# Patient Record
Sex: Female | Born: 1940
Health system: Southern US, Community
[De-identification: ages and names within clinical notes are randomized; demographics above are authoritative.]

## PROBLEM LIST (undated history)

## (undated) DIAGNOSIS — H9319 Tinnitus, unspecified ear: Secondary | ICD-10-CM

## (undated) DIAGNOSIS — Z973 Presence of spectacles and contact lenses: Secondary | ICD-10-CM

## (undated) DIAGNOSIS — R002 Palpitations: Secondary | ICD-10-CM

## (undated) DIAGNOSIS — C50919 Malignant neoplasm of unspecified site of unspecified female breast: Secondary | ICD-10-CM

## (undated) DIAGNOSIS — I10 Essential (primary) hypertension: Secondary | ICD-10-CM

## (undated) HISTORY — PX: TEAR DUCT PROBING: SHX793

## (undated) HISTORY — PX: CHOLECYSTECTOMY: SHX55

## (undated) HISTORY — DX: Essential (primary) hypertension: I10

## (undated) HISTORY — PX: BREAST LUMPECTOMY: SHX2

## (undated) HISTORY — PX: BREAST SURGERY: SHX581

## (undated) HISTORY — DX: Malignant neoplasm of unspecified site of unspecified female breast: C50.919

## (undated) HISTORY — DX: Presence of spectacles and contact lenses: Z97.3

## (undated) HISTORY — DX: Palpitations: R00.2

## (undated) HISTORY — DX: Tinnitus, unspecified ear: H93.19

---

## 1997-10-04 ENCOUNTER — Other Ambulatory Visit: Admission: RE | Admit: 1997-10-04 | Discharge: 1997-10-04 | Payer: Self-pay | Admitting: Obstetrics and Gynecology

## 1998-11-01 ENCOUNTER — Other Ambulatory Visit: Admission: RE | Admit: 1998-11-01 | Discharge: 1998-11-01 | Payer: Self-pay | Admitting: Obstetrics and Gynecology

## 2000-02-20 ENCOUNTER — Other Ambulatory Visit: Admission: RE | Admit: 2000-02-20 | Discharge: 2000-02-20 | Payer: Self-pay | Admitting: Obstetrics and Gynecology

## 2001-03-31 ENCOUNTER — Other Ambulatory Visit: Admission: RE | Admit: 2001-03-31 | Discharge: 2001-03-31 | Payer: Self-pay | Admitting: Obstetrics and Gynecology

## 2002-04-14 ENCOUNTER — Other Ambulatory Visit: Admission: RE | Admit: 2002-04-14 | Discharge: 2002-04-14 | Payer: Self-pay | Admitting: Obstetrics and Gynecology

## 2002-07-19 HISTORY — PX: CARDIOVASCULAR STRESS TEST: SHX262

## 2003-05-04 ENCOUNTER — Other Ambulatory Visit: Admission: RE | Admit: 2003-05-04 | Discharge: 2003-05-04 | Payer: Self-pay | Admitting: Obstetrics and Gynecology

## 2004-04-05 ENCOUNTER — Ambulatory Visit (HOSPITAL_COMMUNITY): Admission: RE | Admit: 2004-04-05 | Discharge: 2004-04-05 | Payer: Self-pay | Admitting: Gastroenterology

## 2004-05-06 ENCOUNTER — Other Ambulatory Visit: Admission: RE | Admit: 2004-05-06 | Discharge: 2004-05-06 | Payer: Self-pay | Admitting: Obstetrics and Gynecology

## 2005-06-02 ENCOUNTER — Other Ambulatory Visit: Admission: RE | Admit: 2005-06-02 | Discharge: 2005-06-02 | Payer: Self-pay | Admitting: Obstetrics and Gynecology

## 2007-01-29 ENCOUNTER — Inpatient Hospital Stay (HOSPITAL_BASED_OUTPATIENT_CLINIC_OR_DEPARTMENT_OTHER): Admission: RE | Admit: 2007-01-29 | Discharge: 2007-01-29 | Payer: Self-pay | Admitting: Cardiology

## 2007-01-29 HISTORY — PX: CARDIAC CATHETERIZATION: SHX172

## 2008-12-22 ENCOUNTER — Encounter: Admission: RE | Admit: 2008-12-22 | Discharge: 2008-12-22 | Payer: Self-pay | Admitting: Otolaryngology

## 2009-07-23 ENCOUNTER — Encounter: Admission: RE | Admit: 2009-07-23 | Discharge: 2009-07-23 | Payer: Self-pay | Admitting: Otolaryngology

## 2010-01-04 ENCOUNTER — Ambulatory Visit: Payer: Self-pay | Admitting: Cardiovascular Disease

## 2010-05-26 DIAGNOSIS — C50919 Malignant neoplasm of unspecified site of unspecified female breast: Secondary | ICD-10-CM

## 2010-05-26 HISTORY — DX: Malignant neoplasm of unspecified site of unspecified female breast: C50.919

## 2010-08-07 ENCOUNTER — Other Ambulatory Visit: Payer: Self-pay | Admitting: Otolaryngology

## 2010-08-07 DIAGNOSIS — E041 Nontoxic single thyroid nodule: Secondary | ICD-10-CM

## 2010-08-12 ENCOUNTER — Ambulatory Visit
Admission: RE | Admit: 2010-08-12 | Discharge: 2010-08-12 | Disposition: A | Discharge status: Home / Self Care | Payer: Medicare Other | Source: Ambulatory Visit | Attending: Otolaryngology | Admitting: Otolaryngology

## 2010-08-12 DIAGNOSIS — E041 Nontoxic single thyroid nodule: Secondary | ICD-10-CM

## 2010-09-26 ENCOUNTER — Ambulatory Visit: Payer: Self-pay | Admitting: *Deleted

## 2010-10-08 NOTE — Cardiovascular Report (Signed)
NAMEAEON, KOORS NO.:  [id number]   MEDICAL RECORD NO.:  [id number]          PATIENT TYPE:  OIB   LOCATION:  1965                         FACILITY:  [facility]   PHYSICIAN:  Colleen Can. Deborah Chalk, M.D.DATE OF BIRTH:  [birth year]   DATE OF PROCEDURE:  01/29/2007  DATE OF DISCHARGE:                            CARDIAC CATHETERIZATION   PROCEDURE:  Left heart catheterization with selective coronary  angiography and left ventricular angiography.   TYPE/SITE OF ENTRY:  Percutaneous right femoral artery.   CATHETERS:  4-French, four curved Judkins right and left coronary  catheters; 4-French, pigtail ventriculographic catheter.   CONTRAST MATERIAL:  Omnipaque.   COMMENTS:  The patient tolerated the procedure well.   HEMODYNAMIC DATA:  The aortic pressure was 136/62, LV is 137/2-4.  There  is no aortic valve gradient noted on pullback.   ANGIOGRAPHIC DATA:  Left main coronary artery is normal.  There is  normal global function, regional wall motion and cardiac size.  The  global ejection fraction was estimated at 60-65%.   CORONARY ARTERIES:  Coronary arteries arise and distribute normally.  1. Left main coronary artery is short.  2. The left anterior descending is normal.  It is a somewhat tortuous      vessel with high first and second diagonal vessels.  There is no      significant focal obstructive disease.  3. The left circumflex appears to be a dual ostium with the left      anterior descending.  It is normal.  It essentially trifurcates      with two large obtuse marginal branches.  4. Right coronary artery.  The right coronary artery is congenitally      small.  It is normal.  5. Abdominal aortogram shows normal distal abdominal aorta.  There is      normal renal arteries.   IMPRESSION:  1. Normal left ventricular function.  2. Normal coronary arteries with congenitally nondominant right      coronary artery.  3. It is a normal abdominal  aortogram.      Colleen Can. Deborah Chalk, M.D.  Electronically Signed     SNT/MEDQ  D:  01/29/2007  T:  01/29/2007  Job:  [id number]

## 2010-10-08 NOTE — H&P (Signed)
Deborah Lowe, Deborah Lowe NO.:  [id number]   MEDICAL RECORD NO.:  [id number]           PATIENT TYPE:   LOCATION:                                 FACILITY:   PHYSICIAN:  Colleen Can. Deborah Chalk, M.D.DATE OF BIRTH:  11-12-1940   DATE OF ADMISSION:  01/29/2007  DATE OF DISCHARGE:                              HISTORY & PHYSICAL   CHIEF COMPLAINT:  Chest discomfort.   HISTORY OF PRESENT ILLNESS:  Deborah Lowe is a 70 year old white female who  has a history of palpitations as well as labile hypertension.  She has  also had a vague fullness in the anterior chest, but has continued to be  active in taking care of her house and her yard.  She was referred for  stress echocardiogram which was performed on January 27, 2007.  With  this, she demonstrated reduced exercise tolerance.  She was only able to  exercise for approximately 5 minutes on the standard Bruce protocol to a  maximum of 2.5 miles per hour a 12% grade.  Her blood pressure rose to  155/70.  Her heart rate rose to 154.  Clinically, she had complaints of  fatigue.  She was noted to have diffuse ST depression as well as  occasional PVCs.  Echocardiographically, she demonstrated LVH.  She is  now referred for cardiac catheterization.   PAST MEDICAL HISTORY:  1. Palpitations.  2. Labile hypertension.  3. History of pneumonia.  4. Previous surgery for a clogged tear duct in the right eye.  5. Chest discomfort.  6. History of cholecystectomy over 30 years ago.  7. Childbirth times one.  8. Hyperlipidemia.   ALLERGIES:  Avelox.   CURRENT MEDICATIONS:  1. Toprol XL 25 mg a day.  2. Centrum vitamin daily.   FAMILY HISTORY:  Father died at 49 with heart disease.  Mother died at  40 with hypertension.  She has two sisters and one brother.  One brother  has had hypertension as well.   SOCIAL HISTORY:  She lives at home with her husband.  She is a  [employer] trader.   REVIEW OF SYSTEMS:  As noted above and is otherwise  unremarkable.   PHYSICAL EXAMINATION:  GENERAL:  She is a pleasant white female in no  acute distress.  VITAL SIGNS:  Blood pressure was 140/96, weight was 118 pounds, heart  rate 62.  HEENT:  Exam was unremarkable.  NECK: Supple.  LUNGS: Clear.  HEART: Showed regular rhythm.  ABDOMEN: Soft.  EXTREMITIES:  Without edema.  NEUROLOGIC:  Showed no gross focal deficit.   PERTINENT LABORATORY DATA:  Previous cholesterol levels showed total  cholesterol of 211, LDL was 130, HDL was 64.  Chest x-ray showed no  active disease.  Other labs are pending.   OVERALL IMPRESSION:  1. Abnormal stress echocardiogram with positive EKG changes.  2. Hypertension.  She does have mild LVH on 2-D echocardiogram.  3. History of palpitations.   PLAN:  Will proceed on with diagnostic catheterization.  The procedure,  risks and benefits have all been explained, and she is willing to  proceed on January 29, 2007.      Sharlee Blew, N.P.      Colleen Can. Deborah Chalk, M.D.  Electronically Signed    LC/MEDQ  D:  01/27/2007  T:  01/28/2007  Job:  [id number]   cc:   Colon Flattery, D.O.

## 2010-10-11 NOTE — Op Note (Signed)
Deborah Lowe, Deborah Lowe                ACCOUNT NO.:  [id number]   MEDICAL RECORD NO.:  [id number]          PATIENT TYPE:  AMB   LOCATION:  ENDO                         FACILITY:  [facility]   PHYSICIAN:  James L. Malon Kindle., M.D.DATE OF BIRTH:  1940-09-30   DATE OF PROCEDURE:  04/05/2004  DATE OF DISCHARGE:                                 OPERATIVE REPORT   PROCEDURE:  Colonoscopy.   MEDICATIONS:  1.  Fentanyl 75 mcg.  2.  Versed 7 mg IV.   SCOPE:  Olympus pediatric adjustable colonoscope.   INDICATION:  Strong family history of colon cancer.  Mother died of colon  cancer.  Sister has had colon polyps.   DESCRIPTION OF PROCEDURE:  The procedure had been explained to the patient  and consent obtained.  With the patient in left lateral decubitus position,  the Olympus scope was inserted and advanced.  The prep was excellent.  We  were able to easily advance to the cecum.  The ileocecal valve and  appendiceal orifice were seen.  The scope was withdrawn, and the cecum,  ascending colon, transverse colon, descending and sigmoid colon were seen  well.  There was minimal diverticular disease.  The rectum was free of  polyps.  The scope was withdrawn.  The patient tolerated the procedure well.   ASSESSMENT:  Family history of colon cancer.  Negative colonoscopy. V16.0.   PLAN:  We will repeat colonoscopy in 5 years.  Recommend yearly Hemoccults.      JLE/MEDQ  D:  04/05/2004  T:  04/05/2004  Job:  [id number]   cc:   Gloriajean Dell. Andrey Campanile, M.D.  [address]  [place]  [place] [address]  Fax: [phone]

## 2010-12-04 ENCOUNTER — Other Ambulatory Visit: Payer: Self-pay | Admitting: Radiology

## 2010-12-05 ENCOUNTER — Other Ambulatory Visit: Payer: Self-pay | Admitting: Radiology

## 2010-12-05 DIAGNOSIS — C50912 Malignant neoplasm of unspecified site of left female breast: Secondary | ICD-10-CM

## 2010-12-09 ENCOUNTER — Ambulatory Visit
Admission: RE | Admit: 2010-12-09 | Discharge: 2010-12-09 | Disposition: A | Discharge status: Home / Self Care | Payer: Medicare Other | Source: Ambulatory Visit | Attending: Radiology | Admitting: Radiology

## 2010-12-09 DIAGNOSIS — C50912 Malignant neoplasm of unspecified site of left female breast: Secondary | ICD-10-CM

## 2010-12-09 MED ORDER — GADOBENATE DIMEGLUMINE 529 MG/ML IV SOLN: 10.0000 mL | Freq: Once | INTRAVENOUS | Status: AC | PRN

## 2010-12-11 ENCOUNTER — Encounter (HOSPITAL_BASED_OUTPATIENT_CLINIC_OR_DEPARTMENT_OTHER): Payer: Medicare Other | Admitting: Oncology

## 2010-12-11 ENCOUNTER — Other Ambulatory Visit: Payer: Self-pay | Admitting: Oncology

## 2010-12-11 ENCOUNTER — Encounter (INDEPENDENT_AMBULATORY_CARE_PROVIDER_SITE_OTHER): Payer: Self-pay | Admitting: General Surgery

## 2010-12-11 ENCOUNTER — Ambulatory Visit (HOSPITAL_BASED_OUTPATIENT_CLINIC_OR_DEPARTMENT_OTHER): Payer: Medicare Other | Admitting: General Surgery

## 2010-12-11 VITALS — BP 188/77 | HR 66 | Temp 97.9°F | Resp 20 | Ht 61.0 in | Wt 116.0 lb

## 2010-12-11 DIAGNOSIS — C50919 Malignant neoplasm of unspecified site of unspecified female breast: Secondary | ICD-10-CM

## 2010-12-11 DIAGNOSIS — C50419 Malignant neoplasm of upper-outer quadrant of unspecified female breast: Secondary | ICD-10-CM

## 2010-12-11 DIAGNOSIS — D0590 Unspecified type of carcinoma in situ of unspecified breast: Secondary | ICD-10-CM

## 2010-12-11 DIAGNOSIS — C50912 Malignant neoplasm of unspecified site of left female breast: Secondary | ICD-10-CM

## 2010-12-11 LAB — CBC WITH DIFFERENTIAL/PLATELET
EOS%: 1.1 % (ref 0.0–7.0)
Eosinophils Absolute: 0.1 10*3/uL (ref 0.0–0.5)
LYMPH%: 35.4 % (ref 14.0–49.7)
MCHC: 34.3 g/dL (ref 31.5–36.0)
MONO#: 0.5 10*3/uL (ref 0.1–0.9)
NEUT%: 54.3 % (ref 38.4–76.8)
RBC: 4.65 10*6/uL (ref 3.70–5.45)
RDW: 12.6 % (ref 11.2–14.5)
WBC: 5.6 10*3/uL (ref 3.9–10.3)
lymph#: 2 10*3/uL (ref 0.9–3.3)

## 2010-12-11 LAB — COMPREHENSIVE METABOLIC PANEL
ALT: 15 U/L (ref 0–35)
Albumin: 4.1 g/dL (ref 3.5–5.2)
Calcium: 9.8 mg/dL (ref 8.4–10.5)
Chloride: 102 mEq/L (ref 96–112)
Total Protein: 7.4 g/dL (ref 6.0–8.3)

## 2010-12-11 LAB — CANCER ANTIGEN 27.29: CA 27.29: 31 U/mL (ref 0–39)

## 2010-12-11 NOTE — Patient Instructions (Addendum)
My office will call you Within 24 hours to scheduled your surgery. We plan a left partial mastectomy with needle localization, left axillary sentinel node biopsy. He will followup with Dr. Welton Flakes and Dr. Dayton Scrape postoperatively.

## 2010-12-11 NOTE — Progress Notes (Signed)
Subjective:     Patient ID: Deborah Lowe, female   DOB: September 07, 1940, 70 y.o.   MRN: [id number]  HPI This is a 70 year old Caucasian female, referred to me by Dr. Latricia Heft and so was women's health for evaluation of a newly diagnosed invasive cancer of the left breast, upper outer quadrant.  The patient has never had a breast problem before. She gets annual mammograms. Recent mammograms show some linear calcifications in the upper outer quadrant of the left breast which are pleomorphic. Initially she was felt to have a right breast density but on followup films that did not persist. Image guided biopsy of the left breast density was performed, and the pathology report shows in situ carcinoma and invasive carcinoma which is receptor positive, HER-2-negative, and with the Ki 67 of 60%.  The MRI shows this to be a solitary density which enhances at 1.5 cm diameter.  She is being seen in the breasts small to this for a clinic by Dr. Park Breed, Dr. Dayton Scrape, and me today. We have discussed her imaging and pathologic findings in our breast conference this morning.  Review of Systems  Constitutional: Negative.   HENT: Negative.   Eyes: Negative.   Respiratory: Negative.   Cardiovascular: Negative.   Gastrointestinal: Negative.   Genitourinary: Negative.   Musculoskeletal: Negative.   Neurological: Negative.   Hematological: Negative.   Psychiatric/Behavioral: Negative.        Past Medical History  Diagnosis Date  . Hypertension   . Wears glasses   . Ringing in ears   . Palpitations    Current outpatient prescriptions:losartan (COZAAR) 100 MG tablet, Take 100 mg by mouth daily.  , Disp: , Rfl: ;  metoprolol succinate (TOPROL-XL) 25 MG 24 hr tablet, Take 25 mg by mouth daily.  , Disp: , Rfl: ;  Multiple Vitamins-Minerals (MULTIVITAMIN WITH MINERALS) tablet, Take 1 tablet by mouth daily.  , Disp: , Rfl:  Allergies  Allergen Reactions  . Avelox (Moxifloxacin Hcl In Nacl)    Family History    Problem Relation Age of Onset  . Cancer Mother     colon   History  Substance Use Topics  . Smoking status: Never Smoker   . Smokeless tobacco: Never Used  . Alcohol Use: No   Objective:   Physical Exam  Constitutional: She appears well-developed and well-nourished. No distress.  HENT:  Head: Normocephalic and atraumatic.  Mouth/Throat: No oropharyngeal exudate.  Eyes: Conjunctivae are normal. Pupils are equal, round, and reactive to light. Left eye exhibits no discharge. No scleral icterus.  Neck: Normal range of motion. Neck supple. No JVD present. No thyromegaly present.  Cardiovascular: Normal rate, regular rhythm and normal heart sounds.  Exam reveals no gallop.   No murmur heard. Pulmonary/Chest: Breath sounds normal. No respiratory distress. She has no wheezes. She has no rales. She exhibits no tenderness.    Abdominal: Soft. Bowel sounds are normal. She exhibits no distension and no mass. There is no tenderness. There is no rebound and no guarding.  Musculoskeletal: Normal range of motion. She exhibits no tenderness.  Lymphadenopathy:    She has no cervical adenopathy.  Neurological: She exhibits normal muscle tone.  Skin: Skin is warm and dry. No rash noted. No erythema.  Psychiatric: She has a normal mood and affect. Her behavior is normal. Judgment and thought content normal.       Assessment:     Invasive ductal carcinoma left breast, upper outer quadrant, receptor positive, HER-2-negative, Ki 67 60%.  Clinical stage is a T1a., N0 at least, and a T1c., N0 at most.  She is a very good candidate for breast conservation surgery, and she would like to avoid mastectomy if possible.    Plan:     The patient will be scheduled for left partial mastectomy with needle localization, left axillary sentinel node biopsy.  I have discussed discussed the indications and details of surgery with her and her family. Risks and complications have been outlined, including but not  limited to bleeding, infection, reoperation for positive margins, reoperation for multiple positive nodes, cosmetic deformity, skin necrosis, and other unforeseen problems. At this time all of her questions are answered. She seems to understand these issues well. She is in full agreement with this plan.  Our office will schedule her surgery in the near future.  She will followup with Dr. Welton Flakes, and and Dr. Dayton Scrape  postop.

## 2010-12-12 ENCOUNTER — Other Ambulatory Visit (INDEPENDENT_AMBULATORY_CARE_PROVIDER_SITE_OTHER): Payer: Self-pay | Admitting: General Surgery

## 2010-12-12 DIAGNOSIS — C50912 Malignant neoplasm of unspecified site of left female breast: Secondary | ICD-10-CM

## 2010-12-20 ENCOUNTER — Other Ambulatory Visit (INDEPENDENT_AMBULATORY_CARE_PROVIDER_SITE_OTHER): Payer: Self-pay | Admitting: General Surgery

## 2010-12-20 ENCOUNTER — Ambulatory Visit (HOSPITAL_COMMUNITY)
Admission: RE | Admit: 2010-12-20 | Discharge: 2010-12-20 | Disposition: A | Discharge status: Home / Self Care | Payer: Medicare Other | Source: Ambulatory Visit | Attending: General Surgery | Admitting: General Surgery

## 2010-12-20 ENCOUNTER — Encounter (HOSPITAL_COMMUNITY)
Admission: RE | Admit: 2010-12-20 | Discharge: 2010-12-20 | Disposition: A | Discharge status: Home / Self Care | Payer: Medicare Other | Source: Ambulatory Visit | Attending: General Surgery | Admitting: General Surgery

## 2010-12-20 ENCOUNTER — Telehealth: Payer: Self-pay | Admitting: Nurse Practitioner

## 2010-12-20 DIAGNOSIS — C50919 Malignant neoplasm of unspecified site of unspecified female breast: Secondary | ICD-10-CM | POA: Insufficient documentation

## 2010-12-20 DIAGNOSIS — Z01818 Encounter for other preprocedural examination: Secondary | ICD-10-CM | POA: Insufficient documentation

## 2010-12-20 DIAGNOSIS — C50912 Malignant neoplasm of unspecified site of left female breast: Secondary | ICD-10-CM

## 2010-12-20 DIAGNOSIS — I10 Essential (primary) hypertension: Secondary | ICD-10-CM | POA: Insufficient documentation

## 2010-12-20 DIAGNOSIS — Z01810 Encounter for preprocedural cardiovascular examination: Secondary | ICD-10-CM | POA: Insufficient documentation

## 2010-12-20 DIAGNOSIS — Z01812 Encounter for preprocedural laboratory examination: Secondary | ICD-10-CM | POA: Insufficient documentation

## 2010-12-20 LAB — SURGICAL PCR SCREEN
MRSA, PCR: NEGATIVE
Staphylococcus aureus: NEGATIVE

## 2010-12-20 LAB — COMPREHENSIVE METABOLIC PANEL
ALT: 15 U/L (ref 0–35)
AST: 14 U/L (ref 0–37)
Albumin: 4.3 g/dL (ref 3.5–5.2)
BUN: 10 mg/dL (ref 6–23)
CO2: 29 mEq/L (ref 19–32)
Chloride: 104 mEq/L (ref 96–112)
Creatinine, Ser: 0.58 mg/dL (ref 0.50–1.10)
GFR calc Af Amer: >60 mL/min (ref >60)
GFR calc non Af Amer: >60 mL/min (ref >60)
Sodium: 142 mEq/L (ref 135–145)
Total Bilirubin: 1 mg/dL (ref 0.3–1.2)

## 2010-12-20 LAB — URINALYSIS, ROUTINE W REFLEX MICROSCOPIC
Glucose, UA: NEGATIVE mg/dL
Nitrite: NEGATIVE
Specific Gravity, Urine: 1.01 (ref 1.005–1.030)
Urobilinogen, UA: 0.2 mg/dL (ref 0.0–1.0)

## 2010-12-20 LAB — URINE MICROSCOPIC-ADD ON

## 2010-12-20 LAB — CBC
MCV: 85.9 fL (ref 78.0–100.0)
Platelets: 264 10*3/uL (ref 150–400)

## 2010-12-20 LAB — DIFFERENTIAL
Eosinophils Relative: 1 % (ref 0–5)
Lymphocytes Relative: 40 % (ref 12–46)
Lymphs Abs: 2.7 10*3/uL (ref 0.7–4.0)
Monocytes Relative: 7 % (ref 3–12)
Neutro Abs: 3.6 10*3/uL (ref 1.7–7.7)

## 2010-12-20 NOTE — Telephone Encounter (Signed)
Thurston Hole from [facility] Short Stay requests latest ov notes, ekg, and other tests to be faxed to [phone]

## 2010-12-23 NOTE — Telephone Encounter (Signed)
Already faxed.

## 2010-12-25 ENCOUNTER — Ambulatory Visit (HOSPITAL_COMMUNITY)
Admission: RE | Admit: 2010-12-25 | Discharge: 2010-12-25 | Disposition: A | Discharge status: Home / Self Care | Payer: Medicare Other | Source: Ambulatory Visit | Attending: General Surgery | Admitting: General Surgery

## 2010-12-25 ENCOUNTER — Other Ambulatory Visit (INDEPENDENT_AMBULATORY_CARE_PROVIDER_SITE_OTHER): Payer: Self-pay | Admitting: General Surgery

## 2010-12-25 DIAGNOSIS — Z01812 Encounter for preprocedural laboratory examination: Secondary | ICD-10-CM | POA: Insufficient documentation

## 2010-12-25 DIAGNOSIS — I10 Essential (primary) hypertension: Secondary | ICD-10-CM | POA: Insufficient documentation

## 2010-12-25 DIAGNOSIS — Z170 Estrogen receptor positive status [ER+]: Secondary | ICD-10-CM | POA: Insufficient documentation

## 2010-12-25 DIAGNOSIS — C50419 Malignant neoplasm of upper-outer quadrant of unspecified female breast: Secondary | ICD-10-CM | POA: Insufficient documentation

## 2010-12-25 DIAGNOSIS — Z01810 Encounter for preprocedural cardiovascular examination: Secondary | ICD-10-CM | POA: Insufficient documentation

## 2010-12-25 DIAGNOSIS — C50912 Malignant neoplasm of unspecified site of left female breast: Secondary | ICD-10-CM

## 2010-12-25 DIAGNOSIS — Z01818 Encounter for other preprocedural examination: Secondary | ICD-10-CM | POA: Insufficient documentation

## 2010-12-25 DIAGNOSIS — D0590 Unspecified type of carcinoma in situ of unspecified breast: Secondary | ICD-10-CM

## 2010-12-25 MED ORDER — TECHNETIUM TC 99M SULFUR COLLOID FILTERED
1.0000 | Freq: Once | INTRAVENOUS | Status: AC | PRN
  Administered 2010-12-25: 1 via INTRADERMAL

## 2010-12-26 ENCOUNTER — Telehealth (INDEPENDENT_AMBULATORY_CARE_PROVIDER_SITE_OTHER): Payer: Self-pay

## 2010-12-26 NOTE — Telephone Encounter (Signed)
Pt home doing well. Po appt made. Pt to call with concerns. 

## 2010-12-26 NOTE — Op Note (Signed)
NAMEGILBERTA, Deborah Lowe NO.:  [id number]  MEDICAL RECORD NO.:  [id number]  LOCATION:  SDSC                         FACILITY:  [facility]  PHYSICIAN:  Angelia Mould. Derrell Lolling, M.D.DATE OF BIRTH:  [birth year]  DATE OF PROCEDURE:  12/25/2010 DATE OF DISCHARGE:  12/25/2010                              OPERATIVE REPORT   PREOPERATIVE DIAGNOSIS:  Invasive ductal carcinoma of left breast, upper outer quadrant, receptor positive, HER2 negative, clinical stage T1b, N0.  POSTOPERATIVE DIAGNOSIS:  Invasive ductal carcinoma of left breast, upper outer quadrant, receptor positive, HER2 negative, clinical stage T1b, N0.  OPERATION PERFORMED: 1. Inject blue dye, left breast. 2. Left partial mastectomy with needle localization. 3. Left axillary sentinel lymph node mapping and biopsy.  SURGEON:  Angelia Mould. Derrell Lolling, MD  OPERATIVE INDICATIONS:  This is a 70 year old Caucasian female without any prior history of breast problems.  Recent screening mammograms showed calcifications in the upper outer quadrant of the left breast. Image-guided biopsy was performed and this shows invasive ductal carcinoma which is receptor positive, HER2 negative, and KI-67 60%.  MRI shows this to be a solid density no bigger than 1.5 cm.  She has been seen by Dr. Drue Second, Dr. Chipper Herb, and me.  We have advised her to proceed with surgery and it is her desire to have breast conservation.  She is brought to the operating room electively.  OPERATIVE TECHNIQUE:  The patient underwent wire localization of the left breast cancer by Dr. Jeralyn Ruths.  The wire went directly by the marker clip and went past it about 2 cm.  This was a good needle localization.  The patient was then brought to [facility] to the holding area.  The left breast was injected with radionuclide by the nuclear medicine technician.  The patient was taken to the operating room.  General anesthesia was induced.  The patient  was identified as correct patient, correct procedure, and correct site.  Following alcohol prep, I injected 5 mL of blue dye into the left breast, subareolar area.  This was 2 mL of methylene blue mixed with 3 mL of saline.  The breast was massaged for 5 minutes.  We then prepped and draped the left breast, left axilla, and chest wall. Intravenous antibiotics had been given.  Marcaine 0.5% with epinephrine was used a local infiltration anesthetic.  A curvilinear incision was made in the upper outer quadrant of the left breast, paralleling the areolar margin, but this was high in the upper outer quadrant.  This went through the wire insertion site.  Dissection was carried down into the breast tissue and I went widely around the wire.  The specimen was removed and marked with the 6-color margin marker kit.  Specimen mammogram showed that the wire and the marker clip were in the specimen and the marker clip appeared to be fairly central in the specimen.  This was sent to pathology for routine histology.  The lumpectomy site wasirrigated with saline.  Hemostasis was excellent and achieved with electrocautery.  The deeper breast tissues were closed with interrupted sutures of 3-0 Vicryl and the skin closed with a running subcuticular suture of 4-0 Monocryl and  Dermabond.  We then made a similar, somewhat parallel skin crease incision in the left axilla. Dissection was carried down through the subcutaneous tissue and through the clavipectoral fascia into the left axilla.  Using the NeoProbe, we isolated 2 sentinel lymph nodes.  Both of these were very hot and were very blue.  After these 2 lymph nodes were removed, there was no more radioactivity and so, we did not feel that there were any other sentinel nodes.  The axillary wound was irrigated with saline.  Hemostasis was excellent and achieved with electrocautery.  The deeper axillary tissues were closed with interrupted sutures of 3-0  Vicryl and the skin closed with a running subcuticular suture of 4-0 Monocryl and Dermabond.  An ice pack was placed and the patient was taken to the recovery room in stable condition.  Estimated blood loss was about 15-20 mL. Complications none.  Sponge, needle, and instrument counts were correct.     Angelia Mould. Derrell Lolling, M.D.     HMI/MEDQ  D:  12/25/2010  T:  12/26/2010  Job:  [id number]  cc:   Drue Second, M.D. Maryln Gottron, M.D. Rogelia Mire, MD  Electronically Signed by Claud Kelp M.D. on 12/26/2010 07:25:17 AM

## 2010-12-27 ENCOUNTER — Telehealth (INDEPENDENT_AMBULATORY_CARE_PROVIDER_SITE_OTHER): Payer: Self-pay | Admitting: General Surgery

## 2010-12-27 NOTE — Telephone Encounter (Signed)
Patient aware path not back yet and we will call her when we receive it.

## 2010-12-30 ENCOUNTER — Telehealth (INDEPENDENT_AMBULATORY_CARE_PROVIDER_SITE_OTHER): Payer: Self-pay | Admitting: General Surgery

## 2010-12-30 NOTE — Telephone Encounter (Signed)
pls call pt with test results

## 2010-12-30 NOTE — Telephone Encounter (Signed)
Pls call patient with rest results

## 2010-12-30 NOTE — Telephone Encounter (Signed)
Pt notified of path result. Pt has f/u appt 01-13-11.

## 2011-01-01 ENCOUNTER — Telehealth (INDEPENDENT_AMBULATORY_CARE_PROVIDER_SITE_OTHER): Payer: Self-pay

## 2011-01-01 NOTE — Telephone Encounter (Signed)
I have lmom for pt to call for path result. Per  HMI  path margins neg and nodes neg.

## 2011-01-02 ENCOUNTER — Telehealth (INDEPENDENT_AMBULATORY_CARE_PROVIDER_SITE_OTHER): Payer: Self-pay

## 2011-01-02 NOTE — Telephone Encounter (Signed)
Pt called and advised of path result. Pt has f/u appt.

## 2011-01-13 ENCOUNTER — Encounter (INDEPENDENT_AMBULATORY_CARE_PROVIDER_SITE_OTHER): Payer: Self-pay | Admitting: General Surgery

## 2011-01-13 ENCOUNTER — Ambulatory Visit (INDEPENDENT_AMBULATORY_CARE_PROVIDER_SITE_OTHER): Payer: Medicare Other | Admitting: General Surgery

## 2011-01-13 VITALS — BP 142/88 | HR 64 | Temp 98.1°F

## 2011-01-13 DIAGNOSIS — Z9889 Other specified postprocedural states: Secondary | ICD-10-CM

## 2011-01-13 NOTE — Patient Instructions (Signed)
All of your wounds appear to be healing well. There is a normal amount of swelling and tenderness. you will be referred for aleft shoulder physical therapy class. Keep your appointments with Dr. Drue Second and Dr. Chipper Herb. I will see you back in my office in 6 weeks.

## 2011-01-13 NOTE — Progress Notes (Signed)
Subjective:     Patient ID: Deborah Lowe, female   DOB: 01-07-1941, 70 y.o.   MRN: [id number]  HPI This 70 year old Caucasian female underwent left partial mastectomy and sentinel lobe biopsy recently. She had a ductal carcinoma in situ and one small area of microinvasion. The 2 sentinel nodes were negative. Margins are negative. ER strongly positive, PR-positive, HER-2 negative.  She is doing well. She is a little bit sore and range of motion of her left shoulder is not back to normal yet. She has an appointment to see Dr. Chipper Herb on August 21. She is to see Dr. Drue Second August 30.  Review of Systems     Objective:   Physical Exam Left breast incision and left axillary incision are healing uneventfully. They're very close to each other. Normal amount of thickening. No sign of any infection. Normal amount of tenderness. Range of motion of the left shoulder is about 80% of normal.    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 ER positive. PR positive. HER-2 negative. Pathologically T1a, N0 stage.  She is healing uneventfully.    Plan:     She will be referred to an ABC class for rehabilitation of her left shoulder.  Return to see me in 6 weeks.  She will seeDr. Chipper Herb and Dr. Drue Second in the next week.

## 2011-01-14 ENCOUNTER — Ambulatory Visit
Admission: RE | Admit: 2011-01-14 | Discharge: 2011-01-14 | Disposition: A | Discharge status: Home / Self Care | Payer: Medicare Other | Source: Ambulatory Visit | Attending: Radiation Oncology | Admitting: Radiation Oncology

## 2011-01-14 DIAGNOSIS — C50419 Malignant neoplasm of upper-outer quadrant of unspecified female breast: Secondary | ICD-10-CM | POA: Insufficient documentation

## 2011-01-14 DIAGNOSIS — L539 Erythematous condition, unspecified: Secondary | ICD-10-CM | POA: Insufficient documentation

## 2011-01-14 DIAGNOSIS — Z510 Encounter for antineoplastic radiation therapy: Secondary | ICD-10-CM | POA: Insufficient documentation

## 2011-01-23 ENCOUNTER — Encounter (HOSPITAL_BASED_OUTPATIENT_CLINIC_OR_DEPARTMENT_OTHER): Payer: Medicare Other | Admitting: Oncology

## 2011-01-23 DIAGNOSIS — Z170 Estrogen receptor positive status [ER+]: Secondary | ICD-10-CM

## 2011-01-23 DIAGNOSIS — C50919 Malignant neoplasm of unspecified site of unspecified female breast: Secondary | ICD-10-CM

## 2011-02-20 ENCOUNTER — Encounter (INDEPENDENT_AMBULATORY_CARE_PROVIDER_SITE_OTHER): Payer: Self-pay | Admitting: General Surgery

## 2011-02-20 ENCOUNTER — Ambulatory Visit (INDEPENDENT_AMBULATORY_CARE_PROVIDER_SITE_OTHER): Payer: Medicare Other | Admitting: General Surgery

## 2011-02-20 VITALS — BP 138/76 | HR 68 | Temp 98.1°F | Resp 16 | Ht 62.0 in | Wt 116.4 lb

## 2011-02-20 DIAGNOSIS — C50412 Malignant neoplasm of upper-outer quadrant of left female breast: Secondary | ICD-10-CM | POA: Insufficient documentation

## 2011-02-20 DIAGNOSIS — Z9889 Other specified postprocedural states: Secondary | ICD-10-CM

## 2011-02-20 DIAGNOSIS — C50419 Malignant neoplasm of upper-outer quadrant of unspecified female breast: Secondary | ICD-10-CM

## 2011-02-20 NOTE — Progress Notes (Signed)
Subjective:     Patient ID: Deborah Lowe, female   DOB: [birth year], 70 y.o.   MRN: [id number]  HPI  This 70 year old female returns for followup of her left breast cancer. On December 25, 2010 she underwent left partial mastectomy and sentinel node biopsy. Pathology showed ductal carcinoma in situ with microinvasion. 2 sentinel lymph nodes were negative. The tumor was receptor positive, HER-2/neu negative, Ki-67 60%.  She feels that she has healed her incisions well. She has started her radiation therapy under the guidance of Dr. Chipper Herb. She is anticipating antiestrogen therapy once the radiation therapy is completed. Review of Systems     Objective:   Physical Exam Patient looks well. She is in no distress. The incision in the breast and axilla are healing very nicely. No hematoma. No fluid collection. No signs of infection. She has full range of motion of her left shoulder. There is no numbness.    Assessment:     Invasive carcinoma left breast, upper outer quadrant, receptor positive, HER-2-negative, Ki-67 60%. Doing well 8 weeks following left partial mastectomy and sentinel lobe biopsy.    Plan:     Continue and complete adjuvant radiation therapy.  Anticipate she will be placed on antiestrogen therapy at some point.  Return to see me in 5 months.

## 2011-02-20 NOTE — Patient Instructions (Signed)
The incisions in your left breast and axilla have healed nicely. Continue followup with Dr. Chipper Herb and Dr. Drue Second. you will be scheduled to see me in 5 months.

## 2011-03-02 IMAGING — US US SOFT TISSUE HEAD/NECK
1 series · 14 of 25 positions shown · non-contrast
Comparison: None

CLINICAL DATA: Left thyroid nodule on physical examination.

THYROID ULTRASOUND
TECHNIQUE: Ultrasound examination of the thyroid gland and
adjacent soft tissues was performed.

[Series 1: us soft tissue head/neck · 0.12mm/px · 14 of 40 slices shown]
[im 1/40]
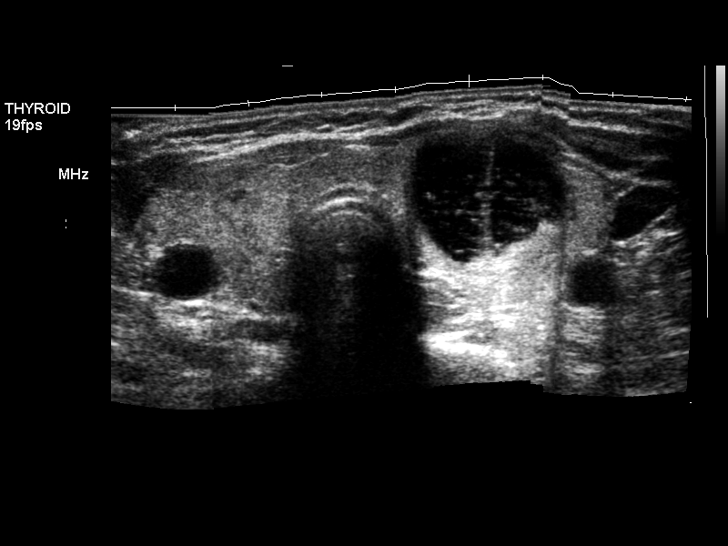
[im 4/40]
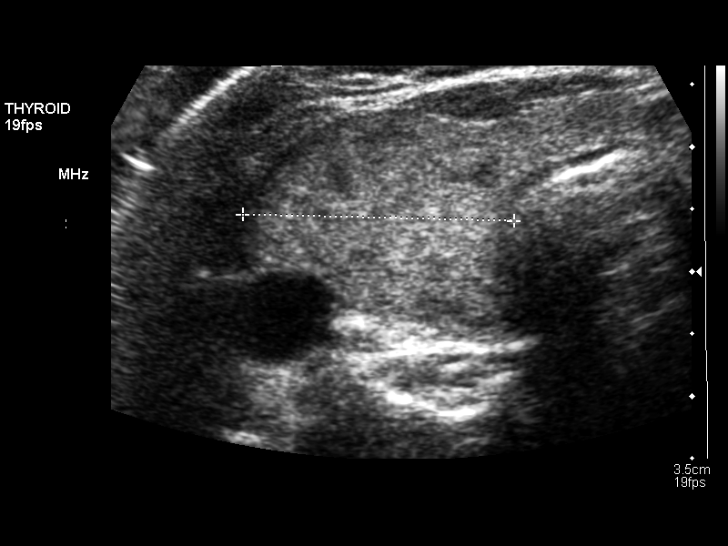
[im 7/40]
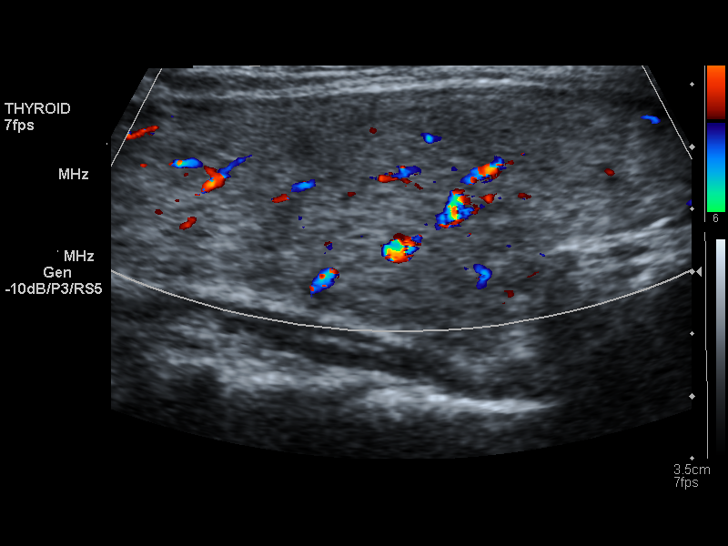
[im 10/40]
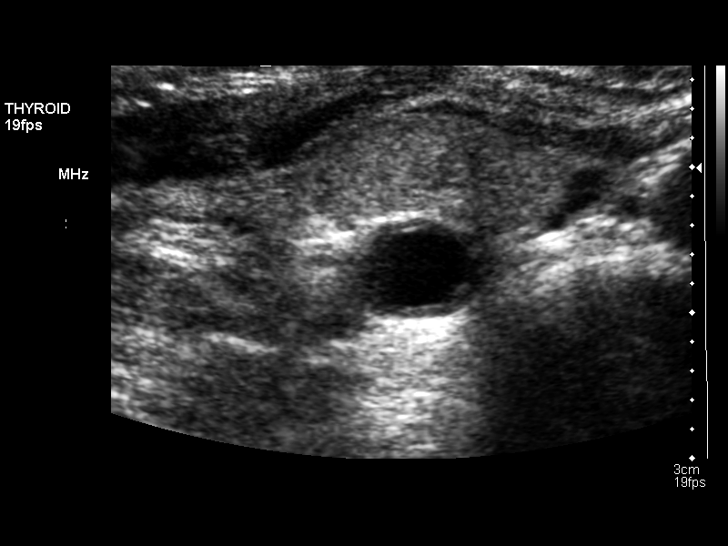
[im 14/40]
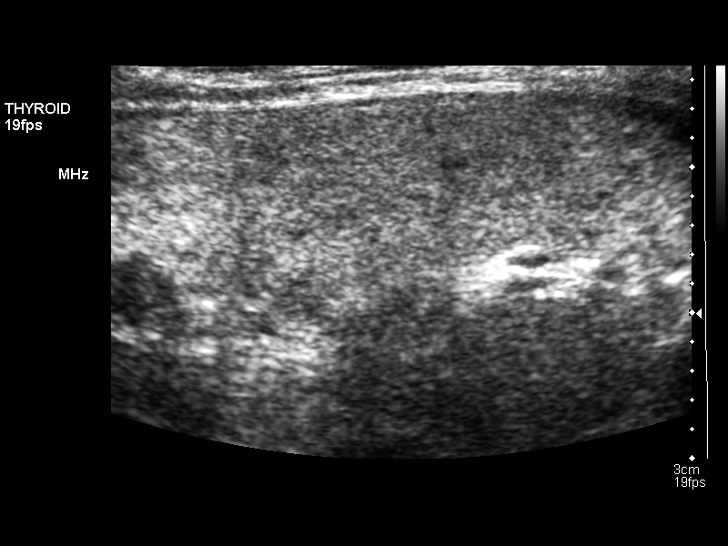
[im 15/40]
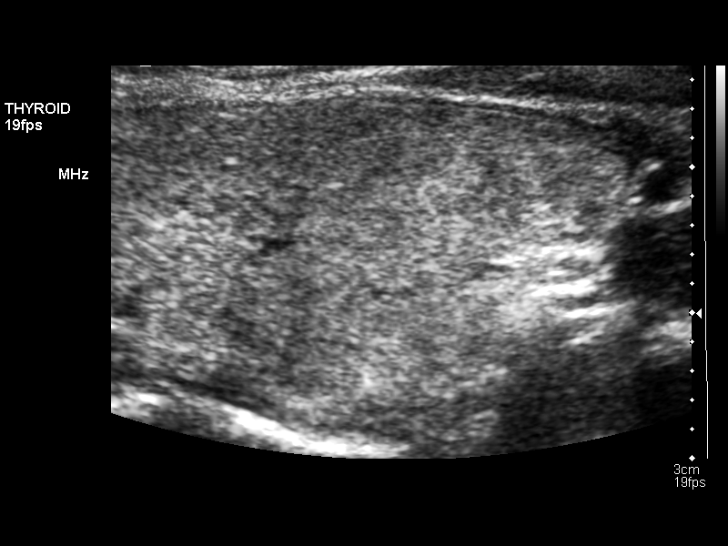
[im 18/40]
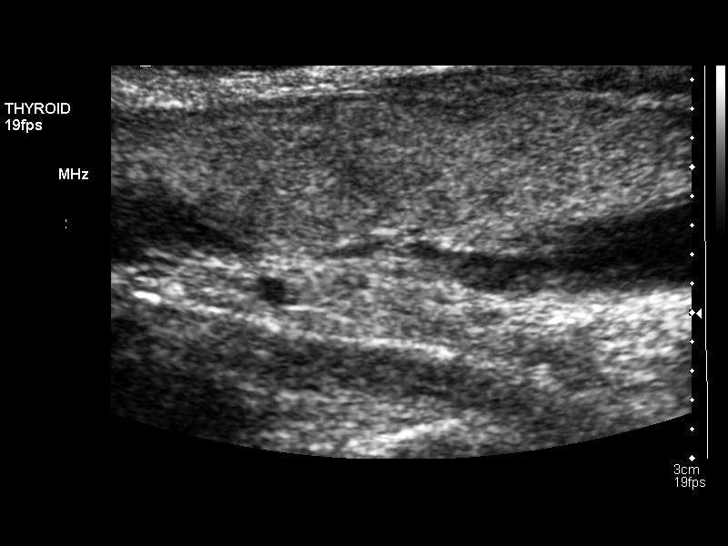
[im 22/40]
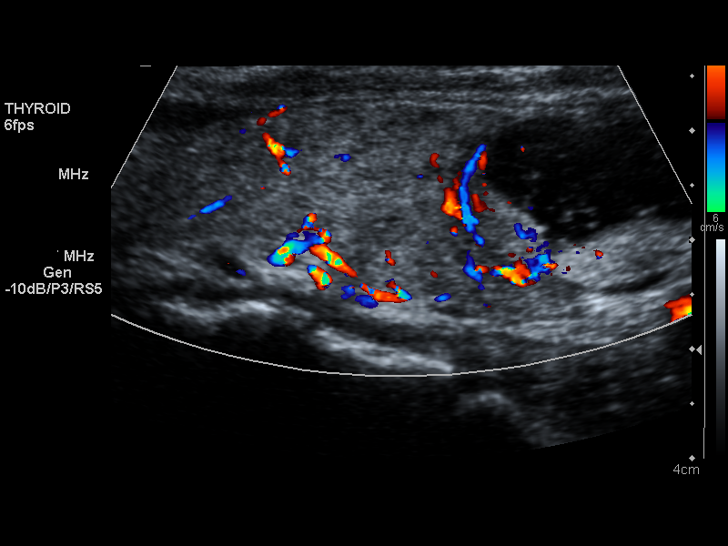
[im 25/40]
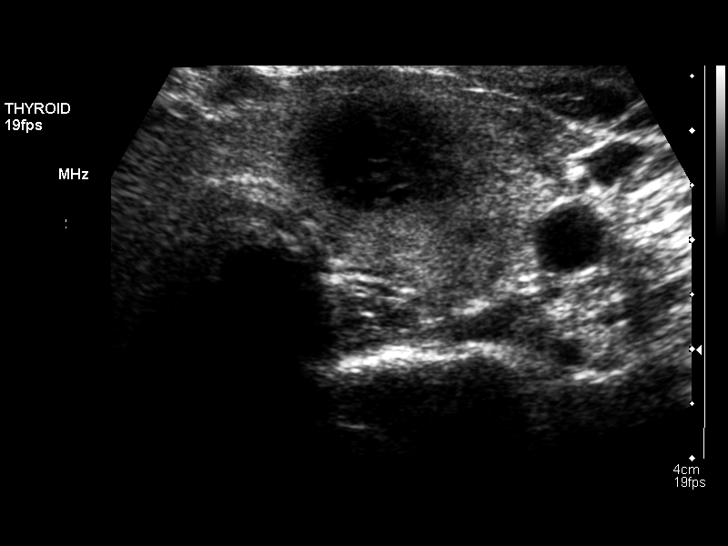
[im 27/40]
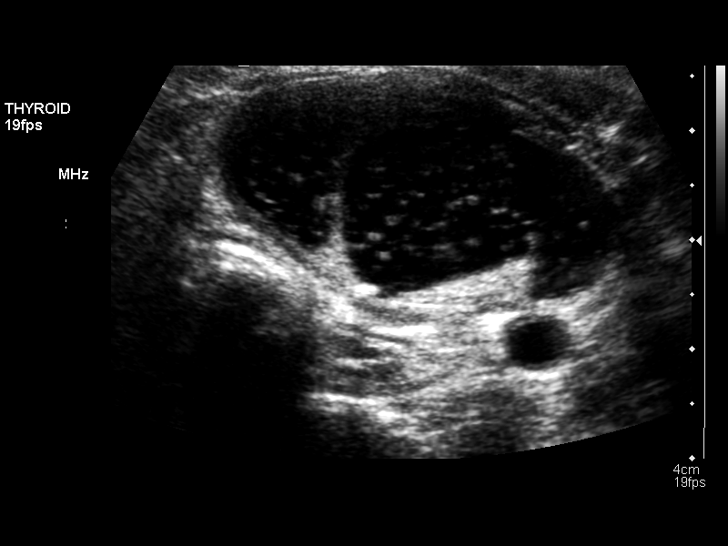
[im 30/40]
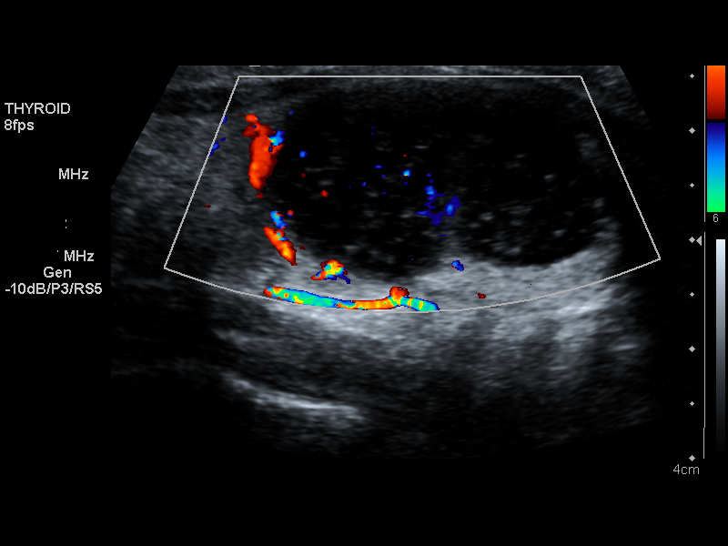
[im 33/40]
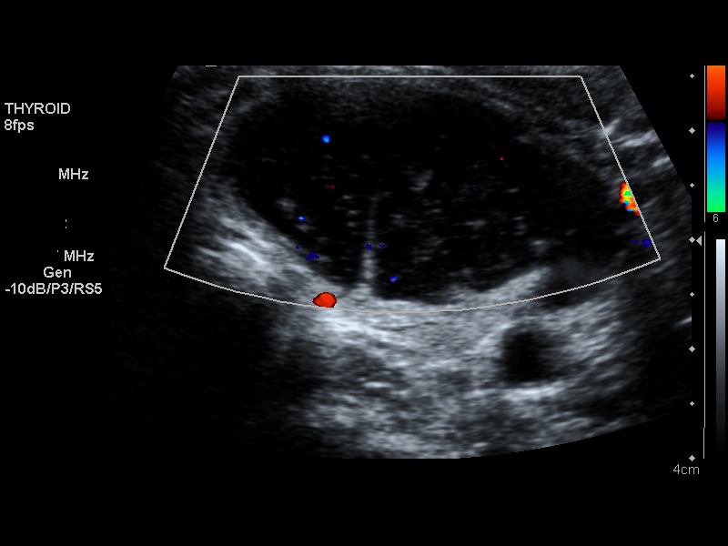
[im 36/40]
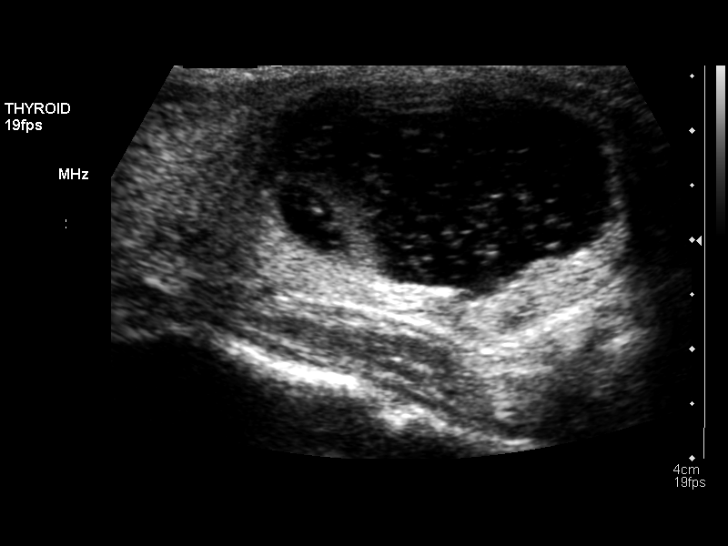
[im 40/40]
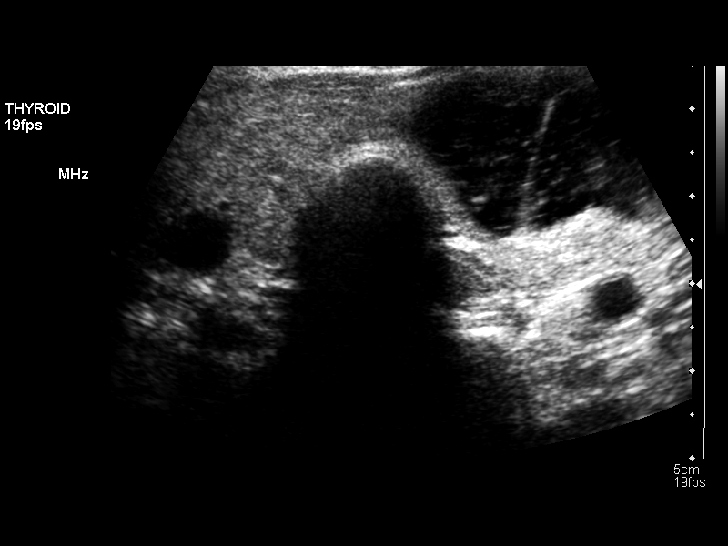

[14 of 25 positions shown; findings below may reference images not displayed]

FINDINGS: The thyroid echotexture is slightly heterogeneous.  The
right lobe measures 5.4 x 2.0 x 2.2 cm.  The left lobe measures
x 2.3 x 3.0 cm.

There are no suspicious findings in the right lobe.  A 3 mm
hypoechoic nodule is noted.  Inferiorly in the left lobe is a
dominant complex primarily cystic structure that measures 3.8 x
x 3.3 cm.  Central echogenic debris in this cyst demonstrates
motion on real time imaging.  Surrounding the cystic component are
possible thin solid components or compressed thyroid tissue with
some associated hypervascularity.
IMPRESSION: 1.  Dominant complex primarily cystic lesion in the left thyroid
lobe has an appearance most compatible with a colloid cyst.
Because of its size and possible surrounding soft tissue
components, fine-needle aspiration should be considered.  If that
is not performed, ultrasound followup in 6 months would be
suggested.
2.  No solid thyroid nodules identified.

## 2011-03-25 ENCOUNTER — Telehealth: Payer: Self-pay | Admitting: *Deleted

## 2011-04-11 ENCOUNTER — Encounter: Payer: Self-pay | Admitting: Radiation Oncology

## 2011-04-15 ENCOUNTER — Ambulatory Visit
Admission: RE | Admit: 2011-04-15 | Discharge: 2011-04-15 | Disposition: A | Discharge status: Home / Self Care | Payer: Medicare Other | Source: Ambulatory Visit | Attending: Radiation Oncology | Admitting: Radiation Oncology

## 2011-04-15 VITALS — BP 175/72 | HR 56 | Temp 97.4°F | Resp 18 | Ht 62.0 in | Wt 117.8 lb

## 2011-04-15 DIAGNOSIS — C50419 Malignant neoplasm of upper-outer quadrant of unspecified female breast: Secondary | ICD-10-CM

## 2011-04-15 NOTE — Progress Notes (Signed)
SKIN WITH SOME DRY DESQUAMATION UNDER ARM BUT OTHERWISE OK.  SHE POINTS OUT A TINY BLACK STUBBLE ON BREAST AND SEEMS TO THINK IT MAY BE A SUTURE LEFT IN.

## 2011-04-15 NOTE — Progress Notes (Signed)
[facility] Radiation Oncology Follow up Note  Name: Deborah Lowe MRN: [id number]  Date: 04/15/2011  DOB: August 18, 1940  CC:   Ernestene Mention, MD Drue Second MD  DIAGNOSIS: Stage I (T1A N0 M0) invasive ductal carcinoma/DCIS of the left breast.  INTERVAL SINCE LAST RADIATION:4weeks    ALLERGIES: Avelox   NARRATIVE: The patient returns today approximately one month following completion of radiation therapy following conservative surgery in the management of her T1A N0 invasive ductal/DCIS of the left breast. She is without complaints today. She will see Dr. Welton Flakes for a followup visit on November 30 to discuss adjuvant hormone therapy. She does not have an appointment with Dr. Derrell Lolling.   PHYSICAL EXAM:  height is 5\' 2"  (1.575 m) and weight is 117 lb 12.8 oz (53.434 kg). Her oral temperature is 97.4 F (36.3 C). Her blood pressure is 175/72 and her pulse is 56. Her respiration is 18.  Nodes: Without palpable cervical supraclavicular or axillary lymphadenopathy. Chest: Lungs clear. Breasts slight thickening of the left breast with no masses. Right breast without masses or lesions. Abdomen without hepatomegaly. Extremities without edema.       IMPRESSION: Satisfactory progress. She will maintain her followup with Dr. Welton Flakes. Dr. Welton Flakes may schedule her for a baseline left breast mammogram and screening mammogram of the right breast this April which she typically undergoes mammography. I have not scheduled the patient for a formal followup visit.   PLAN: As noted above.

## 2011-04-25 ENCOUNTER — Other Ambulatory Visit: Payer: Self-pay | Admitting: Oncology

## 2011-04-25 ENCOUNTER — Telehealth: Payer: Self-pay | Admitting: *Deleted

## 2011-04-25 ENCOUNTER — Ambulatory Visit (HOSPITAL_BASED_OUTPATIENT_CLINIC_OR_DEPARTMENT_OTHER): Payer: Medicare Other | Admitting: Oncology

## 2011-04-25 ENCOUNTER — Other Ambulatory Visit (HOSPITAL_BASED_OUTPATIENT_CLINIC_OR_DEPARTMENT_OTHER): Payer: Medicare Other | Admitting: Lab

## 2011-04-25 ENCOUNTER — Encounter: Payer: Self-pay | Admitting: Oncology

## 2011-04-25 VITALS — BP 190/71 | HR 57 | Temp 98.2°F | Ht 62.0 in | Wt 117.7 lb

## 2011-04-25 DIAGNOSIS — Z170 Estrogen receptor positive status [ER+]: Secondary | ICD-10-CM

## 2011-04-25 DIAGNOSIS — C50419 Malignant neoplasm of upper-outer quadrant of unspecified female breast: Secondary | ICD-10-CM

## 2011-04-25 DIAGNOSIS — I10 Essential (primary) hypertension: Secondary | ICD-10-CM

## 2011-04-25 DIAGNOSIS — C50919 Malignant neoplasm of unspecified site of unspecified female breast: Secondary | ICD-10-CM

## 2011-04-25 DIAGNOSIS — H9319 Tinnitus, unspecified ear: Secondary | ICD-10-CM

## 2011-04-25 LAB — CBC WITH DIFFERENTIAL/PLATELET
BASO%: 0 % (ref 0.0–2.0)
Basophils Absolute: 0 10*3/uL (ref 0.0–0.1)
EOS%: 1 % (ref 0.0–7.0)
Eosinophils Absolute: 0.1 10*3/uL (ref 0.0–0.5)
HCT: 42.6 % (ref 34.8–46.6)
HGB: 14.5 g/dL (ref 11.6–15.9)
LYMPH%: 24.4 % (ref 14.0–49.7)
MCH: 29.8 pg (ref 25.1–34.0)
MCHC: 34 g/dL (ref 31.5–36.0)
MCV: 87.6 fL (ref 79.5–101.0)
MONO#: 0.5 10*3/uL (ref 0.1–0.9)
MONO%: 9 % (ref 0.0–14.0)
NEUT#: 3.4 10*3/uL (ref 1.5–6.5)
NEUT%: 65.6 % (ref 38.4–76.8)
Platelets: 231 10*3/uL (ref 145–400)
RBC: 4.86 10*6/uL (ref 3.70–5.45)
RDW: 12.8 % (ref 11.2–14.5)
WBC: 5.2 10*3/uL (ref 3.9–10.3)
lymph#: 1.3 10*3/uL (ref 0.9–3.3)

## 2011-04-25 LAB — COMPREHENSIVE METABOLIC PANEL
Alkaline Phosphatase: 78 U/L (ref 39–117)
BUN: 12 mg/dL (ref 6–23)
Glucose, Bld: 69 mg/dL — ABNORMAL LOW (ref 70–99)
Sodium: 139 mEq/L (ref 135–145)
Total Bilirubin: 0.6 mg/dL (ref 0.3–1.2)
Total Protein: 7.1 g/dL (ref 6.0–8.3)

## 2011-04-25 MED ORDER — ANASTROZOLE 1 MG PO TABS: 1.0000 mg | ORAL_TABLET | Freq: Every day | ORAL | Status: DC

## 2011-04-25 NOTE — Progress Notes (Signed)
OFFICE PROGRESS NOTE  CC Dr. Rudi Heap or Dr. Claud Kelp Dr. Chipper Herb  DIAGNOSIS: 70 year old female with stage I invasive ductal carcinoma of the left breast status post left lumpectomy on 12/25/2010.  PRIOR THERAPY:  #1 patient was found to have on a screening mammogram in July 2012 an abnormality in the left breast. She went on to have a core needle biopsy that showed an invasive ductal carcinoma with associated high-grade ductal carcinoma in situ. The tumor was estrogen receptor +80% progesterone receptor +10% proliferation marker 16%.  #2 patient underwent a left breast lumpectomy with a sentinel node procedure on 12/25/2010. The final pathology revealed a T1 C. N0) stage I invasive ductal carcinoma measuring less than 0.1 cm with associated high-grade ductal carcinoma in situ. ER positive PR positive with a proliferation marker of 16%.  #3 Patient is now status post radiation therapy to the left breast by Dr. Ballard Russell.  #4 patient will begin adjuvant Arimidex 1 mg daily starting tomorrow 04/26/2011. A total of 5 years of therapy is planned.  CURRENT THERAPY: Arimidex 1 mg daily starting 04/26/2011.   INTERVAL HISTORY: Deborah Lowe 70 y.o. female returns for followup visit today. Overall she tolerated the radiation well she is without any significant complaints. She does have headaches off-and-on for which she does take some ibuprofen or Advil. She is does have her blood pressure elevated she is a known hypertensive. She does take her antihypertensives regularly. She continues to monitor her blood pressures at home. She is having some ringing in her ears off-and-on which is chronic and ongoing. Otherwise there have been no changes in her health. The left breast tolerated radiation well there is some darkening of the skin otherwise no other findings.  MEDICAL HISTORY: Past Medical History  Diagnosis Date  . Hypertension   . Wears glasses   . Ringing in ears   .  Palpitations     ALLERGIES:  is allergic to avelox.  MEDICATIONS:  Current Outpatient Prescriptions  Medication Sig Dispense Refill  . losartan (COZAAR) 100 MG tablet Take 100 mg by mouth daily.        . metoprolol succinate (TOPROL-XL) 25 MG 24 hr tablet Take 25 mg by mouth daily.        . Multiple Vitamins-Minerals (MULTIVITAMIN WITH MINERALS) tablet Take 1 tablet by mouth daily.          SURGICAL HISTORY:  Past Surgical History  Procedure Date  . Cholecystectomy   . Tear duct probing     REVIEW OF SYSTEMS:  A comprehensive review of systems was negative.   PHYSICAL EXAMINATION: General appearance: alert, cooperative and appears stated age Neck: no adenopathy, no carotid bruit, no JVD, supple, symmetrical, trachea midline and thyroid not enlarged, symmetric, no tenderness/mass/nodules Lymph nodes: Cervical, supraclavicular, and axillary nodes normal. Resp: clear to auscultation bilaterally and normal percussion bilaterally Back: symmetric, no curvature. ROM normal. No CVA tenderness. Cardio: regular rate and rhythm, S1, S2 normal, no murmur, click, rub or gallop and normal apical impulse GI: soft, non-tender; bowel sounds normal; no masses,  no organomegaly Extremities: extremities normal, atraumatic, no cyanosis or edema Neurologic: Alert and oriented X 3, normal strength and tone. Normal symmetric reflexes. Normal coordination and gait Bilateral breast examination is performed. Left breast reveals well-healed surgical scar there is darkening of his skin secondary to radiation therapy there is no nipple retraction or discharge. There is some thickening of skin under the surgical scar. Right breast no masses or nipple  discharge or or retraction or inversion ECOG PERFORMANCE STATUS: 0 - Asymptomatic  Blood pressure 190/71, pulse 57, temperature 98.2 F (36.8 C), temperature source Oral, height 5\' 2"  (1.575 m), weight 117 lb 11.2 oz (53.388 kg).  LABORATORY DATA: Lab Results    Component Value Date   WBC 5.2 04/25/2011   HGB 14.5 04/25/2011   HCT 42.6 04/25/2011   MCV 87.6 04/25/2011   PLT 231 04/25/2011      Chemistry      Component Value Date/Time   NA 142 12/20/2010 1209   K 4.8 12/20/2010 1209   CL 104 12/20/2010 1209   CO2 29 12/20/2010 1209   BUN 10 12/20/2010 1209   CREATININE 0.58 12/20/2010 1209      Component Value Date/Time   CALCIUM 10.5 12/20/2010 1209   ALKPHOS 79 12/20/2010 1209   AST 14 12/20/2010 1209   ALT 15 12/20/2010 1209   BILITOT 1.0 12/20/2010 1209       RADIOGRAPHIC STUDIES:  No results found.  ASSESSMENT: 70 year old female with  #1 stage I invasive ductal carcinoma of the left breast status post left lumpectomy on 12/25/2010. The final pathology revealed a 0.1 cm invasive ductal carcinoma with associated high-grade ductal carcinoma in situ ER positive PR positive with a proliferation marker of 16%. Patient is now status post radiation therapy to the breast.  #2 hypertension   PLAN:   #1 patient will now begin adjuvant Arimidex 1 mg daily for 5 years. Risks and benefits of treatment are discussed very clearly with the patient and her husband. I discussed side effects with them. She was given literature on the drug. The prescription was a prescribed to her pharmacy.  #2 hypertension I have discussed monitoring her blood pressures. If her blood pressures remain high she is to call her primary care physician. She becomes symptomatic she is to report to the nearest emergency room.  #3 patient will be seen back in 3 months time for followup with me. However she knows to call me sooner if any concerns arise.   All questions were answered. The patient knows to call the clinic with any problems, questions or concerns. We can certainly see the patient much sooner if necessary.  I spent 25 minutes counseling the patient face to face. The total time spent in the appointment was 40 minutes.    Drue Second,  MD Medical/Oncology [facility] [phone] (beeper) [phone] (Office)  04/25/2011, 9:28 AM

## 2011-04-25 NOTE — Telephone Encounter (Signed)
GAVE PATIENT APPOINTMENT FOR 06-2011 

## 2011-07-17 ENCOUNTER — Other Ambulatory Visit: Payer: Self-pay | Admitting: Cardiology

## 2011-07-24 ENCOUNTER — Ambulatory Visit (HOSPITAL_BASED_OUTPATIENT_CLINIC_OR_DEPARTMENT_OTHER): Payer: Medicare Other | Admitting: Oncology

## 2011-07-24 ENCOUNTER — Encounter: Payer: Self-pay | Admitting: Oncology

## 2011-07-24 ENCOUNTER — Telehealth: Payer: Self-pay | Admitting: *Deleted

## 2011-07-24 ENCOUNTER — Ambulatory Visit: Payer: Medicare Other | Admitting: Lab

## 2011-07-24 VITALS — BP 184/83 | HR 64 | Temp 98.4°F | Ht 62.0 in | Wt 118.7 lb

## 2011-07-24 DIAGNOSIS — E559 Vitamin D deficiency, unspecified: Secondary | ICD-10-CM | POA: Diagnosis not present

## 2011-07-24 DIAGNOSIS — C50919 Malignant neoplasm of unspecified site of unspecified female breast: Secondary | ICD-10-CM | POA: Diagnosis not present

## 2011-07-24 DIAGNOSIS — C50419 Malignant neoplasm of upper-outer quadrant of unspecified female breast: Secondary | ICD-10-CM

## 2011-07-24 LAB — CBC WITH DIFFERENTIAL/PLATELET
Basophils Absolute: 0 10*3/uL (ref 0.0–0.1)
Eosinophils Absolute: 0.1 10*3/uL (ref 0.0–0.5)
HCT: 42.8 % (ref 34.8–46.6)
HGB: 14.5 g/dL (ref 11.6–15.9)
LYMPH%: 31 % (ref 14.0–49.7)
MCV: 85.6 fL (ref 79.5–101.0)
MONO%: 7.6 % (ref 0.0–14.0)
NEUT#: 2.7 10*3/uL (ref 1.5–6.5)
Platelets: 239 10*3/uL (ref 145–400)

## 2011-07-24 LAB — COMPREHENSIVE METABOLIC PANEL
Albumin: 4.2 g/dL (ref 3.5–5.2)
Alkaline Phosphatase: 76 U/L (ref 39–117)
BUN: 10 mg/dL (ref 6–23)
Glucose, Bld: 70 mg/dL (ref 70–99)
Total Bilirubin: 0.6 mg/dL (ref 0.3–1.2)

## 2011-07-24 NOTE — Progress Notes (Signed)
OFFICE PROGRESS NOTE  CC Dr. Rudi Heap or Dr. Claud Kelp Dr. Chipper Herb  DIAGNOSIS: 71 year old female with stage I invasive ductal carcinoma of the left breast status post left lumpectomy on 12/25/2010.  PRIOR THERAPY:  #1 patient was found to have on a screening mammogram in July 2012 an abnormality in the left breast. She went on to have a core needle biopsy that showed an invasive ductal carcinoma with associated high-grade ductal carcinoma in situ. The tumor was estrogen receptor +80% progesterone receptor +10% proliferation marker 16%.  #2 patient underwent a left breast lumpectomy with a sentinel node procedure on 12/25/2010. The final pathology revealed a T1 C. N0) stage I invasive ductal carcinoma measuring less than 0.1 cm with associated high-grade ductal carcinoma in situ. ER positive PR positive with a proliferation marker of 16%.  #3 Patient is now status post radiation therapy to the left breast by Dr. Ballard Russell.  #4 patient on adjuvant Arimidex 1 mg daily. A total of 5 years of therapy is planned.  CURRENT THERAPY: Arimidex 1 mg daily starting 04/26/2011.   INTERVAL HISTORY: Deborah Lowe 71 y.o. female returns for followup visit today. She is doing very well. She is without any complaints related to her medications. She has no nausea or vomiting, no hot flashes, no pain anywhere. Remainder of the 10 point review of systems is negative.  MEDICAL HISTORY: Past Medical History  Diagnosis Date  . Hypertension   . Wears glasses   . Ringing in ears   . Palpitations   . Breast cancer     ALLERGIES:  is allergic to avelox.  MEDICATIONS:  Current Outpatient Prescriptions  Medication Sig Dispense Refill  . anastrozole (ARIMIDEX) 1 MG tablet Take 1 mg by mouth daily.      Marland Kitchen losartan (COZAAR) 100 MG tablet Take 100 mg by mouth daily.        . metoprolol succinate (TOPROL-XL) 25 MG 24 hr tablet Take 25 mg by mouth daily.        . Multiple Vitamins-Minerals  (MULTIVITAMIN WITH MINERALS) tablet Take 1 tablet by mouth daily.          SURGICAL HISTORY:  Past Surgical History  Procedure Date  . Cholecystectomy   . Tear duct probing     REVIEW OF SYSTEMS:  A comprehensive review of systems was negative.   PHYSICAL EXAMINATION: General appearance: alert, cooperative and appears stated age Neck: no adenopathy, no carotid bruit, no JVD, supple, symmetrical, trachea midline and thyroid not enlarged, symmetric, no tenderness/mass/nodules Lymph nodes: Cervical, supraclavicular, and axillary nodes normal. Resp: clear to auscultation bilaterally and normal percussion bilaterally Back: symmetric, no curvature. ROM normal. No CVA tenderness. Cardio: regular rate and rhythm, S1, S2 normal, no murmur, click, rub or gallop and normal apical impulse GI: soft, non-tender; bowel sounds normal; no masses,  no organomegaly Extremities: extremities normal, atraumatic, no cyanosis or edema Neurologic: Alert and oriented X 3, normal strength and tone. Normal symmetric reflexes. Normal coordination and gait Bilateral breast examination is performed. Left breast reveals well-healed surgical scar there is darkening of his skin secondary to radiation therapy there is no nipple retraction or discharge. There is some thickening of skin under the surgical scar. Right breast no masses or nipple discharge or or retraction or inversion ECOG PERFORMANCE STATUS: 0 - Asymptomatic  Blood pressure 184/83, pulse 64, temperature 98.4 F (36.9 C), temperature source Oral, height 5\' 2"  (1.575 m), weight 118 lb 11.2 oz (53.842 kg).  LABORATORY  DATA: Lab Results  Component Value Date   WBC 4.6 07/24/2011   HGB 14.5 07/24/2011   HCT 42.8 07/24/2011   MCV 85.6 07/24/2011   PLT 239 07/24/2011      Chemistry      Component Value Date/Time   NA 141 07/24/2011 0904   K 4.2 07/24/2011 0904   CL 105 07/24/2011 0904   CO2 30 07/24/2011 0904   BUN 10 07/24/2011 0904   CREATININE 0.72  07/24/2011 0904      Component Value Date/Time   CALCIUM 10.0 07/24/2011 0904   ALKPHOS 76 07/24/2011 0904   AST 13 07/24/2011 0904   ALT 13 07/24/2011 0904   BILITOT 0.6 07/24/2011 0904       RADIOGRAPHIC STUDIES:  No results found.  ASSESSMENT: 71 year old female with  #1 stage I invasive ductal carcinoma of the left breast status post left lumpectomy on 12/25/2010. The final pathology revealed a 0.1 cm invasive ductal carcinoma with associated high-grade ductal carcinoma in situ ER positive PR positive with a proliferation marker of 16%. Patient is now status post radiation therapy to the breast.  #2. Now on Arimidex since 04/25/11 tolerating it well.  #3 hypertension   PLAN:   #1 patient  now on  adjuvant Arimidex 1 mg daily for 5 years. Risks and benefits of treatment are discussed very clearly with the patient and her husband. I discussed side effects with them. Overall she is tolerating it very well. She is without any significant side effects from it.  #2 hypertension I have discussed monitoring her blood pressures. If her blood pressures remain high she is to call her primary care physician. She becomes symptomatic she is to report to the nearest emergency room.  #3 patient will be seen back in 6 months time for followup with me. However she knows to call me sooner if any concerns arise.   All questions were answered. The patient knows to call the clinic with any problems, questions or concerns. We can certainly see the patient much sooner if necessary.  I spent 25 minutes counseling the patient face to face. The total time spent in the appointment was 40 minutes.    Drue Second, MD Medical/Oncology [facility] [phone] (beeper) [phone] (Office)  07/24/2011, 3:57 PM

## 2011-07-24 NOTE — Patient Instructions (Signed)
1. Continue taking the arimidex as prescribed  2. Mammogram and done density in September  3. Follow up with [place] in September with labs and the radiology

## 2011-07-24 NOTE — Telephone Encounter (Signed)
gave patient appointment for mammogram and bone density at [facility] on 01-21-2012 at 9:30 gave patient appointment to come in and see

## 2011-08-13 ENCOUNTER — Encounter: Payer: Self-pay | Admitting: Cardiovascular Disease

## 2011-08-13 ENCOUNTER — Ambulatory Visit (INDEPENDENT_AMBULATORY_CARE_PROVIDER_SITE_OTHER): Payer: Medicare Other | Admitting: Cardiovascular Disease

## 2011-08-13 VITALS — BP 165/86 | HR 65 | Ht 62.0 in | Wt 117.8 lb

## 2011-08-13 DIAGNOSIS — I10 Essential (primary) hypertension: Secondary | ICD-10-CM | POA: Insufficient documentation

## 2011-08-13 MED ORDER — METOPROLOL SUCCINATE ER 25 MG PO TB24: 25.0000 mg | ORAL_TABLET | Freq: Every day | ORAL | Status: DC

## 2011-08-13 MED ORDER — LOSARTAN POTASSIUM 100 MG PO TABS: 100.0000 mg | ORAL_TABLET | Freq: Every day | ORAL | Status: DC

## 2011-08-13 NOTE — Patient Instructions (Signed)
Your physician wants you to follow-up in: 6 months  You will receive a reminder letter in the mail two months in advance. If you don't receive a letter, please call our office to schedule the follow-up appointment.  Your physician recommends that you return for a FASTING lipid profile: 6 months   

## 2011-08-13 NOTE — Progress Notes (Signed)
    Deborah Lowe Date of Birth  14-Sep-1940 [facility] Office  [address]    [address]   [address] [place], [place]  [address]    [place], [place]  [address] [phone]  Fax  [phone]  [phone]  Fax [phone]  Problem List:  1. Hypertension 2. Palpitations 3. Breast Cancer - August, 2012,  lumpectomy of left breast, and XRT  History of Present Illness:  Deborah Lowe is a 71 year old female with the above-noted medical history. She is very well. She keeps a blood pressure log of her blood pressure readings have been fairly well controlled. She occasionally eats some extra salt in the form of fast food.  Current Outpatient Prescriptions on File Prior to Visit  Medication Sig Dispense Refill  . anastrozole (ARIMIDEX) 1 MG tablet Take 1 mg by mouth daily.      Marland Kitchen losartan (COZAAR) 100 MG tablet Take 100 mg by mouth daily.        . metoprolol succinate (TOPROL-XL) 25 MG 24 hr tablet Take 25 mg by mouth daily.        . Multiple Vitamins-Minerals (MULTIVITAMIN WITH MINERALS) tablet Take 1 tablet by mouth daily.          Allergies  Allergen Reactions  . Avelox (Moxifloxacin Hcl In Nacl) Anaphylaxis    Past Medical History  Diagnosis Date  . Hypertension   . Wears glasses   . Ringing in ears   . Palpitations   . Breast cancer     Past Surgical History  Procedure Date  . Cholecystectomy   . Tear duct probing     History  Smoking status  . Never Smoker   Smokeless tobacco  . Never Used    History  Alcohol Use No    Family History  Problem Relation Age of Onset  . Cancer Mother     colon    Reviw of Systems:  Reviewed in the HPI.  All other systems are negative.  Physical Exam: Blood pressure 165/86, pulse 65, height 5\' 2"  (1.575 m), weight 117 lb 12.8 oz (53.434 kg). General: Well developed, well nourished, in no acute distress.  Head: Normocephalic, atraumatic, sclera non-icteric, mucus membranes are moist,    Neck: Supple. Carotids are 2 + without bruits. No JVD  Lungs: Clear bilaterally to auscultation.  Heart: regular rate.  normal  S1 S2. No murmurs, gallops or rubs.  Abdomen: Soft, non-tender, non-distended with normal bowel sounds. No hepatomegaly. No rebound/guarding. No masses.  Msk:  Strength and tone are normal  Extremities: No clubbing or cyanosis. No edema.  Distal pedal pulses are 2+ and equal bilaterally.  Neuro: Alert and oriented X 3. Moves all extremities spontaneously.  Psych:  Responds to questions appropriately with a normal affect.  ECG:  Assessment / Plan:

## 2011-08-13 NOTE — Assessment & Plan Note (Signed)
Deborah Lowe is  doing well. We'll continue with her same medications. Her blood pressures little elevated today but her blood pressure is  typically normal as noted in her blood pressure log.  She still eats occasionally eats fast foods and probably eats will do more salt than she needs. I cautioned her about eating any extra salt.

## 2011-08-14 ENCOUNTER — Other Ambulatory Visit: Payer: Self-pay | Admitting: Cardiology

## 2011-09-03 ENCOUNTER — Encounter: Payer: Self-pay | Admitting: *Deleted

## 2011-09-10 DIAGNOSIS — R070 Pain in throat: Secondary | ICD-10-CM | POA: Diagnosis not present

## 2011-09-10 DIAGNOSIS — J069 Acute upper respiratory infection, unspecified: Secondary | ICD-10-CM | POA: Diagnosis not present

## 2011-10-16 DIAGNOSIS — D497 Neoplasm of unspecified behavior of endocrine glands and other parts of nervous system: Secondary | ICD-10-CM | POA: Diagnosis not present

## 2011-10-16 DIAGNOSIS — H6120 Impacted cerumen, unspecified ear: Secondary | ICD-10-CM | POA: Diagnosis not present

## 2011-10-16 DIAGNOSIS — H93299 Other abnormal auditory perceptions, unspecified ear: Secondary | ICD-10-CM | POA: Diagnosis not present

## 2011-10-17 ENCOUNTER — Telehealth: Payer: Self-pay | Admitting: *Deleted

## 2011-10-17 NOTE — Telephone Encounter (Signed)
Per MD notified pt to go to ED to be further evaluated. Pt advised " well, it's just a little area at the fold of my elbow at the top. It's really not that bad. It's doesn't hurt or feel bad. Just a little swollen. I must have hit it on something." Pt denied pain, fever, redness or heat to extremity. Pt requested to wait until next week to have arm evaluated further as " it's not that bad" Pt advised " I'll go to the ED if it starts to get real bad. It's not really that swollen right now and in the morning it seems to be better." Discussed again, Dr. Welton Flakes has recommended she have it evaluated further at Urgent care or ED. Pt states " I think I'll just wait and if it gets worse I'll go to the ED." Pt denied needing further assistance.

## 2011-10-17 NOTE — Telephone Encounter (Signed)
Needs to go to the ER for evaluation 

## 2011-10-17 NOTE — Telephone Encounter (Signed)
Pt called  Left arm ( surgical side) is swelling x 1week from elbow down with pain. Denies heat, redness,fever. Will review with MD

## 2011-10-22 ENCOUNTER — Other Ambulatory Visit: Payer: Self-pay | Admitting: Otolaryngology

## 2011-10-22 DIAGNOSIS — D497 Neoplasm of unspecified behavior of endocrine glands and other parts of nervous system: Secondary | ICD-10-CM

## 2011-10-24 ENCOUNTER — Ambulatory Visit
Admission: RE | Admit: 2011-10-24 | Discharge: 2011-10-24 | Disposition: A | Discharge status: Home / Self Care | Payer: Medicare Other | Source: Ambulatory Visit | Attending: Otolaryngology | Admitting: Otolaryngology

## 2011-10-24 DIAGNOSIS — D497 Neoplasm of unspecified behavior of endocrine glands and other parts of nervous system: Secondary | ICD-10-CM

## 2011-10-24 DIAGNOSIS — Z85850 Personal history of malignant neoplasm of thyroid: Secondary | ICD-10-CM | POA: Diagnosis not present

## 2011-10-24 DIAGNOSIS — E042 Nontoxic multinodular goiter: Secondary | ICD-10-CM | POA: Diagnosis not present

## 2011-12-11 ENCOUNTER — Other Ambulatory Visit: Payer: Self-pay | Admitting: Cardiovascular Disease

## 2011-12-11 MED ORDER — METOPROLOL SUCCINATE ER 25 MG PO TB24: 25.0000 mg | ORAL_TABLET | Freq: Every day | ORAL | Status: DC

## 2011-12-11 NOTE — Telephone Encounter (Signed)
Pt has appt on [phone]

## 2011-12-11 NOTE — Telephone Encounter (Signed)
Fax Received. Refill Completed. Deborah Lowe (R.M.A)   

## 2011-12-12 ENCOUNTER — Telehealth (INDEPENDENT_AMBULATORY_CARE_PROVIDER_SITE_OTHER): Payer: Self-pay

## 2011-12-12 NOTE — Telephone Encounter (Signed)
Pt had left breast lumpectomy in 2012.  She calls today c/o a small, elongated lump beneath her left breast, mid abdomen.  She first noticed it last week.  It is not sore, and has not changed in size from about 1 inch.  I told her I would notify Dr. Jacinto Halim nurse, and call her back if she needs to be seen.  She can be reached at home today.

## 2011-12-12 NOTE — Telephone Encounter (Signed)
I have made pt appt with Dr Derrell Lolling. This appt needs to be reviewed with Dr Derrell Lolling and moved to a sooner slot. Msg to Annabelle Harman to review and move appt forward. Pt aware office will call her back with sooner appt.

## 2011-12-17 ENCOUNTER — Telehealth: Payer: Self-pay | Admitting: Oncology

## 2011-12-17 NOTE — Telephone Encounter (Signed)
lmonvm advising the Deborah Lowe of her appt time change on 02/05/2012 being the lab time is 10:30am and the md appt is 11:00am on the same day

## 2012-01-01 ENCOUNTER — Telehealth (INDEPENDENT_AMBULATORY_CARE_PROVIDER_SITE_OTHER): Payer: Self-pay | Admitting: General Surgery

## 2012-01-01 NOTE — Telephone Encounter (Signed)
Patient called back and stated that she has some swelling under her breast but no pain. York Spaniel it was a little sore at first but not anymore. Not sure if it was due to bra worn causing pressure on site or not. Patient stated she already has appointment set for regular mammogram on 01/21/12 at [facility] and will see Dr. Welton Flakes (oncology) on 02/05/12. Patient stated she can wait until after both of those appointments to be seen by Dr. Derrell Lolling. I advised the patient that I will forward the information to Dr. Derrell Lolling and call her back with his response. Patient agreed a message can be left on her voicemail if she is unable to pick up the phone.

## 2012-01-01 NOTE — Telephone Encounter (Signed)
Called back patient and advised that Dr. Derrell Lolling approved waiting until testing and oncology visit has been completed before coming to see him. Patient agreed. She was advised that if pain or fever develop to please call.

## 2012-01-01 NOTE — Telephone Encounter (Signed)
Left message for patient to return the call. Need to know if patient had any additional testing performed since LOV w/[facility]. Appointment note for 01/06/12 stated "upper abdominal wall mass below breast". Need to if this was self referred for appointment, or was this based on another doctor's findings. If so, will need their notes and any test results for Dr. Derrell Lolling to review.

## 2012-01-06 ENCOUNTER — Encounter (INDEPENDENT_AMBULATORY_CARE_PROVIDER_SITE_OTHER): Payer: Medicare Other | Admitting: General Surgery

## 2012-01-21 ENCOUNTER — Other Ambulatory Visit: Payer: Self-pay | Admitting: Medical Oncology

## 2012-01-21 DIAGNOSIS — R928 Other abnormal and inconclusive findings on diagnostic imaging of breast: Secondary | ICD-10-CM | POA: Diagnosis not present

## 2012-01-21 DIAGNOSIS — Z0189 Encounter for other specified special examinations: Secondary | ICD-10-CM | POA: Diagnosis not present

## 2012-01-21 DIAGNOSIS — Z13820 Encounter for screening for osteoporosis: Secondary | ICD-10-CM | POA: Diagnosis not present

## 2012-01-21 DIAGNOSIS — N63 Unspecified lump in unspecified breast: Secondary | ICD-10-CM | POA: Diagnosis not present

## 2012-01-22 ENCOUNTER — Other Ambulatory Visit: Payer: Self-pay

## 2012-01-22 DIAGNOSIS — Z0189 Encounter for other specified special examinations: Secondary | ICD-10-CM | POA: Diagnosis not present

## 2012-01-22 DIAGNOSIS — R928 Other abnormal and inconclusive findings on diagnostic imaging of breast: Secondary | ICD-10-CM | POA: Diagnosis not present

## 2012-01-22 DIAGNOSIS — N63 Unspecified lump in unspecified breast: Secondary | ICD-10-CM | POA: Diagnosis not present

## 2012-01-22 DIAGNOSIS — N6019 Diffuse cystic mastopathy of unspecified breast: Secondary | ICD-10-CM | POA: Diagnosis not present

## 2012-02-05 ENCOUNTER — Encounter: Payer: Self-pay | Admitting: Oncology

## 2012-02-05 ENCOUNTER — Ambulatory Visit (HOSPITAL_BASED_OUTPATIENT_CLINIC_OR_DEPARTMENT_OTHER): Payer: Medicare Other | Admitting: Oncology

## 2012-02-05 ENCOUNTER — Other Ambulatory Visit (HOSPITAL_BASED_OUTPATIENT_CLINIC_OR_DEPARTMENT_OTHER): Payer: Medicare Other | Admitting: Lab

## 2012-02-05 ENCOUNTER — Telehealth: Payer: Self-pay | Admitting: *Deleted

## 2012-02-05 VITALS — BP 170/74 | HR 59 | Temp 98.6°F | Resp 20 | Ht 62.0 in | Wt 117.6 lb

## 2012-02-05 DIAGNOSIS — C50419 Malignant neoplasm of upper-outer quadrant of unspecified female breast: Secondary | ICD-10-CM | POA: Diagnosis not present

## 2012-02-05 DIAGNOSIS — Z170 Estrogen receptor positive status [ER+]: Secondary | ICD-10-CM

## 2012-02-05 DIAGNOSIS — E559 Vitamin D deficiency, unspecified: Secondary | ICD-10-CM

## 2012-02-05 LAB — COMPREHENSIVE METABOLIC PANEL (CC13)
AST: 14 U/L (ref 5–34)
Albumin: 3.9 g/dL (ref 3.5–5.0)
Alkaline Phosphatase: 89 U/L (ref 40–150)
Potassium: 4.4 mEq/L (ref 3.5–5.1)
Sodium: 140 mEq/L (ref 136–145)
Total Bilirubin: 1.1 mg/dL (ref 0.20–1.20)
Total Protein: 6.9 g/dL (ref 6.4–8.3)

## 2012-02-05 LAB — CBC WITH DIFFERENTIAL/PLATELET
BASO%: 0.4 % (ref 0.0–2.0)
EOS%: 0.6 % (ref 0.0–7.0)
HGB: 13.5 g/dL (ref 11.6–15.9)
MCH: 28.7 pg (ref 25.1–34.0)
MCHC: 33.1 g/dL (ref 31.5–36.0)
MONO%: 9.3 % (ref 0.0–14.0)
RBC: 4.71 10*6/uL (ref 3.70–5.45)
RDW: 12.8 % (ref 11.2–14.5)
lymph#: 1.6 10*3/uL (ref 0.9–3.3)

## 2012-02-05 MED ORDER — ANASTROZOLE 1 MG PO TABS: 1.0000 mg | ORAL_TABLET | Freq: Every day | ORAL | Status: DC

## 2012-02-05 NOTE — Patient Instructions (Addendum)
Continue taking arimidex   Keep your appointment Dr. Derrell Lolling  I will see you back in 6 months  Basic Metabolic Panel:  Lab 02/05/12 4132  NA 140  K 4.4  CL 106  CO2 26  GLUCOSE 89  BUN 10.0  CREATININE 0.8  CALCIUM 9.7  MG --  PHOS --   Liver Function Tests:  Lab 02/05/12 1045  AST 14  ALT 16  ALKPHOS 89  BILITOT 1.10  PROT 6.9  ALBUMIN 3.9  CBC:  Lab 02/05/12 1045  WBC 4.7  NEUTROABS 2.6  HGB 13.5  HCT 40.8  MCV 86.6  PLT 222

## 2012-02-05 NOTE — Telephone Encounter (Signed)
Gave patient appointment for 08-04-2012 starting at 10:00am

## 2012-02-05 NOTE — Progress Notes (Signed)
OFFICE PROGRESS NOTE  CC Dr. Rudi Heap or Dr. Claud Kelp Dr. Chipper Herb  DIAGNOSIS: 71 year old female with stage I invasive ductal carcinoma of the left breast status post left lumpectomy on 12/25/2010.  PRIOR THERAPY:  #1 patient was found to have on a screening mammogram in July 2012 an abnormality in the left breast. She went on to have a core needle biopsy that showed an invasive ductal carcinoma with associated high-grade ductal carcinoma in situ. The tumor was estrogen receptor +80% progesterone receptor +10% proliferation marker 16%.  #2 patient underwent a left breast lumpectomy with a sentinel node procedure on 12/25/2010. The final pathology revealed a T1 C. N0) stage I invasive ductal carcinoma measuring less than 0.1 cm with associated high-grade ductal carcinoma in situ. ER positive PR positive with a proliferation marker of 16%.  #3 Patient is now status post radiation therapy to the left breast by Dr. Ballard Russell.  #4 patient on adjuvant Arimidex 1 mg daily. A total of 5 years of therapy is planned.  CURRENT THERAPY: Arimidex 1 mg daily starting 04/26/2011.   INTERVAL HISTORY: Deborah Lowe 71 y.o. female returns for followup visit today. She is doing very well. She is without any complaints related to her medications. She has no nausea or vomiting, no hot flashes, no pain anywhere. Remainder of the 10 point review of systems is negative.  MEDICAL HISTORY: Past Medical History  Diagnosis Date  . Hypertension   . Wears glasses   . Ringing in ears   . Palpitations   . Breast cancer     ALLERGIES:  is allergic to avelox.  MEDICATIONS:  Current Outpatient Prescriptions  Medication Sig Dispense Refill  . anastrozole (ARIMIDEX) 1 MG tablet Take 1 mg by mouth daily.      Marland Kitchen losartan (COZAAR) 100 MG tablet Take 1 tablet (100 mg total) by mouth daily.  30 tablet  6  . metoprolol succinate (TOPROL-XL) 25 MG 24 hr tablet Take 1 tablet (25 mg total) by mouth  daily.  90 tablet  3  . Multiple Vitamins-Minerals (MULTIVITAMIN WITH MINERALS) tablet Take 1 tablet by mouth daily.          SURGICAL HISTORY:  Past Surgical History  Procedure Date  . Cholecystectomy   . Tear duct probing   . Cardiac catheterization 01/29/2007  . Cardiovascular stress test 07/19/2002    EF 84%    REVIEW OF SYSTEMS:  A comprehensive review of systems was negative.   PHYSICAL EXAMINATION:  Well-developed nourished female in no acute distress HEENT exam EOMI PERRLA sclerae anicteric no conjunctival pallor oral mucosa is moist neck is supple lungs are clear bilaterally to auscultation and percussion cardiovascular is regular rate rhythm abdomen is soft nontender nondistended bowel sounds are present no obvious splenomegaly extremities no edema neuro patient's alert oriented otherwise nonfocal Bilateral breast examination is performed. Left breast reveals well-healed surgical scar there is darkening of his skin secondary to radiation therapy there is no nipple retraction or discharge. There is some thickening of skin under the surgical scar. Right breast no masses or nipple discharge or or retraction or inversion ECOG PERFORMANCE STATUS: 0 - Asymptomatic  Blood pressure 170/74, pulse 59, temperature 98.6 F (37 C), temperature source Oral, resp. rate 20, height 5\' 2"  (1.575 m), weight 117 lb 9.6 oz (53.343 kg).  LABORATORY DATA: Lab Results  Component Value Date   WBC 4.7 02/05/2012   HGB 13.5 02/05/2012   HCT 40.8 02/05/2012   MCV 86.6  02/05/2012   PLT 222 02/05/2012      Chemistry      Component Value Date/Time   NA 141 07/24/2011 0904   K 4.2 07/24/2011 0904   CL 105 07/24/2011 0904   CO2 30 07/24/2011 0904   BUN 10 07/24/2011 0904   CREATININE 0.72 07/24/2011 0904      Component Value Date/Time   CALCIUM 10.0 07/24/2011 0904   ALKPHOS 76 07/24/2011 0904   AST 13 07/24/2011 0904   ALT 13 07/24/2011 0904   BILITOT 0.6 07/24/2011 0904       RADIOGRAPHIC  STUDIES:  No results found.  ASSESSMENT: 71 year old female with  #1 stage I invasive ductal carcinoma of the left breast status post left lumpectomy on 12/25/2010. The final pathology revealed a 0.1 cm invasive ductal carcinoma with associated high-grade ductal carcinoma in situ ER positive PR positive with a proliferation marker of 16%. Patient is now status post radiation therapy to the breast.currently patient has no evidence of recurrent disease  #2. Now on Arimidex since 04/25/11 tolerating it well.  #3 hypertension   PLAN:  #1 patient will continue taking Arimidex 1 mg daily I think overall she is tolerating it quite well.  #2 patient will be seen back in 6 months time for followup with me. However she knows to call me sooner if any concerns arise.   All questions were answered. The patient knows to call the clinic with any problems, questions or concerns. We can certainly see the patient much sooner if necessary.  I spent 25 minutes counseling the patient face to face. The total time spent in the appointment was 40 minutes.    Drue Second, MD Medical/Oncology [facility] [phone] (beeper) [phone] (Office)  02/05/2012, 11:38 AM

## 2012-02-05 NOTE — Progress Notes (Signed)
OFFICE PROGRESS NOTE  CC Dr. Rudi Heap or Dr. Claud Kelp Dr. Chipper Herb  DIAGNOSIS: 71 year old female with stage I invasive ductal carcinoma of the left breast status post left lumpectomy on 12/25/2010.  PRIOR THERAPY:  #1 patient was found to have on a screening mammogram in July 2012 an abnormality in the left breast. She went on to have a core needle biopsy that showed an invasive ductal carcinoma with associated high-grade ductal carcinoma in situ. The tumor was estrogen receptor +80% progesterone receptor +10% proliferation marker 16%.  #2 patient underwent a left breast lumpectomy with a sentinel node procedure on 12/25/2010. The final pathology revealed a T1 C. N0) stage I invasive ductal carcinoma measuring less than 0.1 cm with associated high-grade ductal carcinoma in situ. ER positive PR positive with a proliferation marker of 16%.  #3 Patient is now status post radiation therapy to the left breast by Dr. Ballard Russell.  #4 patient on adjuvant Arimidex 1 mg daily. A total of 5 years of therapy is planned.  CURRENT THERAPY: Arimidex 1 mg daily starting 04/26/2011.   INTERVAL HISTORY: Deborah Lowe 71 y.o. female returns for followup visit today. She is doing very well. She is without any complaints related to her medications. She has no nausea or vomiting, no hot flashes, no pain anywhere. Remainder of the 10 point review of systems is negative.  MEDICAL HISTORY: Past Medical History  Diagnosis Date  . Hypertension   . Wears glasses   . Ringing in ears   . Palpitations   . Breast cancer     ALLERGIES:  is allergic to avelox.  MEDICATIONS:  Current Outpatient Prescriptions  Medication Sig Dispense Refill  . anastrozole (ARIMIDEX) 1 MG tablet Take 1 tablet (1 mg total) by mouth daily.  90 tablet  12  . losartan (COZAAR) 100 MG tablet Take 1 tablet (100 mg total) by mouth daily.  30 tablet  6  . metoprolol succinate (TOPROL-XL) 25 MG 24 hr tablet Take 1 tablet  (25 mg total) by mouth daily.  90 tablet  3  . Multiple Vitamins-Minerals (MULTIVITAMIN WITH MINERALS) tablet Take 1 tablet by mouth daily.          SURGICAL HISTORY:  Past Surgical History  Procedure Date  . Cholecystectomy   . Tear duct probing   . Cardiac catheterization 01/29/2007  . Cardiovascular stress test 07/19/2002    EF 84%    REVIEW OF SYSTEMS:  A comprehensive review of systems was negative.   PHYSICAL EXAMINATION: General appearance: alert, cooperative and appears stated age Neck: no adenopathy, no carotid bruit, no JVD, supple, symmetrical, trachea midline and thyroid not enlarged, symmetric, no tenderness/mass/nodules Lymph nodes: Cervical, supraclavicular, and axillary nodes normal. Resp: clear to auscultation bilaterally and normal percussion bilaterally Back: symmetric, no curvature. ROM normal. No CVA tenderness. Cardio: regular rate and rhythm, S1, S2 normal, no murmur, click, rub or gallop and normal apical impulse GI: soft, non-tender; bowel sounds normal; no masses,  no organomegaly Extremities: extremities normal, atraumatic, no cyanosis or edema Neurologic: Alert and oriented X 3, normal strength and tone. Normal symmetric reflexes. Normal coordination and gait Bilateral breast examination is performed. Left breast reveals well-healed surgical scar there is darkening of his skin secondary to radiation therapy there is no nipple retraction or discharge. There is some thickening of skin under the surgical scar. Right breast no masses or nipple discharge or or retraction or inversion ECOG PERFORMANCE STATUS: 0 - Asymptomatic  Blood pressure 170/74,  pulse 59, temperature 98.6 F (37 C), temperature source Oral, resp. rate 20, height 5\' 2"  (1.575 m), weight 117 lb 9.6 oz (53.343 kg).  LABORATORY DATA: Lab Results  Component Value Date   WBC 4.7 02/05/2012   HGB 13.5 02/05/2012   HCT 40.8 02/05/2012   MCV 86.6 02/05/2012   PLT 222 02/05/2012      Chemistry        Component Value Date/Time   NA 140 02/05/2012 1045   NA 141 07/24/2011 0904   K 4.4 02/05/2012 1045   K 4.2 07/24/2011 0904   CL 106 02/05/2012 1045   CL 105 07/24/2011 0904   CO2 26 02/05/2012 1045   CO2 30 07/24/2011 0904   BUN 10.0 02/05/2012 1045   BUN 10 07/24/2011 0904   CREATININE 0.8 02/05/2012 1045   CREATININE 0.72 07/24/2011 0904      Component Value Date/Time   CALCIUM 9.7 02/05/2012 1045   CALCIUM 10.0 07/24/2011 0904   ALKPHOS 89 02/05/2012 1045   ALKPHOS 76 07/24/2011 0904   AST 14 02/05/2012 1045   AST 13 07/24/2011 0904   ALT 16 02/05/2012 1045   ALT 13 07/24/2011 0904   BILITOT 1.10 02/05/2012 1045   BILITOT 0.6 07/24/2011 0904       RADIOGRAPHIC STUDIES:  No results found.  ASSESSMENT: 71 year old female with  #1 stage I invasive ductal carcinoma of the left breast status post left lumpectomy on 12/25/2010. The final pathology revealed a 0.1 cm invasive ductal carcinoma with associated high-grade ductal carcinoma in situ ER positive PR positive with a proliferation marker of 16%. Patient is now status post radiation therapy to the breast.  #2. Now on Arimidex since 04/25/11 tolerating it well.  #3 hypertension   PLAN:   #1 patient  now on  adjuvant Arimidex 1 mg daily for 5 years. Risks and benefits of treatment are discussed very clearly with the patient and her husband. I discussed side effects with them. Overall she is tolerating it very well. She is without any significant side effects from it.  #2 hypertension I have discussed monitoring her blood pressures. If her blood pressures remain high she is to call her primary care physician. She becomes symptomatic she is to report to the nearest emergency room.  #3 patient will be seen back in 6 months time for followup with me. However she knows to call me sooner if any concerns arise.   All questions were answered. The patient knows to call the clinic with any problems, questions or concerns. We can certainly see  the patient much sooner if necessary.  I spent 25 minutes counseling the patient face to face. The total time spent in the appointment was 40 minutes.    Drue Second, MD Medical/Oncology [facility] [phone] (beeper) [phone] (Office)  02/05/2012, 11:57 AM

## 2012-02-09 ENCOUNTER — Ambulatory Visit: Payer: Medicare Other | Admitting: Cardiovascular Disease

## 2012-02-09 DIAGNOSIS — I10 Essential (primary) hypertension: Secondary | ICD-10-CM | POA: Diagnosis not present

## 2012-02-09 DIAGNOSIS — J019 Acute sinusitis, unspecified: Secondary | ICD-10-CM | POA: Diagnosis not present

## 2012-02-10 ENCOUNTER — Telehealth (INDEPENDENT_AMBULATORY_CARE_PROVIDER_SITE_OTHER): Payer: Self-pay | Admitting: General Surgery

## 2012-02-10 NOTE — Telephone Encounter (Signed)
Tried to call back patient per message left earlier this morning to advise her to keep her appointment with Dr. Derrell Lolling. Appointment does not change unless we hear differently from oncology. Will have to try again later, no voicemail pick up to leave message.

## 2012-02-10 NOTE — Telephone Encounter (Signed)
Spoke with patient and advised that unless we hear otherwise from oncology her appointment with Dr. Derrell Lolling needs to be kept on 02/12/12 at 11:45. Patient stated she was told everything was fine and seems to be under the impression that she does not have to see Dr. Derrell Lolling at this point. Advised I would check into this and get back to her. Otherwise the appointment needs to be kept.

## 2012-02-12 ENCOUNTER — Ambulatory Visit (INDEPENDENT_AMBULATORY_CARE_PROVIDER_SITE_OTHER): Payer: Medicare Other | Admitting: General Surgery

## 2012-02-12 ENCOUNTER — Encounter (INDEPENDENT_AMBULATORY_CARE_PROVIDER_SITE_OTHER): Payer: Self-pay | Admitting: General Surgery

## 2012-02-12 ENCOUNTER — Encounter: Payer: Self-pay | Admitting: Cardiology

## 2012-02-12 VITALS — BP 124/78 | HR 62 | Temp 97.7°F | Resp 16 | Ht 62.0 in | Wt 119.6 lb

## 2012-02-12 DIAGNOSIS — C50419 Malignant neoplasm of upper-outer quadrant of unspecified female breast: Secondary | ICD-10-CM | POA: Diagnosis not present

## 2012-02-12 NOTE — Patient Instructions (Signed)
Your breast exam today is normal. There is no evidence of cancer by physical exam.  The recent biopsy of your right breast is completely benign, showing fibrocystic changes only.  Continue to take the arimidex medication and keep your routine followup appointments with Dr. Welton Flakes.  Return to see Dr. Derrell Lolling in one year after you get your annual mammograms.

## 2012-02-12 NOTE — Progress Notes (Signed)
Patient ID: Deborah Lowe, female   DOB: [birth year], 71 y.o.   MRN: [id number]  Chief Complaint  Patient presents with  . Other    eval upper abd wall mass    HPI Deborah Lowe is a 71 y.o. female.  She returns for long-term followup regarding her breast cancer.  On 12/25/2010 she underwent left partial mastectomy and sentinel node biopsy. She had ductal carcinoma in situ with microinvasion, sentinel nodes negative, ER-positive, HER-2-negative. Stage I breast cancer. She underwent adjuvant radiation therapy and is on adjuvant arimidex and she is being followed by Dr. Drue Second.  Recent mammograms were done one year following her initial surgery. There was a question of a density on the right side and a questionable density left side. Ultrasound of both breasts reveal no abnormality of the left on the right side they did an  image guided biopsy and found fibrocystic disease completely benign. They felt this was concordant.  The patient feels fine. She has noticed no change in her breast. She is in good spirits. No new medical problems. HPI  Past Medical History  Diagnosis Date  . Hypertension   . Wears glasses   . Ringing in ears   . Palpitations   . Breast cancer     Past Surgical History  Procedure Date  . Cholecystectomy   . Tear duct probing   . Cardiac catheterization 01/29/2007  . Cardiovascular stress test 07/19/2002    EF 84%    Family History  Problem Relation Age of Onset  . Cancer Mother     colon    Social History History  Substance Use Topics  . Smoking status: Never Smoker   . Smokeless tobacco: Never Used  . Alcohol Use: No    Allergies  Allergen Reactions  . Avelox (Moxifloxacin Hcl In Nacl) Anaphylaxis    Current Outpatient Prescriptions  Medication Sig Dispense Refill  . amoxicillin (AMOXIL) 500 MG capsule       . anastrozole (ARIMIDEX) 1 MG tablet Take 1 tablet (1 mg total) by mouth daily.  90 tablet  12  . losartan (COZAAR) 100 MG tablet  Take 1 tablet (100 mg total) by mouth daily.  30 tablet  6  . metoprolol succinate (TOPROL-XL) 25 MG 24 hr tablet Take 1 tablet (25 mg total) by mouth daily.  90 tablet  3  . Multiple Vitamins-Minerals (MULTIVITAMIN WITH MINERALS) tablet Take 1 tablet by mouth daily.          Review of Systems Review of Systems  Constitutional: Negative for fever, chills and unexpected weight change.  HENT: Negative for hearing loss, congestion, sore throat, trouble swallowing and voice change.   Eyes: Negative for visual disturbance.  Respiratory: Negative for cough and wheezing.   Cardiovascular: Negative for chest pain, palpitations and leg swelling.  Gastrointestinal: Negative for nausea, vomiting, abdominal pain, diarrhea, constipation, blood in stool, abdominal distention and anal bleeding.  Genitourinary: Negative for hematuria, vaginal bleeding and difficulty urinating.  Musculoskeletal: Negative for arthralgias.  Skin: Negative for rash and wound.  Neurological: Negative for seizures, syncope and headaches.  Hematological: Negative for adenopathy. Does not bruise/bleed easily.  Psychiatric/Behavioral: Negative for confusion.    Blood pressure 124/78, pulse 62, temperature 97.7 F (36.5 C), temperature source Temporal, resp. rate 16, height 5\' 2"  (1.575 m), weight 119 lb 9.6 oz (54.25 kg).  Physical Exam Physical Exam  Constitutional: She is oriented to person, place, and time. She appears well-developed and well-nourished. No distress.  Eyes: Conjunctivae normal and EOM are normal. Pupils are equal, round, and reactive to light. Left eye exhibits no discharge. No scleral icterus.  Neck: Neck supple. No JVD present. No tracheal deviation present. No thyromegaly present.  Cardiovascular: Normal rate, regular rhythm, normal heart sounds and intact distal pulses.   No murmur heard. Pulmonary/Chest: Effort normal and breath sounds normal. No respiratory distress. She has no wheezes. She has no  rales. She exhibits no tenderness.         Well-healed scars left breast, upper outer quadrant and left axilla. No palpable mass, skin change or adenopathy. Right breast reveals bruising but no hematoma inferiorly. No mass. No skin change. No axillary adenopathy.  Abdominal: Soft.  Musculoskeletal: She exhibits no edema and no tenderness.  Lymphadenopathy:    She has no cervical adenopathy.  Neurological: She is alert and oriented to person, place, and time. She exhibits normal muscle tone. Coordination normal.  Skin: Skin is warm. No rash noted. She is not diaphoretic. No erythema. No pallor.  Psychiatric: She has a normal mood and affect. Her behavior is normal. Judgment and thought content normal.    Data Reviewed Recent imaging studies and biopsy. Cancer Center notes.  Assessment    DCIS with microinvasion left breast, upper outer quadrant, receptor positive, HER-2-negative. Pathologic stage TI, N0, stage I.  No evidence of recurrence one year following left partial mastectomy sentinel node biopsy and radiation therapy and ongoing adjuvant   arimidex therapy.  Fibrocystic disease right breast, recovering uneventfully following image guided biopsy    Plan    Continue arimidex and routine followup with Dr. Drue Second  Repeat mammograms in 1 year and see me at that time.       Deborah Lowe. Derrell Lolling, M.D., [facility] General and Minimally invasive Surgery Breast and Colorectal Surgery Office:   [phone] Pager:   [phone]  02/12/2012, 1:07 PM

## 2012-02-24 DIAGNOSIS — R0982 Postnasal drip: Secondary | ICD-10-CM | POA: Diagnosis not present

## 2012-02-24 DIAGNOSIS — R51 Headache: Secondary | ICD-10-CM | POA: Diagnosis not present

## 2012-03-08 DIAGNOSIS — L570 Actinic keratosis: Secondary | ICD-10-CM | POA: Diagnosis not present

## 2012-03-08 DIAGNOSIS — L821 Other seborrheic keratosis: Secondary | ICD-10-CM | POA: Diagnosis not present

## 2012-03-08 DIAGNOSIS — D235 Other benign neoplasm of skin of trunk: Secondary | ICD-10-CM | POA: Diagnosis not present

## 2012-03-15 ENCOUNTER — Other Ambulatory Visit: Payer: Self-pay | Admitting: Cardiovascular Disease

## 2012-03-15 NOTE — Telephone Encounter (Signed)
Fax Received. Refill Completed. Deano Tomaszewski Chowoe (R.M.A)   

## 2012-04-15 DIAGNOSIS — Z09 Encounter for follow-up examination after completed treatment for conditions other than malignant neoplasm: Secondary | ICD-10-CM | POA: Diagnosis not present

## 2012-05-13 DIAGNOSIS — Z9189 Other specified personal risk factors, not elsewhere classified: Secondary | ICD-10-CM | POA: Diagnosis not present

## 2012-05-13 DIAGNOSIS — Z124 Encounter for screening for malignant neoplasm of cervix: Secondary | ICD-10-CM | POA: Diagnosis not present

## 2012-05-14 DIAGNOSIS — I10 Essential (primary) hypertension: Secondary | ICD-10-CM | POA: Diagnosis not present

## 2012-05-15 ENCOUNTER — Other Ambulatory Visit: Payer: Self-pay | Admitting: Oncology

## 2012-05-18 ENCOUNTER — Telehealth: Payer: Self-pay | Admitting: Cardiovascular Disease

## 2012-05-18 NOTE — Telephone Encounter (Signed)
New Problem:    Patient called in because she is having fluctuating BPs.  Please call back.

## 2012-05-18 NOTE — Telephone Encounter (Signed)
Patient saw her PCP last week and they changed her Losartan to Losartan HCT 100/12.5 mg daily. Last night she had palpitations and took an extra Toprol XL 25 mg and they subsided.  She wanted to schedule appointment.  Did schedule appointment to see Lawson Fiscal.  Patient stated if she had anything more before her appointment would go back to her PCP. Advised to call back if needs anything before then. Will forward to Dr Elease Hashimoto and Michael Litter RN

## 2012-05-18 NOTE — Telephone Encounter (Signed)
noted 

## 2012-05-31 ENCOUNTER — Ambulatory Visit (INDEPENDENT_AMBULATORY_CARE_PROVIDER_SITE_OTHER): Payer: Medicare Other | Admitting: Nurse Practitioner

## 2012-05-31 ENCOUNTER — Encounter: Payer: Self-pay | Admitting: Nurse Practitioner

## 2012-05-31 VITALS — BP 135/80 | HR 60 | Ht 61.5 in | Wt 117.1 lb

## 2012-05-31 DIAGNOSIS — I10 Essential (primary) hypertension: Secondary | ICD-10-CM | POA: Diagnosis not present

## 2012-05-31 NOTE — Progress Notes (Signed)
Deborah Lowe Date of Birth: May 05, 1941 Medical Record #[id number]  History of Present Illness: Ahmari is seen back today for a work in visit. She is seen for Dr. Elease Hashimoto. She is a former patient of Dr. Ronnald Nian. She has HTN, palpitations controlled on beta blocker therapy and a history of normal coronary and renal arteries in 2008. She was last seen here in March and was doing well.  She is seen today. She is here alone. She was doing pretty good up until Christmas. BP spiked up to over 200. Her medicines were changed and she is now on Losartan HCT. Her readings from home look pretty good. She is doing better. Rare palpitations. No chest pain. No recent labs. Overall she is feeling pretty good. She has had a past history of too much salt.   Current Outpatient Prescriptions on File Prior to Visit  Medication Sig Dispense Refill  . anastrozole (ARIMIDEX) 1 MG tablet TAKE 1 TABLET (1 MG TOTAL) BY MOUTH DAILY.  30 tablet  11  . losartan-hydrochlorothiazide (HYZAAR) 100-12.5 MG per tablet Take 1 tablet by mouth daily.      . metoprolol succinate (TOPROL-XL) 25 MG 24 hr tablet Take 1 tablet (25 mg total) by mouth daily.  90 tablet  3  . Multiple Vitamins-Minerals (MULTIVITAMIN WITH MINERALS) tablet Take 1 tablet by mouth daily.          Allergies  Allergen Reactions  . Avelox (Moxifloxacin Hcl In Nacl) Anaphylaxis    Past Medical History  Diagnosis Date  . Hypertension   . Wears glasses   . Ringing in ears   . Palpitations     on beta blocker therapy  . Breast cancer     s/p lumpectomy of left breast with XRT    Past Surgical History  Procedure Date  . Cholecystectomy   . Tear duct probing   . Cardiac catheterization 01/29/2007  . Cardiovascular stress test 07/19/2002    EF 84%    History  Smoking status  . Never Smoker   Smokeless tobacco  . Never Used    History  Alcohol Use No    Family History  Problem Relation Age of Onset  . Cancer Mother     colon     Review of Systems: The review of systems is per the HPI.  All other systems were reviewed and are negative.  Physical Exam: BP 160/78  Pulse 60  Ht 5' 1.5" (1.562 m)  Wt 117 lb 1.9 oz (53.125 kg)  BMI 21.77 kg/m2 Repeat BP by me is 135/85.  Patient is very pleasant and in no acute distress. Skin is warm and dry. Color is normal.  HEENT is unremarkable. Normocephalic/atraumatic. PERRL. Sclera are nonicteric. Neck is supple. No masses. No JVD. Lungs are clear. Cardiac exam shows a regular rate and rhythm. Abdomen is soft. Extremities are without edema. Gait and ROM are intact. No gross neurologic deficits noted.  LABORATORY DATA: N/A  Lab Results  Component Value Date   WBC 4.7 02/05/2012   HGB 13.5 02/05/2012   HCT 40.8 02/05/2012   PLT 222 02/05/2012   GLUCOSE 89 02/05/2012   ALT 16 02/05/2012   AST 14 02/05/2012   NA 140 02/05/2012   K 4.4 02/05/2012   CL 106 02/05/2012   CREATININE 0.8 02/05/2012   BUN 10.0 02/05/2012   CO2 26 02/05/2012    Assessment / Plan:  1. HTN - repeat BP by me looks better. I have left her on  her current regimen. We will check fasting labs later this week. Will change her visit with Dr. Elease Hashimoto for 4 months from now.   2. Palpitations - stable with beta blocker therapy.   Patient is agreeable to this plan and will call if any problems develop in the interim.

## 2012-05-31 NOTE — Patient Instructions (Signed)
Stay on your current medicines  Lets check some fasting labs later this week  We will reschedule your visit with Dr. Elease Hashimoto for 4 months  Call the [facility] office at [phone] if you have any questions, problems or concerns.

## 2012-06-01 ENCOUNTER — Encounter (INDEPENDENT_AMBULATORY_CARE_PROVIDER_SITE_OTHER): Payer: Self-pay

## 2012-06-03 ENCOUNTER — Other Ambulatory Visit (INDEPENDENT_AMBULATORY_CARE_PROVIDER_SITE_OTHER): Payer: Medicare Other

## 2012-06-03 DIAGNOSIS — I10 Essential (primary) hypertension: Secondary | ICD-10-CM

## 2012-06-03 LAB — LIPID PANEL
Cholesterol: 194 mg/dL (ref 0–200)
HDL: 54.7 mg/dL (ref >39.00)
LDL Cholesterol: 123 mg/dL — ABNORMAL HIGH (ref 0–99)
Total CHOL/HDL Ratio: 4
Triglycerides: 80 mg/dL (ref 0.0–149.0)
VLDL: 16 mg/dL (ref 0.0–40.0)

## 2012-06-03 LAB — HEPATIC FUNCTION PANEL
ALT: 17 U/L (ref 0–35)
AST: 11 U/L (ref 0–37)
Albumin: 4 g/dL (ref 3.5–5.2)
Alkaline Phosphatase: 70 U/L (ref 39–117)
Bilirubin, Direct: 0 mg/dL (ref 0.0–0.3)
Total Bilirubin: 1.1 mg/dL (ref 0.3–1.2)
Total Protein: 7.1 g/dL (ref 6.0–8.3)

## 2012-06-03 LAB — BASIC METABOLIC PANEL
BUN: 12 mg/dL (ref 6–23)
CO2: 30 mEq/L (ref 19–32)
Calcium: 9.4 mg/dL (ref 8.4–10.5)
Chloride: 95 mEq/L — ABNORMAL LOW (ref 96–112)
Creatinine, Ser: 0.6 mg/dL (ref 0.4–1.2)
GFR: 103.3 mL/min (ref >60.00)
Glucose, Bld: 91 mg/dL (ref 70–99)
Potassium: 3.4 mEq/L — ABNORMAL LOW (ref 3.5–5.1)
Sodium: 133 mEq/L — ABNORMAL LOW (ref 135–145)

## 2012-06-04 ENCOUNTER — Other Ambulatory Visit: Payer: Self-pay | Admitting: *Deleted

## 2012-06-04 DIAGNOSIS — R002 Palpitations: Secondary | ICD-10-CM

## 2012-06-04 DIAGNOSIS — E876 Hypokalemia: Secondary | ICD-10-CM

## 2012-06-14 ENCOUNTER — Ambulatory Visit: Payer: Medicare Other | Admitting: Cardiovascular Disease

## 2012-06-18 ENCOUNTER — Other Ambulatory Visit (INDEPENDENT_AMBULATORY_CARE_PROVIDER_SITE_OTHER): Payer: Medicare Other

## 2012-06-18 DIAGNOSIS — R002 Palpitations: Secondary | ICD-10-CM

## 2012-06-18 DIAGNOSIS — E876 Hypokalemia: Secondary | ICD-10-CM

## 2012-06-18 LAB — BASIC METABOLIC PANEL
BUN: 12 mg/dL (ref 6–23)
CO2: 31 mEq/L (ref 19–32)
Calcium: 9.6 mg/dL (ref 8.4–10.5)
Chloride: 97 mEq/L (ref 96–112)
Creatinine, Ser: 0.6 mg/dL (ref 0.4–1.2)
GFR: 116.4 mL/min (ref >60.00)
Glucose, Bld: 96 mg/dL (ref 70–99)
Potassium: 3.7 mEq/L (ref 3.5–5.1)
Sodium: 134 mEq/L — ABNORMAL LOW (ref 135–145)

## 2012-06-18 LAB — TSH: TSH: 2.93 u[IU]/mL (ref 0.35–5.50)

## 2012-06-28 DIAGNOSIS — J019 Acute sinusitis, unspecified: Secondary | ICD-10-CM | POA: Diagnosis not present

## 2012-07-05 ENCOUNTER — Telehealth: Payer: Self-pay | Admitting: Cardiovascular Disease

## 2012-07-05 NOTE — Telephone Encounter (Signed)
Spoke with pt, she developed an ache under the left breast today and noticed her bp 165/73. She did take ibuprofen for the ache under the breast and it is gone now. Explained to pt did not feel it was a heart related pain. Also dicussed with her about her elevated bp and felt it was due to the anxiety related to the ache in her breast. She agreed. She will cont to track her blood pressure at home and report a trend of elevated bp readings. She denies any salt increase. Pt will call back if cont to have elevated pressure. Pt agreed with this plan.

## 2012-07-05 NOTE — Telephone Encounter (Signed)
Pain under breast and BP has gone up  187/75, pls advise

## 2012-07-06 DIAGNOSIS — J069 Acute upper respiratory infection, unspecified: Secondary | ICD-10-CM | POA: Diagnosis not present

## 2012-07-21 DIAGNOSIS — Z853 Personal history of malignant neoplasm of breast: Secondary | ICD-10-CM | POA: Diagnosis not present

## 2012-07-21 DIAGNOSIS — Z09 Encounter for follow-up examination after completed treatment for conditions other than malignant neoplasm: Secondary | ICD-10-CM | POA: Diagnosis not present

## 2012-07-27 DIAGNOSIS — H40039 Anatomical narrow angle, unspecified eye: Secondary | ICD-10-CM | POA: Diagnosis not present

## 2012-07-27 DIAGNOSIS — H2510 Age-related nuclear cataract, unspecified eye: Secondary | ICD-10-CM | POA: Diagnosis not present

## 2012-08-04 ENCOUNTER — Other Ambulatory Visit (HOSPITAL_BASED_OUTPATIENT_CLINIC_OR_DEPARTMENT_OTHER): Payer: Medicare Other | Admitting: Lab

## 2012-08-04 ENCOUNTER — Encounter: Payer: Self-pay | Admitting: Oncology

## 2012-08-04 ENCOUNTER — Ambulatory Visit (HOSPITAL_BASED_OUTPATIENT_CLINIC_OR_DEPARTMENT_OTHER): Payer: Medicare Other | Admitting: Oncology

## 2012-08-04 VITALS — BP 138/82 | HR 79 | Temp 98.5°F | Resp 20 | Ht 61.5 in | Wt 117.6 lb

## 2012-08-04 DIAGNOSIS — Z170 Estrogen receptor positive status [ER+]: Secondary | ICD-10-CM | POA: Diagnosis not present

## 2012-08-04 DIAGNOSIS — C50419 Malignant neoplasm of upper-outer quadrant of unspecified female breast: Secondary | ICD-10-CM

## 2012-08-04 DIAGNOSIS — H2510 Age-related nuclear cataract, unspecified eye: Secondary | ICD-10-CM | POA: Diagnosis not present

## 2012-08-04 DIAGNOSIS — I10 Essential (primary) hypertension: Secondary | ICD-10-CM | POA: Diagnosis not present

## 2012-08-04 LAB — CBC WITH DIFFERENTIAL/PLATELET
BASO%: 0.6 % (ref 0.0–2.0)
Basophils Absolute: 0 10*3/uL (ref 0.0–0.1)
EOS%: 1.2 % (ref 0.0–7.0)
MCH: 29.9 pg (ref 25.1–34.0)
MCHC: 34.6 g/dL (ref 31.5–36.0)
MCV: 86.5 fL (ref 79.5–101.0)
MONO%: 9.9 % (ref 0.0–14.0)
RBC: 4.61 10*6/uL (ref 3.70–5.45)
RDW: 13.5 % (ref 11.2–14.5)

## 2012-08-04 LAB — COMPREHENSIVE METABOLIC PANEL (CC13)
ALT: 19 U/L (ref 0–55)
AST: 13 U/L (ref 5–34)
Albumin: 3.9 g/dL (ref 3.5–5.0)
Alkaline Phosphatase: 81 U/L (ref 40–150)
BUN: 9.9 mg/dL (ref 7.0–26.0)
Potassium: 3.7 mEq/L (ref 3.5–5.1)
Sodium: 136 mEq/L (ref 136–145)

## 2012-08-04 NOTE — Patient Instructions (Addendum)
Doing well with arimidex daily  I will see you back in 6 month

## 2012-08-04 NOTE — Progress Notes (Signed)
OFFICE PROGRESS NOTE  CC Dr. Rudi Heap or Dr. Claud Kelp Dr. Chipper Herb  DIAGNOSIS: 72 year old female with stage I invasive ductal carcinoma of the left breast status post left lumpectomy on 12/25/2010.  PRIOR THERAPY:  #1 patient was found to have on a screening mammogram in July 2012 an abnormality in the left breast. She went on to have a core needle biopsy that showed an invasive ductal carcinoma with associated high-grade ductal carcinoma in situ. The tumor was estrogen receptor +80% progesterone receptor +10% proliferation marker 16%.  #2 patient underwent a left breast lumpectomy with a sentinel node procedure on 12/25/2010. The final pathology revealed a T1 C. N0) stage I invasive ductal carcinoma measuring less than 0.1 cm with associated high-grade ductal carcinoma in situ. ER positive PR positive with a proliferation marker of 16%.  #3 Patient is now status post radiation therapy to the left breast by Dr. Ballard Russell.  #4 patient on adjuvant Arimidex 1 mg daily. A total of 5 years of therapy is planned.  CURRENT THERAPY: Arimidex 1 mg daily starting 04/26/2011.   INTERVAL HISTORY: Deborah Lowe 73 y.o. female returns for followup visit today. She is doing very well. She is without any complaints related to her medications. She has no nausea or vomiting, no hot flashes, no pain anywhere. Remainder of the 10 point review of systems is negative.  MEDICAL HISTORY: Past Medical History  Diagnosis Date  . Hypertension   . Wears glasses   . Ringing in ears   . Palpitations     on beta blocker therapy  . Breast cancer     s/p lumpectomy of left breast with XRT    ALLERGIES:  is allergic to avelox.  MEDICATIONS:  Current Outpatient Prescriptions  Medication Sig Dispense Refill  . anastrozole (ARIMIDEX) 1 MG tablet TAKE 1 TABLET (1 MG TOTAL) BY MOUTH DAILY.  30 tablet  11  . losartan-hydrochlorothiazide (HYZAAR) 100-12.5 MG per tablet Take 1 tablet by mouth  daily.      . metoprolol succinate (TOPROL-XL) 25 MG 24 hr tablet Take 1 tablet (25 mg total) by mouth daily.  90 tablet  3  . Multiple Vitamins-Minerals (MULTIVITAMIN WITH MINERALS) tablet Take 1 tablet by mouth daily.         No current facility-administered medications for this visit.    SURGICAL HISTORY:  Past Surgical History  Procedure Laterality Date  . Cholecystectomy    . Tear duct probing    . Cardiac catheterization  01/29/2007  . Cardiovascular stress test  07/19/2002    EF 84%    REVIEW OF SYSTEMS:  A comprehensive review of systems was negative.   PHYSICAL EXAMINATION: General appearance: alert, cooperative and appears stated age Neck: no adenopathy, no carotid bruit, no JVD, supple, symmetrical, trachea midline and thyroid not enlarged, symmetric, no tenderness/mass/nodules Lymph nodes: Cervical, supraclavicular, and axillary nodes normal. Resp: clear to auscultation bilaterally and normal percussion bilaterally Back: symmetric, no curvature. ROM normal. No CVA tenderness. Cardio: regular rate and rhythm, S1, S2 normal, no murmur, click, rub or gallop and normal apical impulse GI: soft, non-tender; bowel sounds normal; no masses,  no organomegaly Extremities: extremities normal, atraumatic, no cyanosis or edema Neurologic: Alert and oriented X 3, normal strength and tone. Normal symmetric reflexes. Normal coordination and gait Bilateral breast examination is performed. Left breast reveals well-healed surgical scar there is darkening of his skin secondary to radiation therapy there is no nipple retraction or discharge. There is some thickening  of skin under the surgical scar. Right breast no masses or nipple discharge or or retraction or inversion ECOG PERFORMANCE STATUS: 0 - Asymptomatic  Blood pressure 138/82, pulse 79, temperature 98.5 F (36.9 C), temperature source Oral, resp. rate 20, height 5' 1.5" (1.562 m), weight 117 lb 9.6 oz (53.343 kg).  LABORATORY DATA: Lab  Results  Component Value Date   WBC 5.0 08/04/2012   HGB 13.8 08/04/2012   HCT 39.8 08/04/2012   MCV 86.5 08/04/2012   PLT 253 08/04/2012      Chemistry      Component Value Date/Time   NA 136 08/04/2012 1010   NA 134* 06/18/2012 0841   K 3.7 08/04/2012 1010   K 3.7 06/18/2012 0841   CL 97* 08/04/2012 1010   CL 97 06/18/2012 0841   CO2 29 08/04/2012 1010   CO2 31 06/18/2012 0841   BUN 9.9 08/04/2012 1010   BUN 12 06/18/2012 0841   CREATININE 0.8 08/04/2012 1010   CREATININE 0.6 06/18/2012 0841      Component Value Date/Time   CALCIUM 9.6 08/04/2012 1010   CALCIUM 9.6 06/18/2012 0841   ALKPHOS 81 08/04/2012 1010   ALKPHOS 70 06/03/2012 0959   AST 13 08/04/2012 1010   AST 11 06/03/2012 0959   ALT 19 08/04/2012 1010   ALT 17 06/03/2012 0959   BILITOT 0.96 08/04/2012 1010   BILITOT 1.1 06/03/2012 0959       RADIOGRAPHIC STUDIES:  No results found.  ASSESSMENT: 72 year old female with  #1 stage I invasive ductal carcinoma of the left breast status post left lumpectomy on 12/25/2010. The final pathology revealed a 0.1 cm invasive ductal carcinoma with associated high-grade ductal carcinoma in situ ER positive PR positive with a proliferation marker of 16%. Patient is now status post radiation therapy to the breast.  #2. Now on Arimidex since 04/25/11 tolerating it well.  #3 hypertension   PLAN:   #1 patient  now on  adjuvant Arimidex 1 mg daily for 5 years. Risks and benefits of treatment are discussed very clearly with the patient and her husband. I discussed side effects with them. Overall she is tolerating it very well. She is without any significant side effects from it.  #2 hypertension I have discussed monitoring her blood pressures. If her blood pressures remain high she is to call her primary care physician. She becomes symptomatic she is to report to the nearest emergency room.  #3 patient will be seen back in 6 months time for followup with me. However she knows to call me sooner if  any concerns arise.   All questions were answered. The patient knows to call the clinic with any problems, questions or concerns. We can certainly see the patient much sooner if necessary.  I spent 25 minutes counseling the patient face to face. The total time spent in the appointment was 40 minutes.    Drue Second, MD Medical/Oncology [facility] [phone] (beeper) [phone] (Office)  08/04/2012, 11:15 AM

## 2012-08-18 DIAGNOSIS — H2510 Age-related nuclear cataract, unspecified eye: Secondary | ICD-10-CM | POA: Diagnosis not present

## 2012-08-18 DIAGNOSIS — H40039 Anatomical narrow angle, unspecified eye: Secondary | ICD-10-CM | POA: Diagnosis not present

## 2012-09-08 DIAGNOSIS — H2510 Age-related nuclear cataract, unspecified eye: Secondary | ICD-10-CM | POA: Diagnosis not present

## 2012-09-13 ENCOUNTER — Other Ambulatory Visit: Payer: Self-pay | Admitting: *Deleted

## 2012-09-13 ENCOUNTER — Encounter: Payer: Self-pay | Admitting: *Deleted

## 2012-09-13 DIAGNOSIS — H2510 Age-related nuclear cataract, unspecified eye: Secondary | ICD-10-CM | POA: Diagnosis not present

## 2012-09-13 MED ORDER — LOSARTAN POTASSIUM-HCTZ 100-12.5 MG PO TABS: 1.0000 | ORAL_TABLET | Freq: Every day | ORAL | Status: DC

## 2012-10-11 DIAGNOSIS — H2510 Age-related nuclear cataract, unspecified eye: Secondary | ICD-10-CM | POA: Diagnosis not present

## 2012-12-07 ENCOUNTER — Encounter: Payer: Self-pay | Admitting: Nurse Practitioner

## 2012-12-07 ENCOUNTER — Ambulatory Visit (INDEPENDENT_AMBULATORY_CARE_PROVIDER_SITE_OTHER): Payer: Medicare Other | Admitting: Nurse Practitioner

## 2012-12-07 VITALS — BP 160/88 | HR 56 | Ht 61.5 in | Wt 115.8 lb

## 2012-12-07 DIAGNOSIS — Z79899 Other long term (current) drug therapy: Secondary | ICD-10-CM | POA: Diagnosis not present

## 2012-12-07 DIAGNOSIS — I10 Essential (primary) hypertension: Secondary | ICD-10-CM

## 2012-12-07 DIAGNOSIS — E785 Hyperlipidemia, unspecified: Secondary | ICD-10-CM | POA: Diagnosis not present

## 2012-12-07 LAB — CBC WITH DIFFERENTIAL/PLATELET
Basophils Absolute: 0 10*3/uL (ref 0.0–0.1)
Basophils Relative: 0.3 % (ref 0.0–3.0)
Eosinophils Absolute: 0.1 10*3/uL (ref 0.0–0.7)
Eosinophils Relative: 1 % (ref 0.0–5.0)
HCT: 42 % (ref 36.0–46.0)
Hemoglobin: 14.3 g/dL (ref 12.0–15.0)
Lymphocytes Relative: 31.4 % (ref 12.0–46.0)
Lymphs Abs: 1.8 10*3/uL (ref 0.7–4.0)
MCHC: 34 g/dL (ref 30.0–36.0)
MCV: 88.5 fl (ref 78.0–100.0)
Monocytes Absolute: 0.5 10*3/uL (ref 0.1–1.0)
Monocytes Relative: 7.9 % (ref 3.0–12.0)
Neutro Abs: 3.4 10*3/uL (ref 1.4–7.7)
Neutrophils Relative %: 59.4 % (ref 43.0–77.0)
Platelets: 271 10*3/uL (ref 150.0–400.0)
RBC: 4.74 Mil/uL (ref 3.87–5.11)
RDW: 13 % (ref 11.5–14.6)
WBC: 5.7 10*3/uL (ref 4.5–10.5)

## 2012-12-07 LAB — BASIC METABOLIC PANEL
BUN: 12 mg/dL (ref 6–23)
CO2: 31 mEq/L (ref 19–32)
Calcium: 9.7 mg/dL (ref 8.4–10.5)
Chloride: 98 mEq/L (ref 96–112)
Creatinine, Ser: 0.6 mg/dL (ref 0.4–1.2)
GFR: 97.59 mL/min (ref >60.00)
Glucose, Bld: 90 mg/dL (ref 70–99)
Potassium: 3.5 mEq/L (ref 3.5–5.1)
Sodium: 136 mEq/L (ref 135–145)

## 2012-12-07 LAB — HEPATIC FUNCTION PANEL
ALT: 19 U/L (ref 0–35)
AST: 13 U/L (ref 0–37)
Albumin: 4.5 g/dL (ref 3.5–5.2)
Alkaline Phosphatase: 69 U/L (ref 39–117)
Bilirubin, Direct: 0 mg/dL (ref 0.0–0.3)
Total Bilirubin: 1 mg/dL (ref 0.3–1.2)
Total Protein: 7.8 g/dL (ref 6.0–8.3)

## 2012-12-07 LAB — LDL CHOLESTEROL, DIRECT: Direct LDL: 140.5 mg/dL

## 2012-12-07 LAB — LIPID PANEL
Cholesterol: 213 mg/dL — ABNORMAL HIGH (ref 0–200)
HDL: 59.9 mg/dL (ref >39.00)
Total CHOL/HDL Ratio: 4
Triglycerides: 78 mg/dL (ref 0.0–149.0)
VLDL: 15.6 mg/dL (ref 0.0–40.0)

## 2012-12-07 NOTE — Progress Notes (Signed)
Deborah Lowe Date of Birth: Oct 13, 1940 Medical Record #[id number]  History of Present Illness: Deborah Lowe is seen back today for her 6 month check. Seen for Dr. Elease Hashimoto - former patient of Dr. Ronnald Nian. Has had prior breast cancer, HTN, palpitations and a history of normal coronaries and renal arteries from 2008 evaluation.   Last seen in January. Was doing ok.   Comes back today. She is here alone. She is fasting for labs. Has not had her medicines today. BP diary from home is reviewed and her readings are excellent. She is tolerating her medicines. No chest pain. Not short of breath. No dizziness or lightheadedness.   Current Outpatient Prescriptions  Medication Sig Dispense Refill  . anastrozole (ARIMIDEX) 1 MG tablet TAKE 1 TABLET (1 MG TOTAL) BY MOUTH DAILY.  30 tablet  11  . losartan-hydrochlorothiazide (HYZAAR) 100-12.5 MG per tablet Take 1 tablet by mouth daily.  30 tablet  2  . metoprolol succinate (TOPROL-XL) 25 MG 24 hr tablet Take 1 tablet (25 mg total) by mouth daily.  90 tablet  3  . Multiple Vitamins-Minerals (MULTIVITAMIN WITH MINERALS) tablet Take 1 tablet by mouth daily.         No current facility-administered medications for this visit.    Allergies  Allergen Reactions  . Avelox (Moxifloxacin Hcl In Nacl) Anaphylaxis    Past Medical History  Diagnosis Date  . Hypertension   . Wears glasses   . Ringing in ears   . Palpitations     on beta blocker therapy  . Breast cancer     s/p lumpectomy of left breast with XRT    Past Surgical History  Procedure Laterality Date  . Cholecystectomy    . Tear duct probing    . Cardiac catheterization  01/29/2007  . Cardiovascular stress test  07/19/2002    EF 84%    History  Smoking status  . Never Smoker   Smokeless tobacco  . Never Used    History  Alcohol Use No    Family History  Problem Relation Age of Onset  . Cancer Mother     colon    Review of Systems: The review of systems is per the HPI.   All other systems were reviewed and are negative.  Physical Exam: BP 160/88  Pulse 56  Ht 5' 1.5" (1.562 m)  Wt 115 lb 12.8 oz (52.527 kg)  BMI 21.53 kg/m2 Patient is very pleasant and in no acute distress. Skin is warm and dry. Color is normal.  HEENT is unremarkable. Normocephalic/atraumatic. PERRL. Sclera are nonicteric. Neck is supple. No masses. No JVD. Lungs are clear. Cardiac exam shows a regular rate and rhythm. Abdomen is soft. Extremities are without edema. Gait and ROM are intact. No gross neurologic deficits noted.  LABORATORY DATA: PENDING  Lab Results  Component Value Date   WBC 5.0 08/04/2012   HGB 13.8 08/04/2012   HCT 39.8 08/04/2012   PLT 253 08/04/2012   GLUCOSE 94 08/04/2012   CHOL 194 06/03/2012   TRIG 80.0 06/03/2012   HDL 54.70 06/03/2012   LDLCALC 123* 06/03/2012   ALT 19 08/04/2012   AST 13 08/04/2012   NA 136 08/04/2012   K 3.7 08/04/2012   CL 97* 08/04/2012   CREATININE 0.8 08/04/2012   BUN 9.9 08/04/2012   CO2 29 08/04/2012   TSH 2.93 06/18/2012     Assessment / Plan: 1. HTN - BP diary from home shows great control. I have left her on  her current regimen. See her back in one year.   2. Palpitations - basically resolved.   Will check fasting labs today. I will see her back in a year. She can have refills on her medicines for the next year as well.   Patient is agreeable to this plan and will call if any problems develop in the interim.   Rosalio Macadamia, RN, ANP-C  [facility] [address] [place], [place]  [address]

## 2012-12-07 NOTE — Patient Instructions (Addendum)
I think you are doing well  Stay on your current medicines  We will check labs today  Monitor your blood pressure at home as you have been doing  I will see you in a year.  Call the [facility] office at [phone] if you have any questions, problems or concerns.

## 2012-12-13 ENCOUNTER — Other Ambulatory Visit: Payer: Self-pay | Admitting: Nurse Practitioner

## 2012-12-15 ENCOUNTER — Other Ambulatory Visit: Payer: Self-pay | Admitting: Cardiovascular Disease

## 2012-12-17 ENCOUNTER — Other Ambulatory Visit: Payer: Self-pay | Admitting: Nurse Practitioner

## 2012-12-23 ENCOUNTER — Encounter: Payer: Self-pay | Admitting: General Practice

## 2012-12-23 ENCOUNTER — Ambulatory Visit (INDEPENDENT_AMBULATORY_CARE_PROVIDER_SITE_OTHER): Payer: Medicare Other | Admitting: General Practice

## 2012-12-23 ENCOUNTER — Telehealth: Payer: Self-pay | Admitting: Nurse Practitioner

## 2012-12-23 VITALS — BP 162/79 | HR 71 | Temp 97.7°F | Ht 61.5 in | Wt 118.0 lb

## 2012-12-23 DIAGNOSIS — R5383 Other fatigue: Secondary | ICD-10-CM | POA: Diagnosis not present

## 2012-12-23 DIAGNOSIS — J322 Chronic ethmoidal sinusitis: Secondary | ICD-10-CM

## 2012-12-23 MED ORDER — AZITHROMYCIN 250 MG PO TABS: ORAL_TABLET | ORAL | Status: DC

## 2012-12-23 NOTE — Telephone Encounter (Signed)
appt given for today 

## 2012-12-23 NOTE — Progress Notes (Signed)
  Subjective:    Patient ID: Deborah Lowe, female    DOB: 1940/09/16, 72 y.o.   MRN: [id number]  Sinusitis This is a new problem. The current episode started in the past 7 days. The problem has been gradually worsening since onset. There has been no fever. Her pain is at a severity of 7/10. Associated symptoms include headaches, sinus pressure and a sore throat. Pertinent negatives include no chills, congestion, coughing, neck pain, shortness of breath or sneezing. Past treatments include nothing.  Patient reports feeling more tired for past month. She has a history of enlarged thyroid and being follow by her ENT physician. She reports it is time for her thyroid ultrasound and will call her doctor to schedule.     Review of Systems  Constitutional: Negative for fever and chills.  HENT: Positive for sore throat and sinus pressure. Negative for congestion, sneezing, neck pain, neck stiffness and postnasal drip.   Respiratory: Negative for cough, chest tightness and shortness of breath.   Cardiovascular: Negative for chest pain and palpitations.  Genitourinary: Negative for difficulty urinating.  Neurological: Positive for headaches. Negative for dizziness and weakness.       Objective:   Physical Exam  Constitutional: She is oriented to person, place, and time. She appears well-developed and well-nourished.  HENT:  Head: Normocephalic and atraumatic.  Right Ear: External ear normal.  Left Ear: External ear normal.  Nose: Right sinus exhibits maxillary sinus tenderness and frontal sinus tenderness. Left sinus exhibits maxillary sinus tenderness and frontal sinus tenderness.  Mouth/Throat: Posterior oropharyngeal erythema present.  Eyes: EOM are normal. Pupils are equal, round, and reactive to light.  Neck: Normal range of motion. Neck supple. No thyromegaly present.  Cardiovascular: Normal rate, regular rhythm and normal heart sounds.   Pulmonary/Chest: Effort normal and breath sounds  normal. No respiratory distress. She exhibits no tenderness.  Lymphadenopathy:    She has no cervical adenopathy.  Neurological: She is alert and oriented to person, place, and time.  Skin: Skin is warm and dry.  Psychiatric: She has a normal mood and affect.          Assessment & Plan:  1. Ethmoid sinusitis - azithromycin (ZITHROMAX) 250 MG tablet; Take as directed  Dispense: 6 tablet; Refill: 0 -increase fluids -new toothbrush 3 days after starting antibiotics   2. Tiredness - Thyroid Panel With TSH -RTO if symptoms worsen or seek emergency medical treatment -Patient verbalized understanding -Coralie Keens, FNP-C

## 2012-12-23 NOTE — Patient Instructions (Addendum)

## 2012-12-24 LAB — THYROID PANEL WITH TSH
Free Thyroxine Index: 2.2 (ref 1.2–4.9)
T3 Uptake Ratio: 27 % (ref 24–39)
TSH: 2.46 u[IU]/mL (ref 0.450–4.500)

## 2013-01-03 ENCOUNTER — Encounter: Payer: Self-pay | Admitting: Oncology

## 2013-01-03 ENCOUNTER — Ambulatory Visit (HOSPITAL_BASED_OUTPATIENT_CLINIC_OR_DEPARTMENT_OTHER): Payer: Medicare Other | Admitting: Oncology

## 2013-01-03 ENCOUNTER — Telehealth: Payer: Self-pay | Admitting: Oncology

## 2013-01-03 ENCOUNTER — Other Ambulatory Visit (HOSPITAL_BASED_OUTPATIENT_CLINIC_OR_DEPARTMENT_OTHER): Payer: Medicare Other | Admitting: Lab

## 2013-01-03 VITALS — BP 171/73 | HR 62 | Temp 98.0°F | Resp 18 | Ht 61.0 in | Wt 117.5 lb

## 2013-01-03 DIAGNOSIS — C50419 Malignant neoplasm of upper-outer quadrant of unspecified female breast: Secondary | ICD-10-CM

## 2013-01-03 DIAGNOSIS — I10 Essential (primary) hypertension: Secondary | ICD-10-CM | POA: Diagnosis not present

## 2013-01-03 DIAGNOSIS — Z170 Estrogen receptor positive status [ER+]: Secondary | ICD-10-CM

## 2013-01-03 LAB — COMPREHENSIVE METABOLIC PANEL (CC13)
ALT: 14 U/L (ref 0–55)
AST: 13 U/L (ref 5–34)
Albumin: 3.8 g/dL (ref 3.5–5.0)
CO2: 28 mEq/L (ref 22–29)
Calcium: 9.6 mg/dL (ref 8.4–10.4)
Chloride: 98 mEq/L (ref 98–109)
Creatinine: 0.7 mg/dL (ref 0.6–1.1)
Potassium: 3.7 mEq/L (ref 3.5–5.1)
Total Protein: 7 g/dL (ref 6.4–8.3)

## 2013-01-03 LAB — CBC WITH DIFFERENTIAL/PLATELET
BASO%: 0.4 % (ref 0.0–2.0)
EOS%: 1.1 % (ref 0.0–7.0)
HCT: 38.6 % (ref 34.8–46.6)
HGB: 13.2 g/dL (ref 11.6–15.9)
MCH: 29.8 pg (ref 25.1–34.0)
MCHC: 34.2 g/dL (ref 31.5–36.0)
MONO#: 0.5 10*3/uL (ref 0.1–0.9)
NEUT%: 61.8 % (ref 38.4–76.8)
RDW: 12.9 % (ref 11.2–14.5)
WBC: 6.4 10*3/uL (ref 3.9–10.3)
lymph#: 1.9 10*3/uL (ref 0.9–3.3)

## 2013-01-03 NOTE — Patient Instructions (Addendum)
Doing well  Headaches: monitor your blood pressures at home, take tylenol as need  We will see you back in 6 month

## 2013-01-17 NOTE — Progress Notes (Signed)
OFFICE PROGRESS NOTE  CC Dr. Rudi Heap or Dr. Claud Kelp Dr. Chipper Herb  DIAGNOSIS: 72 year old female with stage I invasive ductal carcinoma of the left breast status post left lumpectomy on 12/25/2010.  PRIOR THERAPY:  #1 patient was found to have on a screening mammogram in July 2012 an abnormality in the left breast. She went on to have a core needle biopsy that showed an invasive ductal carcinoma with associated high-grade ductal carcinoma in situ. The tumor was estrogen receptor +80% progesterone receptor +10% proliferation marker 16%.  #2 patient underwent a left breast lumpectomy with a sentinel node procedure on 12/25/2010. The final pathology revealed a T1 C. N0) stage I invasive ductal carcinoma measuring less than 0.1 cm with associated high-grade ductal carcinoma in situ. ER positive PR positive with a proliferation marker of 16%.  #3 Patient is now status post radiation therapy to the left breast by Dr. Ballard Russell.  #4 patient on adjuvant Arimidex 1 mg daily. A total of 5 years of therapy is planned.  CURRENT THERAPY: Arimidex 1 mg daily starting 04/26/2011.   INTERVAL HISTORY: Deborah Lowe 72 y.o. female returns for followup visit today. She is doing very well. She is without any complaints related to her medications. She has no nausea or vomiting, no hot flashes, no pain anywhere. Remainder of the 10 point review of systems is negative.  MEDICAL HISTORY: Past Medical History  Diagnosis Date  . Hypertension   . Wears glasses   . Ringing in ears   . Palpitations     on beta blocker therapy  . Breast cancer 2012    s/p lumpectomy of left breast with XRT    ALLERGIES:  is allergic to avelox.  MEDICATIONS:  Current Outpatient Prescriptions  Medication Sig Dispense Refill  . anastrozole (ARIMIDEX) 1 MG tablet TAKE 1 TABLET (1 MG TOTAL) BY MOUTH DAILY.  30 tablet  11  . azithromycin (ZITHROMAX) 250 MG tablet Take as directed  6 tablet  0  .  losartan-hydrochlorothiazide (HYZAAR) 100-12.5 MG per tablet TAKE 1 TABLET BY MOUTH DAILY.  30 tablet  0  . metoprolol succinate (TOPROL-XL) 25 MG 24 hr tablet Take 1 tablet (25 mg total) by mouth daily.  90 tablet  3   No current facility-administered medications for this visit.    SURGICAL HISTORY:  Past Surgical History  Procedure Laterality Date  . Cholecystectomy    . Tear duct probing    . Cardiac catheterization  01/29/2007  . Cardiovascular stress test  07/19/2002    EF 84%    REVIEW OF SYSTEMS:  A comprehensive review of systems was negative.   PHYSICAL EXAMINATION: General appearance: alert, cooperative and appears 72 Neck: no adenopathy, no carotid bruit, no JVD, supple, symmetrical, trachea midline and thyroid not enlarged, symmetric, no tenderness/mass/nodules Lymph nodes: Cervical, supraclavicular, and axillary nodes normal. Resp: clear to auscultation bilaterally and normal percussion bilaterally Back: symmetric, no curvature. ROM normal. No CVA tenderness. Cardio: regular rate and rhythm, S1, S2 normal, no murmur, click, rub or gallop and normal apical impulse GI: soft, non-tender; bowel sounds normal; no masses,  no organomegaly Extremities: extremities normal, atraumatic, no cyanosis or edema Neurologic: Alert and oriented X 3, normal strength and tone. Normal symmetric reflexes. Normal coordination and gait Bilateral breast examination is performed. Left breast reveals well-healed surgical scar there is darkening of his skin secondary to radiation therapy there is no nipple retraction or discharge. There is some thickening of skin under the  surgical scar. Right breast no masses or nipple discharge or or retraction or inversion ECOG PERFORMANCE STATUS: 0 - Asymptomatic  Blood pressure 171/73, pulse 62, temperature 98 F (36.7 C), temperature source Oral, resp. rate 18, height 5\' 1"  (1.549 m), weight 117 lb 8 oz (53.298 kg).  LABORATORY DATA: Lab Results   Component Value Date   WBC 6.4 01/03/2013   HGB 13.2 01/03/2013   HCT 38.6 01/03/2013   MCV 87.1 01/03/2013   PLT 248 01/03/2013      Chemistry      Component Value Date/Time   NA 136 01/03/2013 1205   NA 136 12/07/2012 1029   K 3.7 01/03/2013 1205   K 3.5 12/07/2012 1029   CL 98 12/07/2012 1029   CL 97* 08/04/2012 1010   CO2 28 01/03/2013 1205   CO2 31 12/07/2012 1029   BUN 10.7 01/03/2013 1205   BUN 12 12/07/2012 1029   CREATININE 0.7 01/03/2013 1205   CREATININE 0.6 12/07/2012 1029      Component Value Date/Time   CALCIUM 9.6 01/03/2013 1205   CALCIUM 9.7 12/07/2012 1029   ALKPHOS 67 01/03/2013 1205   ALKPHOS 69 12/07/2012 1029   AST 13 01/03/2013 1205   AST 13 12/07/2012 1029   ALT 14 01/03/2013 1205   ALT 19 12/07/2012 1029   BILITOT 0.82 01/03/2013 1205   BILITOT 1.0 12/07/2012 1029       RADIOGRAPHIC STUDIES:  No results found.  ASSESSMENT: 72 year old female with  #1 stage I invasive ductal carcinoma of the left breast status post left lumpectomy on 12/25/2010. The final pathology revealed a 0.1 cm invasive ductal carcinoma with associated high-grade ductal carcinoma in situ ER positive PR positive with a proliferation marker of 16%. Patient is now status post radiation therapy to the breast.  #2. Now on Arimidex since 04/25/11 tolerating it well.  #3 hypertension   PLAN:   #1continue  adjuvant Arimidex 1 mg daily for 5 years.Overall she is tolerating it very well. She is without any significant side effects from it.  #2 hypertension I have discussed monitoring her blood pressures. If her blood pressures remain high she is to call her primary care physician. She becomes symptomatic she is to report to the nearest emergency room.  #3 patient will be seen back in 6 months time for followup with me. However she knows to call me sooner if any concerns arise.   All questions were answered. The patient knows to call the clinic with any problems, questions or concerns. We can  certainly see the patient much sooner if necessary.  I spent 25 minutes counseling the patient face to face. The total time spent in the appointment was 40 minutes.    Drue Second, MD Medical/Oncology [facility] [phone] (beeper) [phone] (Office)  01/17/2013, 1:52 AM

## 2013-01-27 DIAGNOSIS — Z853 Personal history of malignant neoplasm of breast: Secondary | ICD-10-CM | POA: Diagnosis not present

## 2013-01-31 DIAGNOSIS — R51 Headache: Secondary | ICD-10-CM | POA: Diagnosis not present

## 2013-02-24 ENCOUNTER — Ambulatory Visit (INDEPENDENT_AMBULATORY_CARE_PROVIDER_SITE_OTHER): Payer: Medicare Other | Admitting: General Surgery

## 2013-02-24 ENCOUNTER — Encounter (INDEPENDENT_AMBULATORY_CARE_PROVIDER_SITE_OTHER): Payer: Self-pay | Admitting: General Surgery

## 2013-02-24 VITALS — BP 110/70 | HR 68 | Temp 99.0°F | Resp 14 | Ht 62.0 in | Wt 117.0 lb

## 2013-02-24 DIAGNOSIS — C50412 Malignant neoplasm of upper-outer quadrant of left female breast: Secondary | ICD-10-CM

## 2013-02-24 DIAGNOSIS — C50419 Malignant neoplasm of upper-outer quadrant of unspecified female breast: Secondary | ICD-10-CM

## 2013-02-24 NOTE — Patient Instructions (Signed)
Examination of your breast and lymph node areas are normal. Your mammograms are also normal. There is no evidence of cancer.  Keep your regular appointments with Dr. Welton Flakes.  Continue to take the arimidex medication as prescribed  Be sure to get mammograms in one year, and see Dr. Derrell Lolling next year after the  mammograms.

## 2013-02-24 NOTE — Progress Notes (Signed)
Patient ID: Deborah Lowe, female   DOB: February 16, 1941, 72 y.o.   MRN: [id number] History: This patient returns for long-term followup regarding her breast cancer. On 12/25/2010 she underwent left partial mastectomy and sentinel node biopsy. She had ductal carcinoma in situ with microinvasion, sentinel nodes negative, ER-positive, HER-2-negative. Stage I breast cancer. She underwent adjuvant radiation therapy and is on adjuvant arimidex and she is being followed by Dr. Drue Second.  Recent mammograms  On 02/01/2013 looked fine. Category 2. The patient feels fine. She has noticed no change in her breast. She is in good spirits.  She has had some headaches following cataract surgery and has been referred to neurology.  ROS: 10 system review of systems is negative except as described above  Exam: Patient looks well. Good spirits. No distress Neck reveals no adenopathy or mass Lungs are clear to auscultation bilaterally Heart: Regular rate and rhythm. No murmur. No ectopy Breast well-healed scar left breast upper outer quadrant and left axilla. No palpable mass in either breast. Skin healthy. No axillary adenopathy.   Assessment  DCIS with microinvasion left breast, upper outer quadrant, receptor positive, HER-2-negative. Pathologic stage TI, N0, stage I.  No evidence of recurrence two years following left partial mastectomy sentinel node biopsy and radiation therapy and ongoing adjuvant arimidex therapy.  Fibrocystic disease right breast, status post image guided biopsy 2013  Plan: Continue arimidex and routine followup Dr. Drue Second Repeat mammogram in one year and see me at that time     [facility] Derrell Lolling, M.D., [facility] General and Minimally invasive Surgery Breast and Colorectal Surgery Office:   [phone] Pager:   [phone]

## 2013-02-27 IMAGING — CR DG CHEST 2V
2 series · 2 of 2 positions shown · non-contrast
Comparison: None

CLINICAL DATA: Preop breast surgery.  Hypertension.

CHEST - 2 VIEW

[view not recorded (1 of 2)]
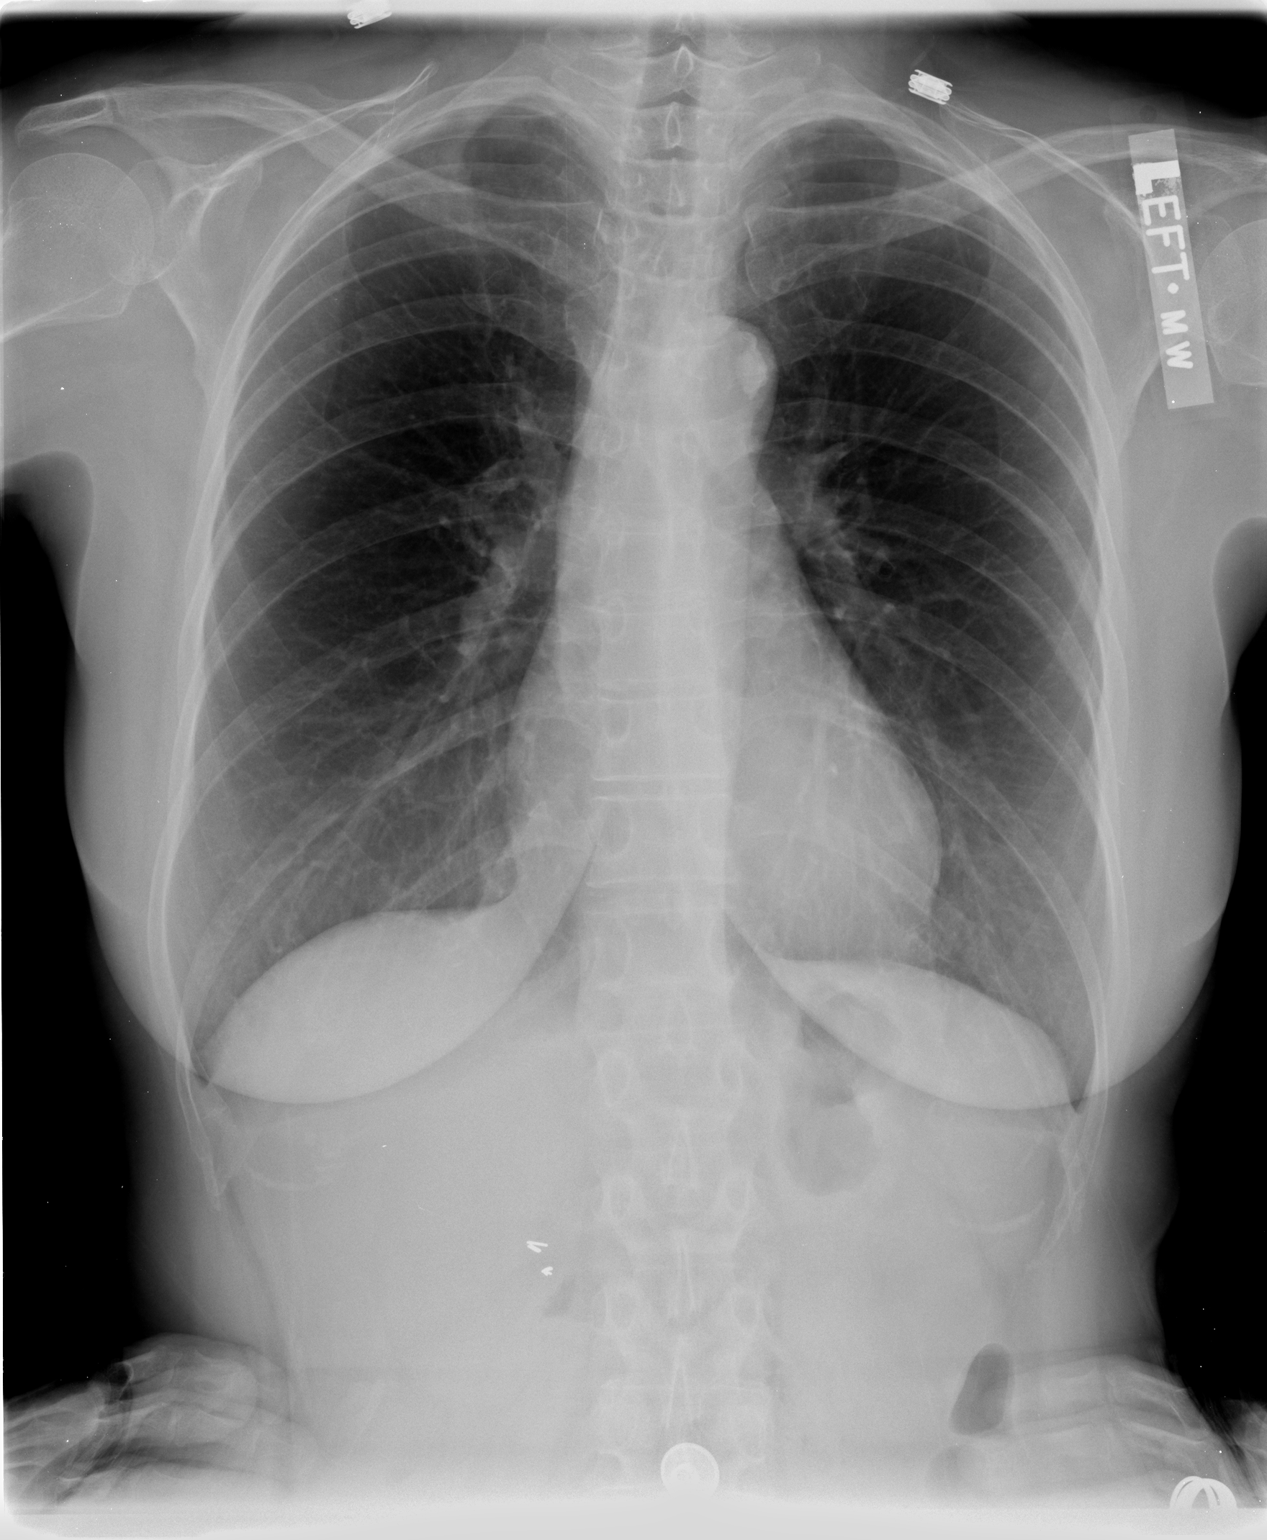

[view not recorded (2 of 2)]
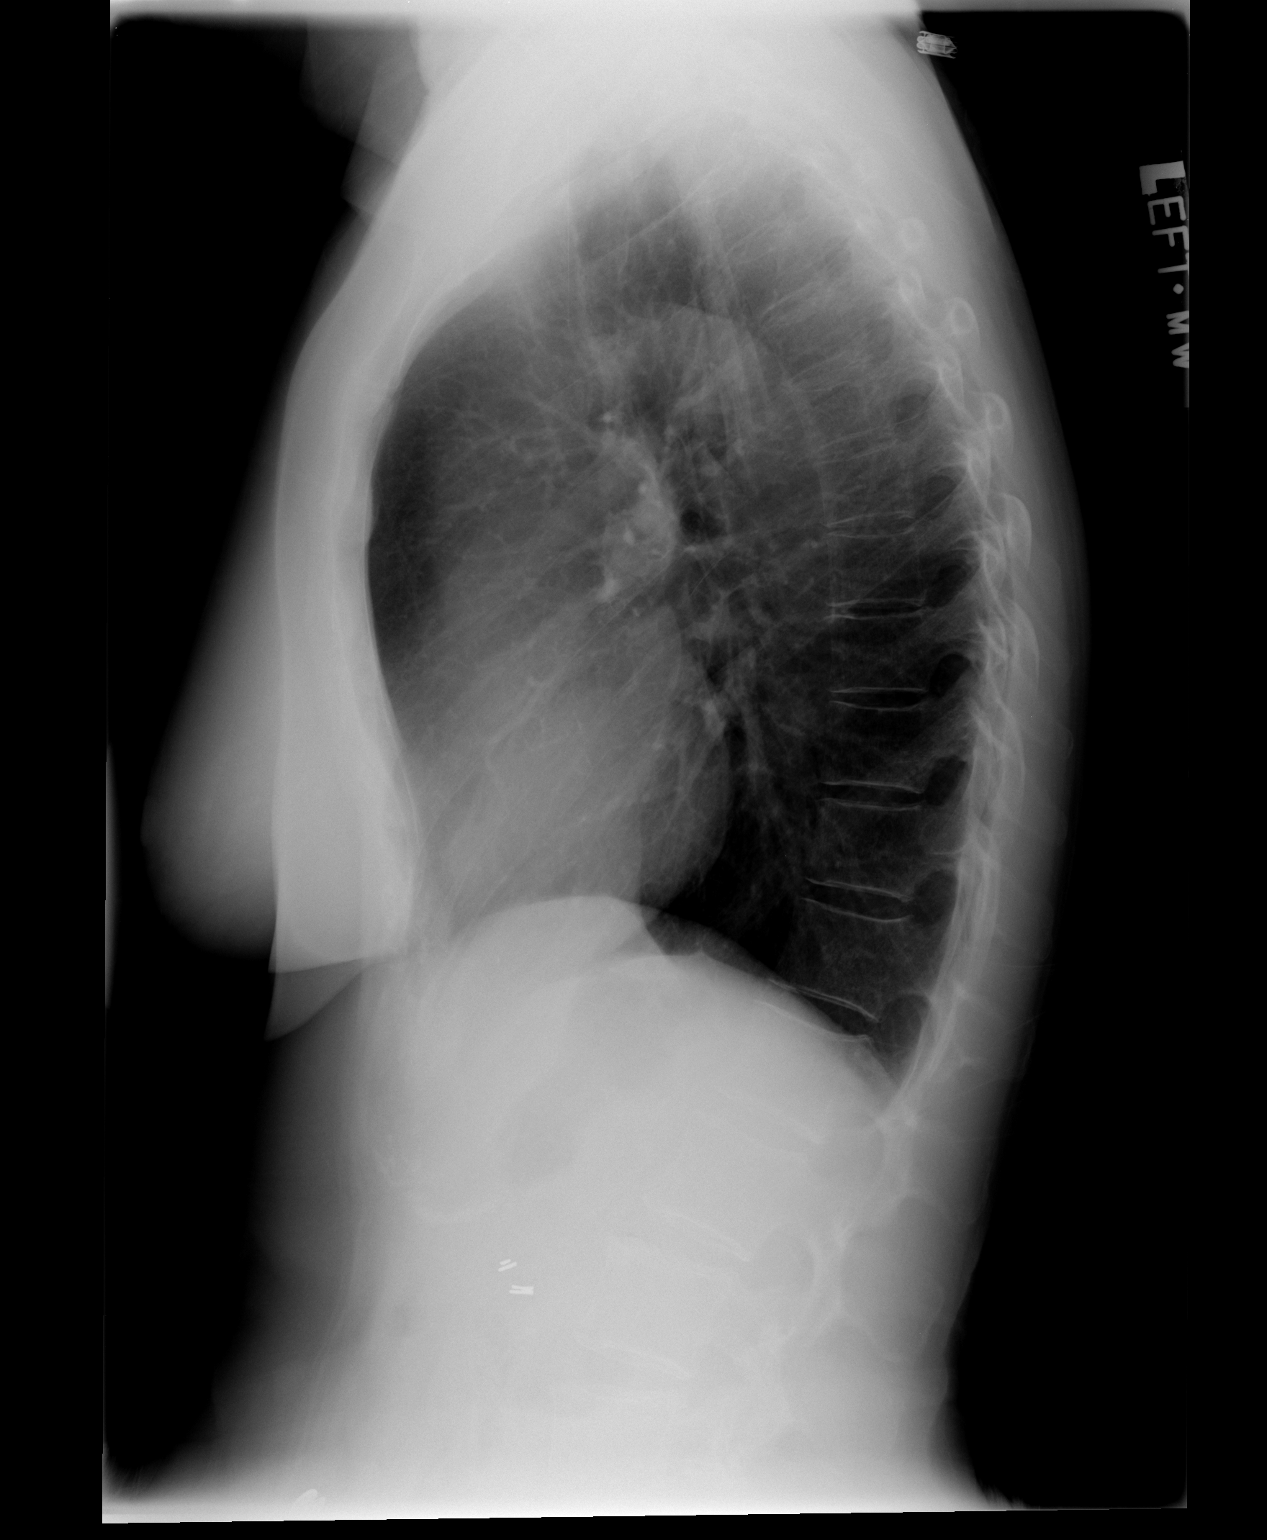

[2 of 2 positions shown; findings below may reference images not displayed]

FINDINGS: Heart and mediastinal contours are within normal limits.
No focal opacities or effusions.  No acute bony abnormality.
IMPRESSION: No active disease.

## 2013-03-07 ENCOUNTER — Ambulatory Visit (INDEPENDENT_AMBULATORY_CARE_PROVIDER_SITE_OTHER): Payer: Medicare Other | Admitting: Neurology

## 2013-03-07 ENCOUNTER — Encounter: Payer: Self-pay | Admitting: Neurology

## 2013-03-07 VITALS — BP 155/69 | HR 60 | Temp 97.3°F | Ht 62.0 in | Wt 117.0 lb

## 2013-03-07 DIAGNOSIS — G4733 Obstructive sleep apnea (adult) (pediatric): Secondary | ICD-10-CM | POA: Diagnosis not present

## 2013-03-07 DIAGNOSIS — R51 Headache: Secondary | ICD-10-CM

## 2013-03-07 DIAGNOSIS — R519 Headache, unspecified: Secondary | ICD-10-CM | POA: Insufficient documentation

## 2013-03-07 MED ORDER — DIVALPROEX SODIUM ER 500 MG PO TB24: 500.0000 mg | ORAL_TABLET | Freq: Every day | ORAL | Status: DC

## 2013-03-07 NOTE — Progress Notes (Signed)
[facility] [address] [place]. [place] [address] [phone]       OFFICE CONSULT NOTE  [name] Date of Birth:  [birth year] Medical Record Number:  [id number]   Referring MD:  Dr Pollyann Kennedy  Reason for Referral:  Headache  HPI:  35 year lady with headaches off and on x 1 year which occur 2-3 times per week lasting several hours.Headaches are pressure like in quality moderate 8/10 severity.occasional accompanying nausea but no light or sound sensitivity with no identified triggers.She has tried tylenol without relief but no other medicines.She was seen by Dr Pollyann Kennedy recently for evaluation for sinus headaches but found to have to significant sinus disease contributing to her headaches.She has not had any brain imaging studies yet.She denies any accompanying vision symptoms, diplopia, focal weakness, gait or balance problems. She complains of excessive day time sleepiness, fatigue, snoring, disturbed night sleep but has not been evaluated for sleep apnoea.Shehas no h/o migraines but daughter has migraines  ROS:   14 system review of systems is positive for fatigue,palpititions,ringing in ears,double vision,feeling cold,headache, dizziness, sleepiness,decreased energy,snoring  PMH:  Past Medical History  Diagnosis Date  . Hypertension   . Wears glasses   . Ringing in ears   . Palpitations     on beta blocker therapy  . Breast cancer 2012    s/p lumpectomy of left breast with XRT    Social History:  History   Social History  . Marital Status: Married    Spouse Name: terry    Number of Children: 1  . Years of Education: 12   Occupational History  . retired    Social History Main Topics  . Smoking status: Never Smoker   . Smokeless tobacco: Never Used  . Alcohol Use: No  . Drug Use: No  . Sexual Activity: Yes    Birth Control/ Protection: Post-menopausal   Other Topics Concern  . Not on file   Social History Narrative  . No narrative on file     Medications:   Current Outpatient Prescriptions on File Prior to Visit  Medication Sig Dispense Refill  . anastrozole (ARIMIDEX) 1 MG tablet TAKE 1 TABLET (1 MG TOTAL) BY MOUTH DAILY.  30 tablet  11  . losartan-hydrochlorothiazide (HYZAAR) 100-12.5 MG per tablet TAKE 1 TABLET BY MOUTH DAILY.  30 tablet  0  . metoprolol succinate (TOPROL-XL) 25 MG 24 hr tablet Take 1 tablet (25 mg total) by mouth daily.  90 tablet  3   No current facility-administered medications on file prior to visit.    Allergies:   Allergies  Allergen Reactions  . Avelox [Moxifloxacin Hcl In Nacl] Anaphylaxis    Physical Exam General: well developed, well nourished, seated, in no evident distress Head: head normocephalic and atraumatic. Orohparynx benign Neck: supple with no carotid or supraclavicular bruits Cardiovascular: regular rate and rhythm, no murmurs Musculoskeletal: no deformity Skin:  no rash/petichiae Vascular:  Normal pulses all extremities Filed Vitals:   03/07/13 1344  BP: 155/69  Pulse: 60  Temp: 97.3 F (36.3 C)    Neurologic Exam Mental Status: Awake and fully alert. Oriented to place and time. Recent and remote memory intact. Attention span, concentration and fund of knowledge appropriate. Mood and affect appropriate.  Cranial Nerves: Fundoscopic exam reveals sharp disc margins. Pupils equal, briskly reactive to light. Extraocular movements full without nystagmus. Visual fields full to confrontation. Hearing intact. Facial sensation intact. Face, tongue, palate moves normally and symmetrically.  Motor: Normal bulk  and tone. Normal strength in all tested extremity muscles. Sensory.: intact to tough and pinprick and vibratory.  Coordination: Rapid alternating movements normal in all extremities. Finger-to-nose and heel-to-shin performed accurately bilaterally. Gait and Station: Arises from chair without difficulty. Stance is normal. Gait demonstrates normal stride length and balance .  Able to heel, toe and tandem walk without difficulty.  Reflexes: 1+ and symmetric. Toes downgoing.     ASSESSMENT: 86 year lady with chronic headaches likely tension headaches.Normal neurological exam. Given h/o cancer need to evaluate for structural brain lesions and given h/o snoring and sleepiness evaluate for sleep apnoea    PLAN:  Start Depakote ER 500 mg daily for headache prophylaxis. Use Tylenol Extra Strength 2 tablets as needed for headaches up to 3 times a day but not more than 3 days a week. Check polysomnogram for sleep apnea an MRI scan of the brain with and without contrast for structural brain lesions. I advised the patient to participate in stress relaxation activities like regular exercise, walking, swimming, meditation and yoga. Return for followup in 6 weeks or earlier if necessary.

## 2013-03-07 NOTE — Patient Instructions (Signed)
Start Depakote ER 500 mg daily for headache prophylaxis. Use Tylenol Extra Strength 2 tablets as needed for headaches up to 3 times a day but not more than 3 days a week. Check polysomnogram for sleep apnea an MRI scan of the brain with and without contrast for structural brain lesions. I advised the patient to participate in stress relaxation activities like regular exercise, walking, swimming, meditation and yoga. Return for followup in 6 weeks or earlier if necessary.

## 2013-03-16 ENCOUNTER — Ambulatory Visit
Admission: RE | Admit: 2013-03-16 | Discharge: 2013-03-16 | Disposition: A | Discharge status: Home / Self Care | Payer: Medicare Other | Source: Ambulatory Visit | Attending: Neurology | Admitting: Neurology

## 2013-03-16 DIAGNOSIS — R51 Headache: Secondary | ICD-10-CM | POA: Diagnosis not present

## 2013-03-16 DIAGNOSIS — G4733 Obstructive sleep apnea (adult) (pediatric): Secondary | ICD-10-CM

## 2013-03-16 MED ORDER — GADOBENATE DIMEGLUMINE 529 MG/ML IV SOLN
10.0000 mL | Freq: Once | INTRAVENOUS | Status: AC | PRN
  Administered 2013-03-16: 10 mL via INTRAVENOUS

## 2013-03-23 ENCOUNTER — Telehealth: Payer: Self-pay | Admitting: Neurology

## 2013-03-23 DIAGNOSIS — R51 Headache: Secondary | ICD-10-CM

## 2013-03-23 DIAGNOSIS — G479 Sleep disorder, unspecified: Secondary | ICD-10-CM

## 2013-03-23 DIAGNOSIS — I10 Essential (primary) hypertension: Secondary | ICD-10-CM

## 2013-03-23 DIAGNOSIS — R0683 Snoring: Secondary | ICD-10-CM

## 2013-03-23 NOTE — Telephone Encounter (Signed)
Patient called wanting to know the results of her MRI scan that she had on last Wednesday.

## 2013-03-23 NOTE — Telephone Encounter (Signed)
After reviewing the sleep study referral, I entered a split night sleep study request on this patient, thanks.  Eleftherios Dudenhoeffer, MD, PhD [facility] ([facility])    

## 2013-03-23 NOTE — Telephone Encounter (Signed)
refers patient for attended sleep study by Dr. Pearlean Brownie  Height: 5'2  Weight: 117 lbs.  BMI: 21.39  Past Medical History: Hypertension, ringing in ears, palpitations, breast cancer and wears glasses  Sleep Symptoms: She complains of excessive day time sleepiness, fatigue, snoring, disturbed night sleep    Epworth Score: 3  Medications:Anastrozole (Tab) ARIMIDEX 1 MG TAKE 1 TABLET (1 MG TOTAL) BY MOUTH DAILY. Divalproex Sodium (Tablet SR 24 hr) DEPAKOTE ER 500 MG Take 1 tablet (500 mg total) by mouth daily. Losartan Potassium-HCTZ (Tab) HYZAAR 100-12.5 MG TAKE 1 TABLET BY MOUTH DAILY. Metoprolol Succinate (Tablet SR 24 hr) TOPROL-XL 25 MG Take 1 tablet (25 mg total) by mouth daily.    Insurance: Medicare/BCBS-Federal  Please review patient information and submit instructions for scheduling and orders for sleep technologist.  Thank you!

## 2013-03-24 NOTE — Telephone Encounter (Signed)
Patient is scheduled for 04-02-13@9 :30pm

## 2013-03-25 NOTE — Telephone Encounter (Signed)
Results given to pt by Dr. Pearlean Brownie.

## 2013-03-31 ENCOUNTER — Other Ambulatory Visit: Payer: Self-pay | Admitting: *Deleted

## 2013-03-31 DIAGNOSIS — R51 Headache: Secondary | ICD-10-CM

## 2013-03-31 DIAGNOSIS — G4733 Obstructive sleep apnea (adult) (pediatric): Secondary | ICD-10-CM

## 2013-04-01 ENCOUNTER — Telehealth: Payer: Self-pay | Admitting: *Deleted

## 2013-04-01 NOTE — Telephone Encounter (Signed)
I called pt , no answer, re: lab work requested by Dr. Pearlean Brownie.

## 2013-04-02 ENCOUNTER — Ambulatory Visit (INDEPENDENT_AMBULATORY_CARE_PROVIDER_SITE_OTHER): Payer: Medicare Other | Admitting: Neurology

## 2013-04-02 DIAGNOSIS — G4733 Obstructive sleep apnea (adult) (pediatric): Secondary | ICD-10-CM

## 2013-04-02 DIAGNOSIS — G479 Sleep disorder, unspecified: Secondary | ICD-10-CM | POA: Diagnosis not present

## 2013-04-02 DIAGNOSIS — R51 Headache: Secondary | ICD-10-CM

## 2013-04-02 DIAGNOSIS — R0683 Snoring: Secondary | ICD-10-CM

## 2013-04-02 DIAGNOSIS — I10 Essential (primary) hypertension: Secondary | ICD-10-CM

## 2013-04-05 NOTE — Telephone Encounter (Signed)
I called and spoke to husband and I will call pt back around 1500-1530 today (regarding lab tests).

## 2013-04-11 NOTE — Telephone Encounter (Signed)
I called pt and spoke to her re: [place] lab evaluation.  She is wondering whether this is really for her.  She was seen for headaches.  Unfortunately Dr. Pearlean Brownie out of office, and will touch base with him when he returns on Wednesday.

## 2013-04-13 NOTE — Sleep Study (Signed)
See media tab for full report  

## 2013-04-15 ENCOUNTER — Telehealth: Payer: Self-pay | Admitting: Neurology

## 2013-04-15 NOTE — Telephone Encounter (Signed)
I spoke to Dr. Pearlean Brownie on his return.  Labs may not be for this particular pt.  Will address when come in for appt in December.

## 2013-04-15 NOTE — Telephone Encounter (Signed)
Please call and notify the patient that the recent sleep study did not show any significant obstructive sleep apnea. Please inform patient that she can follow with Dr. Pearlean Brownie and we will send her a copy of her sleep study and that he can go over the details of the study during a follow up appointment. Also, route or fax report to PCP and referring MD, if other than PCP.  Once you have spoken to patient, you can close this encounter.   Thanks,  Huston Foley, MD, PhD [facility])

## 2013-04-18 ENCOUNTER — Encounter: Payer: Self-pay | Admitting: *Deleted

## 2013-04-18 NOTE — Telephone Encounter (Signed)
I called and spoke with the patient about her sleep study results. I informed the patient that the recent study did not show any significant obstructive sleep apnea and that Dr. Frances Furbish would like for her to follow up with Dr. Pearlean Brownie. Patient understood and stated she will see Dr. Pearlean Brownie on 04-25-13. I will mail a copy of the report to the patient and fax Dr. Pearlean Brownie and Dr. Rudi Heap a copy of the report.

## 2013-04-25 ENCOUNTER — Encounter: Payer: Self-pay | Admitting: Neurology

## 2013-04-25 ENCOUNTER — Ambulatory Visit (INDEPENDENT_AMBULATORY_CARE_PROVIDER_SITE_OTHER): Payer: Medicare Other | Admitting: Neurology

## 2013-04-25 ENCOUNTER — Encounter (INDEPENDENT_AMBULATORY_CARE_PROVIDER_SITE_OTHER): Payer: Self-pay

## 2013-04-25 VITALS — BP 159/71 | HR 74 | Ht 62.0 in | Wt 118.0 lb

## 2013-04-25 DIAGNOSIS — E1149 Type 2 diabetes mellitus with other diabetic neurological complication: Secondary | ICD-10-CM | POA: Diagnosis not present

## 2013-04-25 DIAGNOSIS — I6789 Other cerebrovascular disease: Secondary | ICD-10-CM | POA: Diagnosis not present

## 2013-04-25 DIAGNOSIS — R9409 Abnormal results of other function studies of central nervous system: Secondary | ICD-10-CM

## 2013-04-25 NOTE — Progress Notes (Addendum)
[facility] [address] [place]. [place] [address] [phone]       OFFICE FOLLOW UP  NOTE  Deborah Lowe Date of Birth:  1941/03/13 Medical Record Number:  [id number]   Referring MD:  Dr Deborah Lowe  Reason for Referral:  Headache  HPI:  58 year lady with headaches off and on x 1 year which occur 2-3 times per week lasting several hours.Headaches are pressure like in quality moderate 8/10 severity.occasional accompanying nausea but no light or sound sensitivity with no identified triggers.She has tried tylenol without relief but no other medicines.She was seen by Dr Deborah Lowe recently for evaluation for sinus headaches but found to have to significant sinus disease contributing to her headaches.She has not had any brain imaging studies yet.She denies any accompanying vision symptoms, diplopia, focal weakness, gait or balance problems. She complains of excessive day time sleepiness, fatigue, snoring, disturbed night sleep but has not been evaluated for sleep apnoea.Shehas no h/o migraines but daughter has migraines Update 04/25/13 : She returns for f/u after initial consult 03/07/2013.She reports improvement in her headaches and good response to 2 tablets of extra strength tylenol.she did not start depakote which I had prescribed. She had sleep study 04/02/13 which showed only mild sleep apnoea for which she does not need CPAP. MRI brain 03/16/13 showed nonspecific right midtemporal subcortical white matter hyperintensities.There was a incidental 3.6 x 3.8 cm arachnoid cyst in posterior fossa.She has no new complaints today.  ROS:   14 system review of systems is positive for fatigue,palpititions,ringing in ears,double vision,feeling cold,headache, dizziness, sleepiness,decreased energy,snoring  PMH:  Past Medical History  Diagnosis Date  . Hypertension   . Wears glasses   . Ringing in ears   . Palpitations     on beta blocker therapy  . Breast cancer 2012    s/p  lumpectomy of left breast with XRT    Social History:  History   Social History  . Marital Status: Married    Spouse Name: terry    Number of Children: 1  . Years of Education: 12   Occupational History  . retired    Social History Main Topics  . Smoking status: Never Smoker   . Smokeless tobacco: Never Used  . Alcohol Use: No  . Drug Use: No  . Sexual Activity: Yes    Birth Control/ Protection: Post-menopausal   Other Topics Concern  . Not on file   Social History Narrative  . No narrative on file    Medications:   Current Outpatient Prescriptions on File Prior to Visit  Medication Sig Dispense Refill  . anastrozole (ARIMIDEX) 1 MG tablet TAKE 1 TABLET (1 MG TOTAL) BY MOUTH DAILY.  30 tablet  11  . losartan-hydrochlorothiazide (HYZAAR) 100-12.5 MG per tablet TAKE 1 TABLET BY MOUTH DAILY.  30 tablet  0  . metoprolol succinate (TOPROL-XL) 25 MG 24 hr tablet Take 1 tablet (25 mg total) by mouth daily.  90 tablet  3   No current facility-administered medications on file prior to visit.    Allergies:   Allergies  Allergen Reactions  . Avelox [Moxifloxacin Hcl In Nacl] Anaphylaxis    Physical Exam General: well developed, well nourished, seated, in no evident distress Head: head normocephalic and atraumatic. Orohparynx benign Neck: supple with no carotid or supraclavicular bruits Cardiovascular: regular rate and rhythm, no murmurs Musculoskeletal: no deformity Skin:  no rash/petichiae Vascular:  Normal pulses all extremities Filed Vitals:   04/25/13 1408  BP: 159/71  Pulse: 74    Neurologic Exam Mental Status: Awake and fully alert. Oriented to place and time. Recent and remote memory intact. Attention span, concentration and fund of knowledge appropriate. Mood and affect appropriate.  Cranial Nerves: Fundoscopic exam reveals sharp disc margins. Pupils equal, briskly reactive to light. Extraocular movements full without nystagmus. Visual fields full to  confrontation. Hearing intact. Facial sensation intact. Face, tongue, palate moves normally and symmetrically.  Motor: Normal bulk and tone. Normal strength in all tested extremity muscles. Sensory.: intact to tough and pinprick and vibratory.  Coordination: Rapid alternating movements normal in all extremities. Finger-to-nose and heel-to-shin performed accurately bilaterally. Gait and Station: Arises from chair without difficulty. Stance is normal. Gait demonstrates normal stride length and balance . Able to heel, toe and tandem walk without difficulty.  Reflexes: 1+ and symmetric. Toes downgoing.     ASSESSMENT: 58 year lady with chronic headaches likely tension headaches.Normal neurological exam. Given h/o cancer need to evaluate for structural brain lesions and given h/o snoring and sleepiness evaluate for sleep apnoea    PLAN:  Continue Tylenol extra strength as needed for headaches and maintain a strict headache diary. I encouraged the patient to participate in increased activities for stress relaxation like regular exercise, swimming, meditation and yoga. Check fasting lipid profile, hemoglobin A1c, Lyme titer, ESR, ANA, anticardiolipin antibodies for her abnormal MRI brain findings. Return for followup in 2 months with Deborah Fife Lam,NP or call earlier if necessary.

## 2013-04-25 NOTE — Patient Instructions (Signed)
Continue Tylenol extra strength as needed for headaches and maintain a strict headache diary. I encouraged the patient to participate in increased activities for stress relaxation like regular exercise, swimming, meditation and yoga. Check fasting lipid profile, hemoglobin A1c, Lyme titer, ESR, ANA, anticardiolipin antibodies for her abnormal MRI brain findings. Return for followup in 2 months with Deborah Fife Lam,NP or call earlier if necessary.

## 2013-04-28 ENCOUNTER — Other Ambulatory Visit: Payer: Self-pay | Admitting: *Deleted

## 2013-04-28 MED ORDER — LOSARTAN POTASSIUM-HCTZ 100-12.5 MG PO TABS: ORAL_TABLET | ORAL | Status: DC

## 2013-04-28 MED ORDER — METOPROLOL SUCCINATE ER 25 MG PO TB24: 25.0000 mg | ORAL_TABLET | Freq: Every day | ORAL | Status: DC

## 2013-05-26 ENCOUNTER — Other Ambulatory Visit: Payer: Self-pay | Admitting: Oncology

## 2013-05-30 ENCOUNTER — Other Ambulatory Visit (INDEPENDENT_AMBULATORY_CARE_PROVIDER_SITE_OTHER): Payer: Self-pay

## 2013-05-30 DIAGNOSIS — I6789 Other cerebrovascular disease: Secondary | ICD-10-CM | POA: Diagnosis not present

## 2013-05-30 DIAGNOSIS — R9409 Abnormal results of other function studies of central nervous system: Secondary | ICD-10-CM | POA: Diagnosis not present

## 2013-05-30 DIAGNOSIS — Z0289 Encounter for other administrative examinations: Secondary | ICD-10-CM

## 2013-05-30 DIAGNOSIS — E1149 Type 2 diabetes mellitus with other diabetic neurological complication: Secondary | ICD-10-CM | POA: Diagnosis not present

## 2013-05-30 DIAGNOSIS — Z9189 Other specified personal risk factors, not elsewhere classified: Secondary | ICD-10-CM | POA: Diagnosis not present

## 2013-05-31 LAB — CARDIOLIPIN ANTIBODY
Anticardiolipin IgA: <9 APL U/mL (ref 0–11)
Anticardiolipin IgG: <9 GPL U/mL (ref 0–14)
Anticardiolipin IgM: <9 MPL U/mL (ref 0–12)

## 2013-05-31 LAB — LIPID PANEL
Chol/HDL Ratio: 3.1 ratio units (ref 0.0–4.4)
Cholesterol, Total: 199 mg/dL (ref 100–199)
HDL: 64 mg/dL (ref >39)
LDL Calculated: 117 mg/dL — ABNORMAL HIGH (ref 0–99)
Triglycerides: 91 mg/dL (ref 0–149)
VLDL Cholesterol Cal: 18 mg/dL (ref 5–40)

## 2013-05-31 LAB — SEDIMENTATION RATE: Sed Rate: 2 mm/hr (ref 0–40)

## 2013-05-31 LAB — HEMOGLOBIN A1C
Est. average glucose Bld gHb Est-mCnc: 114 mg/dL
Hgb A1c MFr Bld: 5.6 % (ref 4.8–5.6)

## 2013-05-31 LAB — ANA: Anti Nuclear Antibody(ANA): NEGATIVE

## 2013-06-05 DIAGNOSIS — J209 Acute bronchitis, unspecified: Secondary | ICD-10-CM | POA: Diagnosis not present

## 2013-06-15 ENCOUNTER — Ambulatory Visit: Payer: Medicare Other | Admitting: Family Medicine

## 2013-07-07 ENCOUNTER — Telehealth: Payer: Self-pay | Admitting: Oncology

## 2013-07-07 ENCOUNTER — Other Ambulatory Visit (HOSPITAL_BASED_OUTPATIENT_CLINIC_OR_DEPARTMENT_OTHER): Payer: Medicare Other

## 2013-07-07 ENCOUNTER — Encounter: Payer: Self-pay | Admitting: Oncology

## 2013-07-07 ENCOUNTER — Ambulatory Visit (HOSPITAL_BASED_OUTPATIENT_CLINIC_OR_DEPARTMENT_OTHER): Payer: Medicare Other | Admitting: Oncology

## 2013-07-07 VITALS — BP 156/77 | HR 58 | Temp 97.5°F | Resp 18 | Ht 62.0 in | Wt 116.8 lb

## 2013-07-07 DIAGNOSIS — I10 Essential (primary) hypertension: Secondary | ICD-10-CM | POA: Diagnosis not present

## 2013-07-07 DIAGNOSIS — E559 Vitamin D deficiency, unspecified: Secondary | ICD-10-CM

## 2013-07-07 DIAGNOSIS — C50419 Malignant neoplasm of upper-outer quadrant of unspecified female breast: Secondary | ICD-10-CM

## 2013-07-07 LAB — CBC WITH DIFFERENTIAL/PLATELET
BASO%: 0.7 % (ref 0.0–2.0)
BASOS ABS: 0 10*3/uL (ref 0.0–0.1)
EOS%: 1.3 % (ref 0.0–7.0)
Eosinophils Absolute: 0.1 10*3/uL (ref 0.0–0.5)
HCT: 40.5 % (ref 34.8–46.6)
HGB: 13.7 g/dL (ref 11.6–15.9)
LYMPH%: 29.6 % (ref 14.0–49.7)
MCH: 29.7 pg (ref 25.1–34.0)
MCHC: 33.8 g/dL (ref 31.5–36.0)
MCV: 87.8 fL (ref 79.5–101.0)
MONO#: 0.5 10*3/uL (ref 0.1–0.9)
MONO%: 8.6 % (ref 0.0–14.0)
NEUT#: 3.7 10*3/uL (ref 1.5–6.5)
NEUT%: 59.8 % (ref 38.4–76.8)
Platelets: 220 10*3/uL (ref 145–400)
RBC: 4.61 10*6/uL (ref 3.70–5.45)
RDW: 13.6 % (ref 11.2–14.5)
WBC: 6.2 10*3/uL (ref 3.9–10.3)
lymph#: 1.8 10*3/uL (ref 0.9–3.3)

## 2013-07-07 LAB — COMPREHENSIVE METABOLIC PANEL (CC13)
ALBUMIN: 4 g/dL (ref 3.5–5.0)
ALT: 13 U/L (ref 0–55)
AST: 13 U/L (ref 5–34)
Alkaline Phosphatase: 73 U/L (ref 40–150)
Anion Gap: 8 mEq/L (ref 3–11)
BUN: 11.4 mg/dL (ref 7.0–26.0)
CO2: 28 mEq/L (ref 22–29)
Calcium: 9.7 mg/dL (ref 8.4–10.4)
Chloride: 97 mEq/L — ABNORMAL LOW (ref 98–109)
Creatinine: 0.7 mg/dL (ref 0.6–1.1)
Glucose: 93 mg/dl (ref 70–140)
Potassium: 3.6 mEq/L (ref 3.5–5.1)
SODIUM: 133 meq/L — AB (ref 136–145)
Total Bilirubin: 0.66 mg/dL (ref 0.20–1.20)
Total Protein: 6.8 g/dL (ref 6.4–8.3)

## 2013-07-07 NOTE — Progress Notes (Signed)
OFFICE PROGRESS NOTE  CC Dr. Redge Gainer or Dr. Fanny Skates Dr. Arloa Koh  DIAGNOSIS: 73 year old female with stage I invasive ductal carcinoma of the left breast status post left lumpectomy on 12/25/2010.  PRIOR THERAPY:  #1 patient was found to have on a screening mammogram in July 2012 an abnormality in the left breast. She went on to have a core needle biopsy that showed an invasive ductal carcinoma with associated high-grade ductal carcinoma in situ. The tumor was estrogen receptor +80% progesterone receptor +10% proliferation marker 16%.  #2 patient underwent a left breast lumpectomy with a sentinel node procedure on 12/25/2010. The final pathology revealed a T1 C. N0) stage I invasive ductal carcinoma measuring less than 0.1 cm with associated high-grade ductal carcinoma in situ. ER positive PR positive with a proliferation marker of 16%.  #3 Patient is now status post radiation therapy to the left breast by Dr. Adela Lank.  #4 patient on adjuvant Arimidex 1 mg daily. A total of 5 years of therapy is planned.  CURRENT THERAPY: Arimidex 1 mg daily starting 04/26/2011.   INTERVAL HISTORY: Deborah Lowe 73 y.o. female returns for followup visit today. She is doing very well. She is without any complaints related to her medications. She has no nausea or vomiting, no hot flashes, no pain anywhere. Remainder of the 10 point review of systems is negative.  MEDICAL HISTORY: Past Medical History  Diagnosis Date  . Hypertension   . Wears glasses   . Ringing in ears   . Palpitations     on beta blocker therapy  . Breast cancer 2012    s/p lumpectomy of left breast with XRT    ALLERGIES:  is allergic to avelox.  MEDICATIONS:  Current Outpatient Prescriptions  Medication Sig Dispense Refill  . anastrozole (ARIMIDEX) 1 MG tablet TAKE 1 TABLET (1 MG TOTAL) BY MOUTH DAILY.  30 tablet  11  . losartan-hydrochlorothiazide (HYZAAR) 100-12.5 MG per tablet TAKE 1 TABLET BY MOUTH  DAILY.  30 tablet  6  . metoprolol succinate (TOPROL-XL) 25 MG 24 hr tablet Take 1 tablet (25 mg total) by mouth daily.  30 tablet  6   No current facility-administered medications for this visit.    SURGICAL HISTORY:  Past Surgical History  Procedure Laterality Date  . Cholecystectomy    . Tear duct probing    . Cardiac catheterization  01/29/2007  . Cardiovascular stress test  07/19/2002    EF 84%    REVIEW OF SYSTEMS:  A comprehensive review of systems was negative.   PHYSICAL EXAMINATION: General appearance: alert, cooperative and appears stated age Neck: no adenopathy, no carotid bruit, no JVD, supple, symmetrical, trachea midline and thyroid not enlarged, symmetric, no tenderness/mass/nodules Lymph nodes: Cervical, supraclavicular, and axillary nodes normal. Resp: clear to auscultation bilaterally and normal percussion bilaterally Back: symmetric, no curvature. ROM normal. No CVA tenderness. Cardio: regular rate and rhythm, S1, S2 normal, no murmur, click, rub or gallop and normal apical impulse GI: soft, non-tender; bowel sounds normal; no masses,  no organomegaly Extremities: extremities normal, atraumatic, no cyanosis or edema Neurologic: Alert and oriented X 3, normal strength and tone. Normal symmetric reflexes. Normal coordination and gait Bilateral breast examination is performed. Left breast reveals well-healed surgical scar there is darkening of his skin secondary to radiation therapy there is no nipple retraction or discharge. There is some thickening of skin under the surgical scar. Right breast no masses or nipple discharge or or retraction or inversion ECOG  PERFORMANCE STATUS: 0 - Asymptomatic  Blood pressure 156/77, pulse 58, temperature 97.5 F (36.4 C), temperature source Oral, resp. rate 18, height 5\' 2"  (1.575 m), weight 116 lb 12.8 oz (52.98 kg).  LABORATORY DATA: Lab Results  Component Value Date   WBC 6.2 07/07/2013   HGB 13.7 07/07/2013   HCT 40.5  07/07/2013   MCV 87.8 07/07/2013   PLT 220 07/07/2013      Chemistry      Component Value Date/Time   NA 136 01/03/2013 1205   NA 136 12/07/2012 1029   K 3.7 01/03/2013 1205   K 3.5 12/07/2012 1029   CL 98 12/07/2012 1029   CL 97* 08/04/2012 1010   CO2 28 01/03/2013 1205   CO2 31 12/07/2012 1029   BUN 10.7 01/03/2013 1205   BUN 12 12/07/2012 1029   CREATININE 0.7 01/03/2013 1205   CREATININE 0.6 12/07/2012 1029      Component Value Date/Time   CALCIUM 9.6 01/03/2013 1205   CALCIUM 9.7 12/07/2012 1029   ALKPHOS 67 01/03/2013 1205   ALKPHOS 69 12/07/2012 1029   AST 13 01/03/2013 1205   AST 13 12/07/2012 1029   ALT 14 01/03/2013 1205   ALT 19 12/07/2012 1029   BILITOT 0.82 01/03/2013 1205   BILITOT 1.0 12/07/2012 1029       RADIOGRAPHIC STUDIES:  No results found.  ASSESSMENT/PLAN: 73 year old female with  #1 stage I invasive ductal carcinoma of the left breast status post left lumpectomy on 12/25/2010. The final pathology revealed a 0.1 cm invasive ductal carcinoma with associated high-grade ductal carcinoma in situ ER positive PR positive with a proliferation marker of 16%. Patient is now status post radiation therapy to the breast.  #2. continue  adjuvant Arimidex 1 mg daily for 5 years.Overall she is tolerating it very well. She is without any significant side effects from it.   #3 hypertension: Patient is being followed by her primary care physician. She tells me today she did not take her antihypertensives.   #4 patient will be seen back in 6 months time for followup with me. However she knows to call me sooner if any concerns arise.   All questions were answered. The patient knows to call the clinic with any problems, questions or concerns. We can certainly see the patient much sooner if necessary.  I spent 25 minutes counseling the patient face to face. The total time spent in the appointment was 40 minutes.    Marcy Panning, MD Medical/Oncology [facility] [phone] (beeper) [phone] (Office)  07/07/2013, 10:55 AM

## 2013-07-07 NOTE — Telephone Encounter (Signed)
, °

## 2013-07-07 NOTE — Patient Instructions (Signed)
Breast Cancer Survivor Follow-Up Breast cancer begins when cells in the breast divide too rapidly. The extra cells form a lump (tumor). When the cancer is treated, the goal is to get rid of all cancer cells. However, sometimes a few cells survive. These cancer cells can then grow. They become recurrent cancer. This means the cancer comes back after treatment.  Most cases of recurrent breast cancer develop 3 to 5 years after treatment. However, sometimes it comes back just a few months after treatment. Other times, it does not come back until years later. If the cancer comes back in the same area as the first breast cancer, it is called a local recurrence. If the cancer comes back somewhere else in the body, it is called regional recurrence if the site is fairly near the breast or distant recurrence if it is far from the breast. Your caregiver may also use the term metastasize to indicate a cancer that has gone to another part of your body. Treatment is still possible after either kind of recurrence. The cancer can still be controlled.  CAUSES OF RECURRENT CANCER No one knows exactly why breast cancer starts in the first place. Why the cancer comes back after treatment is also not clear. It is known that certain conditions, called risk factors, can make this more likely. They include:  Developing breast cancer for the first time before age 60.  Having breast cancer that involves the lymph nodes. These are small, round pieces of tissue found all over the body. Their job is to help fight infections.  Having a large tumor. Cancer is more apt to come back if the first tumor was bigger than 2 inches (5 cm).  Having certain types of breast cancer, such as:  Inflammatory breast cancer. This rare type grows rapidly and causes the breast to become red and swollen.  A high-grade tumor. The grade of a tumor indicates how fast it will grow and spread. High-grade tumors grow more quickly than other types.  HER2  cancer. This refers to the tumor's genetic makeup. Tumors that have this type of gene are more likely to come back after treatment.  Having close tumor margins. This refers to the space between the tumor and normal, noncancerous cells. If the space is small, the tumor has a greater chance of coming back.  Having treatment involving a surgery to remove the tumor but not the entire breast (lumpectomy) and no radiation therapy. CARE AFTER BREAST CANCER Home Monitoring Women who have had breast cancer should continue to examine their breasts every month. The goal is to catch the cancer quickly if it comes back. Many women find it helpful to do so on the same day each month and to mark the calendar as a reminder. Let your caregiver know immediately if you have any signs of recurrent breast cancer. Symptoms will vary, depending on where the cancer recurs. The original type of treatment can also make a difference. Symptoms of local recurrence after a lumpectomy or a recurrence in the opposite breast may include:  A new lump or thickening in the breast.  A change in the way the skin looks on the breast (such as a rash, dimpling, or wrinkling).  Redness or swelling of the breast.  Changes in the nipple (such as being red, puckered, swollen, or leaking fluid). Symptoms of a recurrence after a breast removal surgery (mastectomy) may include:  A lump or thickening under the skin.  A thickening around the mastectomy scar. Symptoms   of regional recurrence in the lymph nodes near the breast may include:  A lump under the arm or above the collarbone.  Swelling of the arm.  Pain in the arm, shoulder, or chest.  Numbness in the hand or arm. Symptoms of distant recurrence may include:  A cough that does not go away.  Trouble breathing or shortness of breath.  Pain in the bones or the chest. This is pain that lasts or does not respond to rest and medicine.  Headaches.  Sudden vision  problems.  Dizziness.  Nausea or vomiting.  Losing weight without trying to.  Persistent abdominal pain.  Changes in bowel movements or blood in the stool.  Yellowing of the skin or eyes (jaundice).  Blood in the urine or bloody vaginal discharge. Clinical Monitoring  It is helpful to keep a schedule of appointments for needed tests and exams. This includes physical exams, breast exams, exams of the lymph nodes, and general exams.  For the first 3 years after being treated for breast cancer, see your caregiver every 3 to 6 months.  For years 4 and 5 after breast cancer, see your caregiver every 6 to 12 months.  After 5 years, see your caregiver at least once a year.  Regular breast X-rays (mammograms) should continue even if you had a mastectomy.  A mammogram should be done 1 year after the mammogram that first detected breast cancer.  A mammogram should be done every 6 to 12 months after that. Follow your caregiver's advice.  A pelvic exam done by your caregiver checks whether female organs are the normal size and shape. The exam is usually done every year. Ask your caregiver if that schedule is right for you.  Women taking tamoxifen should report any vaginal bleeding immediately to their caregiver. Tamoxifen is often given to women with a certain type of breast cancer. It has been shown to help prevent recurrence.  You will need to decide who your primary caregiver will be.  Most people continue to see their cancer specialist (oncologist) every 3 to 6 months for the first year after cancer treatment.  At some point, you may want to go back to seeing your family caregiver. You would no longer see your oncologist for regular checkups. Many women do this about 1 year after their first diagnosis of breast cancer.  You will still need to be seen every so often by your oncologist. Ask how often that should be. Coordinate this with your family or primary caregiver.  Think about  having genetic counseling. This would provide information on traits that can be passed or inherited from one generation to the next. In some cases, breast cancer runs in families. Tell your caregiver if you:  Are of Ashkenazi Jewish heritage.  Have any family member who has had ovarian cancer.  Have a mother, sister, or daughter who had breast cancer before age 7.  Have 2 or more close relatives who have had breast cancer. This means a mother, sister, daughter, aunt, or grandmother.  Had breast cancer in both breasts.  Have a female relative who has had breast cancer.  Some tests are not recommended for routine screening. Someone recovering from breast cancer does not need to have these tests if there are no problems. The tests have risks, such as radiation exposure, and can be costly. The risks of these tests are thought to be greater than the benefits:  Blood tests.  Chest X-rays.  Bone scans.  Liver ultrasound.  Computed tomography (CT scan).  Positron emission tomography (PET scan).  Magnetic resonance imaging (MRI scan). DIAGNOSIS OF RECURRENT CANCER Recurrent breast cancer may be suspected for various reasons. A mammogram may not look normal. You might feel a lump or have other symptoms. Your caregiver may find something unusual during an exam. To be sure, your caregiver will probably order some tests. The tests are needed because there are symptoms or hints of a problem. They could include:  Blood tests, including a test to check how well the liver is working. The liver is a common site for a distant cancer recurrence.  Imaging tests that create pictures of the inside of the body. These tests include:  Chest X-rays to show if the cancer has come back in the lungs.  CT scans to create detailed pictures of various areas of the body and help find a distant recurrence.  MRI scans to find anything unusual in the breast, chest, or lymph nodes.  Breast ultrasound tests to  examine the breasts.  Bone scans to create a picture of your whole skeleton and find cancer in bony areas.  PET scans to create an image of the whole body. PET scans can be used together with CT scans to show more detail.  Biopsy. A small sample of tissue is taken and checked under a microscope. If cancer cells are found, they may be tested to see if they contain the HER2 gene or the hormones estrogen and progesterone. This will help your caregiver decide how to treat the recurrent cancer. TREATMENT  How recurrent breast cancer is treated depends on where the new cancer is found. The type of treatment that was used for the first breast cancer makes a difference, too. A combination of treatments may be used. Options include:  Surgery.  If the cancer comes back in the breast that was not treated before, you may need a lumpectomy or mastectomy.  If the cancer comes back in the breast that was treated before, you may need a mastectomy.  The lymph nodes under the arm may need to be removed.  Radiation therapy.  For a local recurrence, radiation may be used if it was not used during the first treatment.  For a distance recurrence, radiation is sometimes used.  Chemotherapy.  This may be used before surgery to treat recurrent breast cancer.  This may be used to treat recurrent cancer that cannot be treated with surgery.  This may be used to treat a distant recurrence.  Hormone therapy.  Women with the HER2 gene may be given hormone therapy to attack this gene. Document Released: 01/08/2011 Document Revised: 08/04/2011 Document Reviewed: 01/08/2011 [employer] Patient Information 2014 [employer].  

## 2013-07-08 ENCOUNTER — Encounter: Payer: Self-pay | Admitting: Nurse Practitioner

## 2013-07-08 ENCOUNTER — Ambulatory Visit (INDEPENDENT_AMBULATORY_CARE_PROVIDER_SITE_OTHER): Payer: Medicare Other | Admitting: Nurse Practitioner

## 2013-07-08 VITALS — BP 143/63 | HR 64 | Ht 61.0 in | Wt 117.0 lb

## 2013-07-08 DIAGNOSIS — R51 Headache: Secondary | ICD-10-CM

## 2013-07-08 NOTE — Progress Notes (Signed)
PATIENT: Deborah Lowe DOB: 10/12/1940   REASON FOR VISIT: follow up for headaches, labs HISTORY FROM: patient  HISTORY OF PRESENT ILLNESS: 67 year lady with headaches off and on x 1 year which occur 2-3 times per week lasting several hours.Headaches are pressure like in quality moderate 8/10 severity.occasional accompanying nausea but no light or sound sensitivity with no identified triggers.She has tried tylenol without relief but no other medicines.She was seen by Dr Constance Holster recently for evaluation for sinus headaches but found to have to significant sinus disease contributing to her headaches.She has not had any brain imaging studies yet.She denies any accompanying vision symptoms, diplopia, focal weakness, gait or balance problems. She complains of excessive day time sleepiness, fatigue, snoring, disturbed night sleep but has not been evaluated for sleep apnoea.Shehas no h/o migraines but daughter has migraines   Update 04/25/13 (PS):  She returns for f/u after initial consult 03/07/2013.She reports improvement in her headaches and good response to 2 tablets of extra strength tylenol.  She did not start depakote which I had prescribed. She had sleep study 04/02/13 which showed only mild sleep apnoea for which she does not need CPAP. MRI brain 03/16/13 showed nonspecific right midtemporal subcortical white matter hyperintensities.There was a incidental 3.6 x 3.8 cm arachnoid cyst in posterior fossa.  She has no new complaints today.   UPDATE 07/08/13 (LL):  She returns for follow up for headaches and labwork today.  Fasting lipid profile, hemoglobin A1c, ESR, ANA, and anticardiolipin antibodies were checked for her abnormal MRI brain findings, all are within normal limits.  She reports very infrequent headaches now, has not even needed to take any Tylenol in the past 2 months.  She has no new complaints today.   ROS:  14 system review of systems is positive for ringing in ears, feeling cold,  palpitations  ALLERGIES: Allergies  Allergen Reactions  . Avelox [Moxifloxacin Hcl In Nacl] Anaphylaxis    HOME MEDICATIONS: Outpatient Prescriptions Prior to Visit  Medication Sig Dispense Refill  . anastrozole (ARIMIDEX) 1 MG tablet TAKE 1 TABLET (1 MG TOTAL) BY MOUTH DAILY.  30 tablet  11  . losartan-hydrochlorothiazide (HYZAAR) 100-12.5 MG per tablet TAKE 1 TABLET BY MOUTH DAILY.  30 tablet  6  . metoprolol succinate (TOPROL-XL) 25 MG 24 hr tablet Take 1 tablet (25 mg total) by mouth daily.  30 tablet  6   No facility-administered medications prior to visit.    PAST MEDICAL HISTORY: Past Medical History  Diagnosis Date  . Hypertension   . Wears glasses   . Ringing in ears   . Palpitations     on beta blocker therapy  . Breast cancer 2012    s/p lumpectomy of left breast with XRT    PAST SURGICAL HISTORY: Past Surgical History  Procedure Laterality Date  . Cholecystectomy    . Tear duct probing    . Cardiac catheterization  01/29/2007  . Cardiovascular stress test  07/19/2002    EF 84%    FAMILY HISTORY: Family History  Problem Relation Age of Onset  . Cancer Mother     colon  . Heart disease Father     SOCIAL HISTORY: History   Social History  . Marital Status: Married    Spouse Name: terry    Number of Children: 1  . Years of Education: 12   Occupational History  . retired    Social History Main Topics  . Smoking status: Never Smoker   .  Smokeless tobacco: Never Used  . Alcohol Use: No  . Drug Use: No  . Sexual Activity: Yes    Birth Control/ Protection: Post-menopausal   Other Topics Concern  . Not on file   Social History Narrative  . No narrative on file     PHYSICAL EXAM  Filed Vitals:   07/08/13 1317  BP: 143/63  Pulse: 64  Height: _0  (1.549 m)  Weight: 117 lb (53.071 kg)   Body mass index is 22.12 kg/(m^2).  Physical Exam  General: well developed, well nourished, seated, in no evident distress  Head: head  normocephalic and atraumatic. Orohparynx benign  Neck: supple with no carotid or supraclavicular bruits  Cardiovascular: regular rate and rhythm, no murmurs  Musculoskeletal: no deformity  Skin: no rash/petichiae  Vascular: Normal pulses all extremities   Neurologic Exam  Mental Status: Awake and fully alert. Oriented to place and time. Recent and remote memory intact. Attention span, concentration and fund of knowledge appropriate. Mood and affect appropriate.  Cranial Nerves:  Pupils equal, briskly reactive to light. Extraocular movements full without nystagmus. Visual fields full to confrontation. Hearing intact. Facial sensation intact. Face, tongue, palate moves normally and symmetrically.  Motor: Normal bulk and tone. Normal strength in all tested extremity muscles.  Sensory: intact to touch and pinprick and vibratory.  Coordination: Rapid alternating movements normal in all extremities. Finger-to-nose and heel-to-shin performed accurately bilaterally.  Gait and Station: Arises from chair without difficulty. Stance is normal. Gait demonstrates normal stride length and balance . Able to heel, toe and tandem walk without difficulty.  Reflexes: 1+ and symmetric. Toes downgoing.   DIAGNOSTIC DATA (LABS, IMAGING, TESTING) - I reviewed patient records, labs, notes, testing and imaging myself where available.  Lab Results  Component Value Date   WBC 6.2 07/07/2013   HGB 13.7 07/07/2013   HCT 40.5 07/07/2013   MCV 87.8 07/07/2013   PLT 220 07/07/2013    Lab Results  Component Value Date   HDL 64 05/30/2013   LDLCALC 117* 05/30/2013   TRIG 91 05/30/2013   CHOLHDL 3.1 05/30/2013   Lab Results  Component Value Date   HGBA1C 5.6 05/30/2013   Lab Results  Component Value Date   TSH 2.460 12/23/2012   Lab Results  Component Value Date   ESRSEDRATE 2 05/30/2013   ESR, ANA, anticardiolipin antibodies were normal.  ASSESSMENT AND PLAN 26 year lady with chronic headaches likely tension headaches  which are much improved; very infrequent now.  Normal neurological exam. Fasting lipid profile, hemoglobin A1c, ESR, ANA, and anticardiolipin antibodies were checked for her abnormal MRI brain findings, all are within normal limits.   PLAN:  Continue Tylenol extra strength as needed for headaches and maintain a strict headache diary. I encouraged the patient to participate in increased activities for stress relaxation like regular exercise, swimming, meditation and yoga.  Return for followup as needed.  Philmore Pali, MSN, NP-C 07/08/2013, 1:27 PM [facility] [address], [place], [place] [address] [phone]  Note: This document was prepared with digital dictation and possible smart phrase technology. Any transcriptional errors that result from this process are unintentional.

## 2013-07-08 NOTE — Patient Instructions (Signed)
PLAN:  Continue Tylenol extra strength as needed for headaches and maintain a strict headache diary.  I encouraged the patient to participate in increased activities for stress relaxation like regular exercise, swimming, meditation and yoga.  Return for followup as needed.

## 2013-10-11 DIAGNOSIS — H04419 Chronic dacryocystitis of unspecified lacrimal passage: Secondary | ICD-10-CM | POA: Diagnosis not present

## 2013-10-11 DIAGNOSIS — H04229 Epiphora due to insufficient drainage, unspecified lacrimal gland: Secondary | ICD-10-CM | POA: Diagnosis not present

## 2013-10-11 DIAGNOSIS — Z961 Presence of intraocular lens: Secondary | ICD-10-CM | POA: Diagnosis not present

## 2013-10-11 DIAGNOSIS — H10029 Other mucopurulent conjunctivitis, unspecified eye: Secondary | ICD-10-CM | POA: Diagnosis not present

## 2013-10-13 DIAGNOSIS — H04569 Stenosis of unspecified lacrimal punctum: Secondary | ICD-10-CM | POA: Insufficient documentation

## 2013-10-13 DIAGNOSIS — H04549 Stenosis of unspecified lacrimal canaliculi: Secondary | ICD-10-CM | POA: Diagnosis not present

## 2013-10-13 DIAGNOSIS — H04229 Epiphora due to insufficient drainage, unspecified lacrimal gland: Secondary | ICD-10-CM | POA: Diagnosis not present

## 2013-10-13 DIAGNOSIS — H538 Other visual disturbances: Secondary | ICD-10-CM | POA: Diagnosis not present

## 2013-10-13 DIAGNOSIS — IMO0001 Reserved for inherently not codable concepts without codable children: Secondary | ICD-10-CM | POA: Insufficient documentation

## 2013-10-13 DIAGNOSIS — H04209 Unspecified epiphora, unspecified lacrimal gland: Secondary | ICD-10-CM | POA: Diagnosis not present

## 2013-11-07 DIAGNOSIS — H10029 Other mucopurulent conjunctivitis, unspecified eye: Secondary | ICD-10-CM | POA: Diagnosis not present

## 2013-11-07 DIAGNOSIS — H04419 Chronic dacryocystitis of unspecified lacrimal passage: Secondary | ICD-10-CM | POA: Diagnosis not present

## 2013-11-14 DIAGNOSIS — H04419 Chronic dacryocystitis of unspecified lacrimal passage: Secondary | ICD-10-CM | POA: Diagnosis not present

## 2013-11-14 DIAGNOSIS — H10029 Other mucopurulent conjunctivitis, unspecified eye: Secondary | ICD-10-CM | POA: Diagnosis not present

## 2013-11-16 ENCOUNTER — Telehealth: Payer: Self-pay | Admitting: Family Medicine

## 2013-11-16 DIAGNOSIS — J019 Acute sinusitis, unspecified: Secondary | ICD-10-CM | POA: Diagnosis not present

## 2013-11-16 NOTE — Telephone Encounter (Signed)
Patient changed her mind

## 2013-12-09 ENCOUNTER — Other Ambulatory Visit: Payer: Self-pay

## 2013-12-09 ENCOUNTER — Ambulatory Visit (INDEPENDENT_AMBULATORY_CARE_PROVIDER_SITE_OTHER): Payer: Medicare Other | Admitting: *Deleted

## 2013-12-09 ENCOUNTER — Ambulatory Visit (INDEPENDENT_AMBULATORY_CARE_PROVIDER_SITE_OTHER): Payer: Medicare Other | Admitting: Nurse Practitioner

## 2013-12-09 ENCOUNTER — Encounter: Payer: Self-pay | Admitting: Nurse Practitioner

## 2013-12-09 VITALS — BP 122/80 | HR 56 | Ht 62.0 in | Wt 116.1 lb

## 2013-12-09 DIAGNOSIS — R002 Palpitations: Secondary | ICD-10-CM

## 2013-12-09 DIAGNOSIS — E785 Hyperlipidemia, unspecified: Secondary | ICD-10-CM | POA: Diagnosis not present

## 2013-12-09 DIAGNOSIS — I10 Essential (primary) hypertension: Secondary | ICD-10-CM

## 2013-12-09 LAB — BASIC METABOLIC PANEL
BUN: 12 mg/dL (ref 6–23)
CO2: 30 mEq/L (ref 19–32)
Calcium: 9.8 mg/dL (ref 8.4–10.5)
Chloride: 95 mEq/L — ABNORMAL LOW (ref 96–112)
Creatinine, Ser: 0.6 mg/dL (ref 0.4–1.2)
GFR: 104.83 mL/min (ref >60.00)
Glucose, Bld: 97 mg/dL (ref 70–99)
Potassium: 3.4 mEq/L — ABNORMAL LOW (ref 3.5–5.1)
Sodium: 133 mEq/L — ABNORMAL LOW (ref 135–145)

## 2013-12-09 LAB — HEPATIC FUNCTION PANEL
ALT: 14 U/L (ref 0–35)
AST: 14 U/L (ref 0–37)
Albumin: 4.4 g/dL (ref 3.5–5.2)
Alkaline Phosphatase: 64 U/L (ref 39–117)
Bilirubin, Direct: 0.1 mg/dL (ref 0.0–0.3)
Total Bilirubin: 1.2 mg/dL (ref 0.2–1.2)
Total Protein: 7.5 g/dL (ref 6.0–8.3)

## 2013-12-09 LAB — LIPID PANEL
Cholesterol: 211 mg/dL — ABNORMAL HIGH (ref 0–200)
HDL: 63.3 mg/dL (ref >39.00)
LDL Cholesterol: 135 mg/dL — ABNORMAL HIGH (ref 0–99)
NonHDL: 147.7
Total CHOL/HDL Ratio: 3
Triglycerides: 64 mg/dL (ref 0.0–149.0)
VLDL: 12.8 mg/dL (ref 0.0–40.0)

## 2013-12-09 MED ORDER — LOSARTAN POTASSIUM-HCTZ 100-12.5 MG PO TABS: ORAL_TABLET | ORAL | Status: DC

## 2013-12-09 MED ORDER — METOPROLOL SUCCINATE ER 25 MG PO TB24: 25.0000 mg | ORAL_TABLET | Freq: Every day | ORAL | Status: DC

## 2013-12-09 NOTE — Progress Notes (Signed)
Deborah Lowe Date of Birth: [birth year] Medical Record #[id number]  History of Present Illness: Deborah Lowe is seen back today for a one year check. Seen for Deborah Lowe - former patient of Deborah Lowe. She has had prior breast cancer, HTN, palpitations and has normal coronaries and renal arteries from a 2008 evaluation  Seen a year ago - was doing well.  Comes back today. Here alone. She is doing well. No chest pain. Not short of breath. No palpitations. BP looks great at home. She remains very active.   Current Outpatient Prescriptions  Medication Sig Dispense Refill  . anastrozole (ARIMIDEX) 1 MG tablet TAKE 1 TABLET (1 MG TOTAL) BY MOUTH DAILY.  30 tablet  11  . losartan-hydrochlorothiazide (HYZAAR) 100-12.5 MG per tablet TAKE 1 TABLET BY MOUTH DAILY.  30 tablet  6  . metoprolol succinate (TOPROL-XL) 25 MG 24 hr tablet Take 1 tablet (25 mg total) by mouth daily.  30 tablet  6   No current facility-administered medications for this visit.    Allergies  Allergen Reactions  . Avelox [Moxifloxacin Hcl In Nacl] Anaphylaxis    Past Medical History  Diagnosis Date  . Hypertension   . Wears glasses   . Ringing in ears   . Palpitations     on beta blocker therapy  . Breast cancer 2012    s/p lumpectomy of left breast with XRT    Past Surgical History  Procedure Laterality Date  . Cholecystectomy    . Tear duct probing    . Cardiac catheterization  01/29/2007  . Cardiovascular stress test  07/19/2002    EF 84%    History  Smoking status  . Never Smoker   Smokeless tobacco  . Never Used    History  Alcohol Use No    Family History  Problem Relation Age of Onset  . Cancer Mother     colon  . Heart disease Father     Review of Systems: The review of systems is per the HPI.  All other systems were reviewed and are negative.  Physical Exam: BP 122/80  Pulse 56  Ht 5\' 2"  (1.575 m)  Wt 116 lb 1.9 oz (52.672 kg)  BMI 21.23 kg/m2 Patient is very pleasant and in  no acute distress. Skin is warm and dry. Color is normal.  HEENT is unremarkable. Normocephalic/atraumatic. PERRL. Sclera are nonicteric. Neck is supple. No masses. No JVD. Lungs are clear. Cardiac exam shows a regular rate and rhythm. Abdomen is soft. Extremities are without edema. Gait and ROM are intact. No gross neurologic deficits noted.  Wt Readings from Last 3 Encounters:  12/09/13 116 lb 1.9 oz (52.672 kg)  07/08/13 117 lb (53.071 kg)  07/07/13 116 lb 12.8 oz (52.98 kg)    LABORATORY DATA/PROCEDURES:  Lab Results  Component Value Date   WBC 6.2 07/07/2013   HGB 13.7 07/07/2013   HCT 40.5 07/07/2013   PLT 220 07/07/2013   GLUCOSE 93 07/07/2013   CHOL 213* 12/07/2012   TRIG 91 05/30/2013   HDL 64 05/30/2013   LDLDIRECT 140.5 12/07/2012   LDLCALC 117* 05/30/2013   ALT 13 07/07/2013   AST 13 07/07/2013   NA 133* 07/07/2013   K 3.6 07/07/2013   CL 98 12/07/2012   CREATININE 0.7 07/07/2013   BUN 11.4 07/07/2013   CO2 28 07/07/2013   TSH 2.460 12/23/2012   HGBA1C 5.6 05/30/2013    BNP (last 3 results) No results found for this basename: PROBNP,  in the last 8760 hours   Assessment / Plan: 1. HTN - great BP control. I have refilled her medicines. She will continue to monitor at home. See again in one year.  2. Palpitations - pretty much quiescent.  We will check her follow up labs today. See again in one year. No change in current regimen.   Patient is agreeable to this plan and will call if any problems develop in the interim.   Deborah Junes, RN, Deborah Lowe [address] [place], [place]  [address] [phone]

## 2013-12-09 NOTE — Patient Instructions (Addendum)
We will check labs today  Stay on your current medicines  I have refilled your medicine today  Stay active  I will see you in a year  Call the [place] office at [phone] if you have any questions, problems or concerns.

## 2014-01-07 ENCOUNTER — Telehealth: Payer: Self-pay | Admitting: Hematology and Oncology

## 2014-01-07 NOTE — Telephone Encounter (Signed)
, °

## 2014-01-16 ENCOUNTER — Ambulatory Visit: Payer: Medicare Other | Admitting: Oncology

## 2014-01-16 ENCOUNTER — Other Ambulatory Visit: Payer: Medicare Other

## 2014-01-19 ENCOUNTER — Ambulatory Visit: Payer: Medicare Other | Admitting: Oncology

## 2014-01-19 ENCOUNTER — Other Ambulatory Visit: Payer: Medicare Other

## 2014-02-02 ENCOUNTER — Other Ambulatory Visit: Payer: Self-pay

## 2014-02-02 DIAGNOSIS — C50419 Malignant neoplasm of upper-outer quadrant of unspecified female breast: Secondary | ICD-10-CM

## 2014-02-03 ENCOUNTER — Ambulatory Visit (HOSPITAL_BASED_OUTPATIENT_CLINIC_OR_DEPARTMENT_OTHER): Payer: Medicare Other | Admitting: Hematology and Oncology

## 2014-02-03 ENCOUNTER — Encounter: Payer: Self-pay | Admitting: Hematology and Oncology

## 2014-02-03 ENCOUNTER — Other Ambulatory Visit (HOSPITAL_BASED_OUTPATIENT_CLINIC_OR_DEPARTMENT_OTHER): Payer: Medicare Other

## 2014-02-03 ENCOUNTER — Telehealth: Payer: Self-pay | Admitting: Hematology and Oncology

## 2014-02-03 VITALS — BP 152/58 | HR 64 | Temp 97.6°F | Resp 18 | Ht 62.0 in | Wt 117.3 lb

## 2014-02-03 DIAGNOSIS — Z780 Asymptomatic menopausal state: Secondary | ICD-10-CM

## 2014-02-03 DIAGNOSIS — C50412 Malignant neoplasm of upper-outer quadrant of left female breast: Secondary | ICD-10-CM

## 2014-02-03 DIAGNOSIS — Z170 Estrogen receptor positive status [ER+]: Secondary | ICD-10-CM

## 2014-02-03 DIAGNOSIS — C50419 Malignant neoplasm of upper-outer quadrant of unspecified female breast: Secondary | ICD-10-CM | POA: Diagnosis not present

## 2014-02-03 DIAGNOSIS — Z79811 Long term (current) use of aromatase inhibitors: Secondary | ICD-10-CM

## 2014-02-03 LAB — COMPREHENSIVE METABOLIC PANEL (CC13)
ALBUMIN: 4 g/dL (ref 3.5–5.0)
ALK PHOS: 67 U/L (ref 40–150)
ALT: 9 U/L (ref 0–55)
AST: 11 U/L (ref 5–34)
Anion Gap: 9 mEq/L (ref 3–11)
BUN: 14.1 mg/dL (ref 7.0–26.0)
CALCIUM: 9.8 mg/dL (ref 8.4–10.4)
CHLORIDE: 95 meq/L — AB (ref 98–109)
CO2: 30 mEq/L — ABNORMAL HIGH (ref 22–29)
Creatinine: 0.8 mg/dL (ref 0.6–1.1)
Glucose: 100 mg/dl (ref 70–140)
POTASSIUM: 4.4 meq/L (ref 3.5–5.1)
SODIUM: 133 meq/L — AB (ref 136–145)
Total Bilirubin: 0.84 mg/dL (ref 0.20–1.20)
Total Protein: 6.8 g/dL (ref 6.4–8.3)

## 2014-02-03 LAB — CBC WITH DIFFERENTIAL/PLATELET
BASO%: 0.6 % (ref 0.0–2.0)
BASOS ABS: 0 10*3/uL (ref 0.0–0.1)
EOS%: 1.2 % (ref 0.0–7.0)
Eosinophils Absolute: 0.1 10*3/uL (ref 0.0–0.5)
HCT: 41 % (ref 34.8–46.6)
HGB: 13.6 g/dL (ref 11.6–15.9)
LYMPH#: 1.5 10*3/uL (ref 0.9–3.3)
LYMPH%: 21.2 % (ref 14.0–49.7)
MCH: 28.8 pg (ref 25.1–34.0)
MCHC: 33.2 g/dL (ref 31.5–36.0)
MCV: 86.6 fL (ref 79.5–101.0)
MONO#: 0.6 10*3/uL (ref 0.1–0.9)
MONO%: 8.5 % (ref 0.0–14.0)
NEUT%: 68.5 % (ref 38.4–76.8)
NEUTROS ABS: 4.9 10*3/uL (ref 1.5–6.5)
Platelets: 261 10*3/uL (ref 145–400)
RBC: 4.73 10*6/uL (ref 3.70–5.45)
RDW: 12.8 % (ref 11.2–14.5)
WBC: 7.1 10*3/uL (ref 3.9–10.3)

## 2014-02-03 NOTE — Assessment & Plan Note (Signed)
1. Left breast cancer status post lumpectomy, invasive ductal carcinoma ER 80% PR 10% Ki-67 is 60% T1 C. N0 M0 stage IA status post radiation therapy and currently on Arimidex for the past 3 years. Tolerating it extremely well without any major problems or concerns.  2. Recommended surveillance would be with annual mammograms which she will need to be set up some. Today's breast exam did not reveal any lumps or nodules.  3. She will come back in 6 months for routine followup and blood work. This blood was reviewed and was felt to be normal. Patient has orders for mammogram and bone density test and she will make an appointment and get those done.  4. Discussed the importance of physical exercise in decreasing the likelihood of breast cancer recurrence. Recommended 30 mins daily 6 days a week of either brisk walking or cycling or swimming. Encouraged patient to eat more fruits and vegetables and decrease red meat.

## 2014-02-03 NOTE — Progress Notes (Signed)
Patient Care Team: Chipper Herb, MD as PCP - General (Family Medicine)  DIAGNOSIS: Carcinoma of breast upper outer quadrant   Primary site: Breast (Left)   Staging method: AJCC 7th Edition   Pathologic: Stage IA (T1c, N0, cM0) signed by Rulon Eisenmenger, MD on 02/03/2014  7:44 AM   Summary: Stage IA (T1c, N0, cM0)   SUMMARY OF ONCOLOGIC HISTORY:   Carcinoma of breast upper outer quadrant   12/25/2010 Surgery Left breast lumpectomy invasive ductal carcinoma with high-grade DCIS ER 80% PR 10% Ki-67 60% T1 C. N0 M0 stage IA (IDC measured 0.1 cm)   02/04/2011 - 03/06/2011 Radiation Therapy Radiation therapy to lumpectomy site by Dr. Valere Dross   04/26/2011 -  Anti-estrogen oral therapy Arimidex 1 mg daily. Plan is to treat her for 5 years    CHIEF COMPLIANT: Routine 6 month followup for her history of breast cancer  INTERVAL HISTORY: Deborah Lowe is a 73 year old Caucasian lady with above-mentioned history of very small left breast cancer that was removed with lumpectomy followed by radiation therapy and she has been on antiestrogen therapy for 2 and half years. She has been tolerating it very well without any major problems. She denies any hot flashes or muscle aches or pains. She gets annual GYN checkups. She has not had a mammogram this year although the orders have been sent. She will make an appointment to get those done. She denies any new lumps or nodules. She is here today accompanied by her husband.   REVIEW OF SYSTEMS:   Constitutional: Denies fevers, chills or abnormal weight loss Eyes: Denies blurriness of vision Ears, nose, mouth, throat, and face: Denies mucositis or sore throat Respiratory: Denies cough, dyspnea or wheezes Cardiovascular: Denies palpitation, chest discomfort or lower extremity swelling Gastrointestinal:  Denies nausea, heartburn or change in bowel habits Skin: Denies abnormal skin rashes Lymphatics: Denies new lymphadenopathy or easy bruising Neurological:Denies  numbness, tingling or new weaknesses Behavioral/Psych: Mood is stable, no new changes  Breast:  denies any pain or lumps or nodules in either breasts All other systems were reviewed with the patient and are negative.  I have reviewed the past medical history, past surgical history, social history and family history with the patient and they are unchanged from previous note.  ALLERGIES:  is allergic to avelox.  MEDICATIONS:  Current Outpatient Prescriptions  Medication Sig Dispense Refill  . anastrozole (ARIMIDEX) 1 MG tablet TAKE 1 TABLET (1 MG TOTAL) BY MOUTH DAILY.  30 tablet  11  . losartan-hydrochlorothiazide (HYZAAR) 100-12.5 MG per tablet TAKE 1 TABLET BY MOUTH DAILY.  90 tablet  3  . metoprolol succinate (TOPROL-XL) 25 MG 24 hr tablet Take 1 tablet (25 mg total) by mouth daily.  90 tablet  3   No current facility-administered medications for this visit.    PHYSICAL EXAMINATION: ECOG PERFORMANCE STATUS: 0 - Asymptomatic  Filed Vitals:   02/03/14 0914  BP: 152/58  Pulse: 64  Temp: 97.6 F (36.4 C)  Resp: 18   Filed Weights   02/03/14 0914  Weight: 117 lb 4.8 oz (53.207 kg)    GENERAL:alert, no distress and comfortable SKIN: skin color, texture, turgor are normal, no rashes or significant lesions EYES: normal, Conjunctiva are pink and non-injected, sclera clear OROPHARYNX:no exudate, no erythema and lips, buccal mucosa, and tongue normal  NECK: supple, thyroid normal size, non-tender, without nodularity LYMPH:  no palpable lymphadenopathy in the cervical, axillary or inguinal LUNGS: clear to auscultation and percussion with normal breathing effort  HEART: regular rate & rhythm and no murmurs and no lower extremity edema ABDOMEN:abdomen soft, non-tender and normal bowel sounds Musculoskeletal:no cyanosis of digits and no clubbing  NEURO: alert & oriented x 3 with fluent speech, no focal motor/sensory deficits BREAST: No palpable masses or nodules in either right or  left breasts. No palpable axillary supraclavicular or infraclavicular adenopathy no breast tenderness or nipple discharge.   LABORATORY DATA:  I have reviewed the data as listed   Chemistry      Component Value Date/Time   NA 133* 02/03/2014 0855   NA 133* 12/09/2013 1014   K 4.4 02/03/2014 0855   K 3.4* 12/09/2013 1014   CL 95* 12/09/2013 1014   CL 97* 08/04/2012 1010   CO2 30* 02/03/2014 0855   CO2 30 12/09/2013 1014   BUN 14.1 02/03/2014 0855   BUN 12 12/09/2013 1014   CREATININE 0.8 02/03/2014 0855   CREATININE 0.6 12/09/2013 1014      Component Value Date/Time   CALCIUM 9.8 02/03/2014 0855   CALCIUM 9.8 12/09/2013 1014   ALKPHOS 67 02/03/2014 0855   ALKPHOS 64 12/09/2013 1014   AST 11 02/03/2014 0855   AST 14 12/09/2013 1014   ALT 9 02/03/2014 0855   ALT 14 12/09/2013 1014   BILITOT 0.84 02/03/2014 0855   BILITOT 1.2 12/09/2013 1014       Lab Results  Component Value Date   WBC 7.1 02/03/2014   HGB 13.6 02/03/2014   HCT 41.0 02/03/2014   MCV 86.6 02/03/2014   PLT 261 02/03/2014   NEUTROABS 4.9 02/03/2014     RADIOGRAPHIC STUDIES: I have personally reviewed the radiology reports and agreed with their findings. No results found.   ASSESSMENT & PLAN:  Carcinoma of breast upper outer quadrant 1. Left breast cancer status post lumpectomy, invasive ductal carcinoma ER 80% PR 10% Ki-67 is 60% T1 C. N0 M0 stage IA status post radiation therapy and currently on Arimidex for the past 3 years. Tolerating it extremely well without any major problems or concerns.  2. Recommended surveillance would be with annual mammograms which she will need to be set up some. Today's breast exam did not reveal any lumps or nodules.  3. She will come back in 6 months for routine followup and blood work. This blood was reviewed and was felt to be normal. Patient has orders for mammogram and bone density test and she will make an appointment and get those done.  4. Discussed the importance of physical exercise  in decreasing the likelihood of breast cancer recurrence. Recommended 30 mins daily 6 days a week of either brisk walking or cycling or swimming. Encouraged patient to eat more fruits and vegetables and decrease red meat.     Orders Placed This Encounter  Procedures  . CBC with Differential    Standing Status: Future     Number of Occurrences:      Standing Expiration Date: 02/03/2015  . Comprehensive metabolic panel (Cmet) - [facility]    Standing Status: Future     Number of Occurrences:      Standing Expiration Date: 02/03/2015   The patient has a good understanding of the overall plan. she agrees with it. She will call with any problems that may develop before her next visit here.  I spent 25 minutes counseling the patient face to face. The total time spent in the appointment was 30 minutes and more than 50% was on counseling and review of test results  Rulon Eisenmenger, MD 02/03/2014 9:44 AM

## 2014-02-03 NOTE — Addendum Note (Signed)
Addended by: Prentiss Bells on: 02/03/2014 01:01 PM   Modules accepted: Orders

## 2014-02-08 ENCOUNTER — Other Ambulatory Visit: Payer: Medicare Other

## 2014-02-08 ENCOUNTER — Ambulatory Visit: Payer: Medicare Other | Admitting: Hematology and Oncology

## 2014-02-09 ENCOUNTER — Telehealth: Payer: Self-pay | Admitting: *Deleted

## 2014-02-09 NOTE — Telephone Encounter (Signed)
Order for both bone density and bilateral mammogram faxed to [place]. Original sent to HIM to be scanned into patient's chart.

## 2014-02-11 DIAGNOSIS — M79609 Pain in unspecified limb: Secondary | ICD-10-CM | POA: Diagnosis not present

## 2014-02-17 DIAGNOSIS — M810 Age-related osteoporosis without current pathological fracture: Secondary | ICD-10-CM | POA: Diagnosis not present

## 2014-02-17 DIAGNOSIS — Z853 Personal history of malignant neoplasm of breast: Secondary | ICD-10-CM | POA: Diagnosis not present

## 2014-02-27 ENCOUNTER — Telehealth: Payer: Self-pay

## 2014-02-27 NOTE — Telephone Encounter (Signed)
Bone density report rcvd from [facility] 02/17/14 Dr Marcelo Baldy.  Copy to Dr Lindi Adie.  Original to scan.

## 2014-03-01 DIAGNOSIS — J209 Acute bronchitis, unspecified: Secondary | ICD-10-CM | POA: Diagnosis not present

## 2014-03-04 ENCOUNTER — Telehealth: Payer: Self-pay

## 2014-03-04 NOTE — Telephone Encounter (Signed)
Rcvd bone density and mammogram results from [place].  Reviewed by Dr Lindi Adie.  Sent to scan.

## 2014-03-10 ENCOUNTER — Telehealth: Payer: Self-pay | Admitting: Family Medicine

## 2014-03-10 ENCOUNTER — Encounter: Payer: Self-pay | Admitting: Family Medicine

## 2014-03-10 ENCOUNTER — Ambulatory Visit (INDEPENDENT_AMBULATORY_CARE_PROVIDER_SITE_OTHER): Payer: Medicare Other | Admitting: Family Medicine

## 2014-03-10 VITALS — BP 138/69 | HR 69 | Temp 97.0°F | Ht 62.0 in | Wt 117.0 lb

## 2014-03-10 DIAGNOSIS — R05 Cough: Secondary | ICD-10-CM

## 2014-03-10 DIAGNOSIS — R059 Cough, unspecified: Secondary | ICD-10-CM

## 2014-03-10 DIAGNOSIS — J209 Acute bronchitis, unspecified: Secondary | ICD-10-CM | POA: Diagnosis not present

## 2014-03-10 DIAGNOSIS — J029 Acute pharyngitis, unspecified: Secondary | ICD-10-CM

## 2014-03-10 LAB — POCT INFLUENZA A/B
Influenza A, POC: NEGATIVE
Influenza B, POC: NEGATIVE

## 2014-03-10 LAB — POCT RAPID STREP A (OFFICE): Rapid Strep A Screen: NEGATIVE

## 2014-03-10 MED ORDER — BENZONATATE 100 MG PO CAPS: 100.0000 mg | ORAL_CAPSULE | Freq: Three times a day (TID) | ORAL | Status: DC | PRN

## 2014-03-10 MED ORDER — AZITHROMYCIN 250 MG PO TABS: ORAL_TABLET | ORAL | Status: DC

## 2014-03-10 NOTE — Telephone Encounter (Signed)
appt made

## 2014-03-10 NOTE — Progress Notes (Signed)
   Subjective:    Patient ID: Deborah Lowe, female    DOB: 10-26-1940, 73 y.o.   MRN: [id number]  HPI This 73 y.o. female presents for evaluation of uri sx's and cough.   Review of Systems No chest pain, SOB, HA, dizziness, vision change, N/V, diarrhea, constipation, dysuria, urinary urgency or frequency, myalgias, arthralgias or rash.     Objective:   Physical Exam Vital signs noted  Well developed well nourished female.  HEENT - Head atraumatic Normocephalic                Eyes - PERRLA, Conjuctiva - clear Sclera- Clear EOMI                Ears - EAC's Wnl TM's Wnl Gross Hearing WNL                Nose - Nares patent                 Throat - oropharanx wnl Respiratory - Lungs CTA bilateral Cardiac - RRR S1 and S2 without murmur GI - Abdomen soft Nontender and bowel sounds active x 4 Extremities - No edema. Neuro - Grossly intact.       Assessment & Plan:  Sore throat - Plan: POCT Influenza A/B, POCT rapid strep A, azithromycin (ZITHROMAX) 250 MG tablet, benzonatate (TESSALON PERLES) 100 MG capsule  Cough - Plan: POCT Influenza A/B, POCT rapid strep A, azithromycin (ZITHROMAX) 250 MG tablet, benzonatate (TESSALON PERLES) 100 MG capsule  Acute bronchitis, unspecified organism - Plan: azithromycin (ZITHROMAX) 250 MG tablet, benzonatate (TESSALON PERLES) 100 MG capsule  Lysbeth Penner FNP

## 2014-04-04 DIAGNOSIS — I10 Essential (primary) hypertension: Secondary | ICD-10-CM | POA: Diagnosis not present

## 2014-04-04 DIAGNOSIS — C50412 Malignant neoplasm of upper-outer quadrant of left female breast: Secondary | ICD-10-CM | POA: Diagnosis not present

## 2014-05-06 ENCOUNTER — Other Ambulatory Visit: Payer: Self-pay | Admitting: Cardiovascular Disease

## 2014-05-12 ENCOUNTER — Other Ambulatory Visit: Payer: Self-pay | Admitting: Oncology

## 2014-06-06 ENCOUNTER — Other Ambulatory Visit: Payer: Self-pay | Admitting: Obstetrics and Gynecology

## 2014-06-06 DIAGNOSIS — Z124 Encounter for screening for malignant neoplasm of cervix: Secondary | ICD-10-CM | POA: Diagnosis not present

## 2014-06-08 LAB — CYTOLOGY - PAP

## 2014-07-18 DIAGNOSIS — K635 Polyp of colon: Secondary | ICD-10-CM | POA: Diagnosis not present

## 2014-07-18 DIAGNOSIS — Z800 Family history of malignant neoplasm of digestive organs: Secondary | ICD-10-CM | POA: Diagnosis not present

## 2014-07-18 DIAGNOSIS — Z1211 Encounter for screening for malignant neoplasm of colon: Secondary | ICD-10-CM | POA: Diagnosis not present

## 2014-07-18 DIAGNOSIS — K648 Other hemorrhoids: Secondary | ICD-10-CM | POA: Diagnosis not present

## 2014-07-19 ENCOUNTER — Other Ambulatory Visit: Payer: Self-pay | Admitting: Gastroenterology

## 2014-07-19 DIAGNOSIS — D125 Benign neoplasm of sigmoid colon: Secondary | ICD-10-CM | POA: Diagnosis not present

## 2014-08-04 ENCOUNTER — Ambulatory Visit (HOSPITAL_BASED_OUTPATIENT_CLINIC_OR_DEPARTMENT_OTHER): Payer: Medicare Other | Admitting: Hematology and Oncology

## 2014-08-04 ENCOUNTER — Telehealth: Payer: Self-pay | Admitting: Hematology and Oncology

## 2014-08-04 ENCOUNTER — Other Ambulatory Visit (HOSPITAL_BASED_OUTPATIENT_CLINIC_OR_DEPARTMENT_OTHER): Payer: Medicare Other

## 2014-08-04 VITALS — BP 148/70 | HR 54 | Temp 98.1°F | Resp 18 | Ht 62.0 in | Wt 118.8 lb

## 2014-08-04 DIAGNOSIS — C50412 Malignant neoplasm of upper-outer quadrant of left female breast: Secondary | ICD-10-CM

## 2014-08-04 DIAGNOSIS — M810 Age-related osteoporosis without current pathological fracture: Secondary | ICD-10-CM | POA: Diagnosis not present

## 2014-08-04 DIAGNOSIS — Z170 Estrogen receptor positive status [ER+]: Secondary | ICD-10-CM | POA: Diagnosis not present

## 2014-08-04 LAB — COMPREHENSIVE METABOLIC PANEL (CC13)
ALK PHOS: 75 U/L (ref 40–150)
ALT: 13 U/L (ref 0–55)
AST: 13 U/L (ref 5–34)
Albumin: 3.9 g/dL (ref 3.5–5.0)
Anion Gap: 8 mEq/L (ref 3–11)
BUN: 11.8 mg/dL (ref 7.0–26.0)
CO2: 30 mEq/L — ABNORMAL HIGH (ref 22–29)
Calcium: 9.6 mg/dL (ref 8.4–10.4)
Chloride: 100 mEq/L (ref 98–109)
Creatinine: 0.7 mg/dL (ref 0.6–1.1)
EGFR: 87 mL/min/{1.73_m2} — AB (ref >90)
GLUCOSE: 94 mg/dL (ref 70–140)
Potassium: 4.1 mEq/L (ref 3.5–5.1)
SODIUM: 137 meq/L (ref 136–145)
TOTAL PROTEIN: 6.9 g/dL (ref 6.4–8.3)
Total Bilirubin: 1.03 mg/dL (ref 0.20–1.20)

## 2014-08-04 LAB — CBC WITH DIFFERENTIAL/PLATELET
BASO%: 0.4 % (ref 0.0–2.0)
BASOS ABS: 0 10*3/uL (ref 0.0–0.1)
EOS%: 2.5 % (ref 0.0–7.0)
Eosinophils Absolute: 0.1 10*3/uL (ref 0.0–0.5)
HEMATOCRIT: 40.2 % (ref 34.8–46.6)
HEMOGLOBIN: 13.6 g/dL (ref 11.6–15.9)
LYMPH#: 1.6 10*3/uL (ref 0.9–3.3)
LYMPH%: 34 % (ref 14.0–49.7)
MCH: 29.3 pg (ref 25.1–34.0)
MCHC: 33.8 g/dL (ref 31.5–36.0)
MCV: 86.6 fL (ref 79.5–101.0)
MONO#: 0.4 10*3/uL (ref 0.1–0.9)
MONO%: 8.6 % (ref 0.0–14.0)
NEUT#: 2.6 10*3/uL (ref 1.5–6.5)
NEUT%: 54.5 % (ref 38.4–76.8)
PLATELETS: 245 10*3/uL (ref 145–400)
RBC: 4.64 10*6/uL (ref 3.70–5.45)
RDW: 12.8 % (ref 11.2–14.5)
WBC: 4.8 10*3/uL (ref 3.9–10.3)

## 2014-08-04 MED ORDER — ANASTROZOLE 1 MG PO TABS
ORAL_TABLET | ORAL | Status: DC
Start: 2014-08-04 — End: 2015-03-10

## 2014-08-04 MED ORDER — TAMOXIFEN CITRATE 20 MG PO TABS: 20.0000 mg | ORAL_TABLET | Freq: Every day | ORAL | Status: DC

## 2014-08-04 NOTE — Assessment & Plan Note (Signed)
Left breast cancer status post lumpectomy, invasive ductal carcinoma ER 80% PR 10% Ki-67 is 60% T1 C. N0 M0 stage IA status post radiation therapy and currently on Arimidex since December 2012  Arimidex toxicities: 1. Bone density 02/17/2014 revealed a T score of -3.4 which is profound osteoporosis Osteoporosis management: Patient will need to take bisphosphonate therapy with calcium and vitamin D. I discussed with her about switching from anastrozole to tamoxifen.  Breast cancer surveillance: 1. Breast exam 08/04/2014 is normal 2. Mammogram 02/17/2014 is normal

## 2014-08-04 NOTE — Progress Notes (Signed)
Patient Care Team: Chipper Herb, MD as PCP - General (Family Medicine)  DIAGNOSIS: Carcinoma of upper-outer quadrant of left female breast   Staging form: Breast, AJCC 7th Edition     Clinical: No stage assigned - Unsigned     Pathologic: Stage IA (T1c, N0, cM0) - Signed by Rulon Eisenmenger, MD on 02/03/2014   SUMMARY OF ONCOLOGIC HISTORY:   Carcinoma of upper-outer quadrant of left female breast   12/25/2010 Surgery Left breast lumpectomy invasive ductal carcinoma with high-grade DCIS ER 80% PR 10% Ki-67 60% T1 a. N0 M0 stage IA (IDC measured 0.1 cm)   02/04/2011 - 03/06/2011 Radiation Therapy Radiation therapy to lumpectomy site by Dr. Valere Dross   04/26/2011 -  Anti-estrogen oral therapy Arimidex 1 mg daily. Plan is to treat her for 5 years    CHIEF COMPLIANT: Follow-up on Arimidex therapy  INTERVAL HISTORY: Deborah Lowe is a 74 year old lady with above-mentioned history of left-sided breast cancer who underwent lumpectomy followed by radiation and is now on antiestrogen therapy with Arimidex since December 2012. She completed 3 years of therapy. Bone density test done recently revealed severe osteoporosis. She reports that she is tolerating Arimidex extremely well without any major problems or concerns.  REVIEW OF SYSTEMS:   Constitutional: Denies fevers, chills or abnormal weight loss Eyes: Denies blurriness of vision Ears, nose, mouth, throat, and face: Denies mucositis or sore throat Respiratory: Denies cough, dyspnea or wheezes Cardiovascular: Denies palpitation, chest discomfort or lower extremity swelling Gastrointestinal:  Denies nausea, heartburn or change in bowel habits Skin: Denies abnormal skin rashes Lymphatics: Denies new lymphadenopathy or easy bruising Neurological:Denies numbness, tingling or new weaknesses Behavioral/Psych: Mood is stable, no new changes  Breast:  denies any pain or lumps or nodules in either breasts All other systems were reviewed with the patient and  are negative.  I have reviewed the past medical history, past surgical history, social history and family history with the patient and they are unchanged from previous note.  ALLERGIES:  is allergic to avelox.  MEDICATIONS:  Current Outpatient Prescriptions  Medication Sig Dispense Refill  . anastrozole (ARIMIDEX) 1 MG tablet TAKE 1 TABLET (1 MG TOTAL) BY MOUTH DAILY. 30 tablet 2  . azithromycin (ZITHROMAX) 250 MG tablet Take 2 po first day and then one po qd x 4 days 6 tablet 0  . benzonatate (TESSALON PERLES) 100 MG capsule Take 1 capsule (100 mg total) by mouth 3 (three) times daily as needed. 20 capsule 1  . losartan-hydrochlorothiazide (HYZAAR) 100-12.5 MG per tablet TAKE 1 TABLET BY MOUTH DAILY. 90 tablet 3  . metoprolol succinate (TOPROL-XL) 25 MG 24 hr tablet Take 1 tablet (25 mg total) by mouth daily. 90 tablet 3   No current facility-administered medications for this visit.    PHYSICAL EXAMINATION: ECOG PERFORMANCE STATUS: 0 - Asymptomatic  Filed Vitals:   08/04/14 0943  BP: 148/70  Pulse: 54  Temp: 98.1 F (36.7 C)  Resp: 18   Filed Weights   08/04/14 0943  Weight: 118 lb 12.8 oz (53.887 kg)    GENERAL:alert, no distress and comfortable SKIN: skin color, texture, turgor are normal, no rashes or significant lesions EYES: normal, Conjunctiva are pink and non-injected, sclera clear OROPHARYNX:no exudate, no erythema and lips, buccal mucosa, and tongue normal  NECK: supple, thyroid normal size, non-tender, without nodularity LYMPH:  no palpable lymphadenopathy in the cervical, axillary or inguinal LUNGS: clear to auscultation and percussion with normal breathing effort HEART: regular rate & rhythm  and no murmurs and no lower extremity edema ABDOMEN:abdomen soft, non-tender and normal bowel sounds Musculoskeletal:no cyanosis of digits and no clubbing  NEURO: alert & oriented x 3 with fluent speech, no focal motor/sensory deficits  LABORATORY DATA:  I have reviewed  the data as listed   Chemistry      Component Value Date/Time   NA 133* 02/03/2014 0855   NA 133* 12/09/2013 1014   K 4.4 02/03/2014 0855   K 3.4* 12/09/2013 1014   CL 95* 12/09/2013 1014   CL 97* 08/04/2012 1010   CO2 30* 02/03/2014 0855   CO2 30 12/09/2013 1014   BUN 14.1 02/03/2014 0855   BUN 12 12/09/2013 1014   CREATININE 0.8 02/03/2014 0855   CREATININE 0.6 12/09/2013 1014      Component Value Date/Time   CALCIUM 9.8 02/03/2014 0855   CALCIUM 9.8 12/09/2013 1014   ALKPHOS 67 02/03/2014 0855   ALKPHOS 64 12/09/2013 1014   AST 11 02/03/2014 0855   AST 14 12/09/2013 1014   ALT 9 02/03/2014 0855   ALT 14 12/09/2013 1014   BILITOT 0.84 02/03/2014 0855   BILITOT 1.2 12/09/2013 1014       Lab Results  Component Value Date   WBC 4.8 08/04/2014   HGB 13.6 08/04/2014   HCT 40.2 08/04/2014   MCV 86.6 08/04/2014   PLT 245 08/04/2014   NEUTROABS 2.6 08/04/2014     RADIOGRAPHIC STUDIES: I have personally reviewed the radiology reports and agreed with their findings. Mammogram 02/17/2014 is normal Bone density showing a T score of -3.4 showing profound osteoporosis   ASSESSMENT & PLAN:  Carcinoma of upper-outer quadrant of left female breast Left breast cancer status post lumpectomy, invasive ductal carcinoma ER 80% PR 10% Ki-67 is 60% T1 C. N0 M0 stage IA status post radiation therapy and currently on Arimidex since December 2012  Arimidex toxicities: 1. Bone density 02/17/2014 revealed a T score of -3.4 which is profound osteoporosis Osteoporosis management: Patient will need to take bisphosphonate therapy with calcium and vitamin D. I discussed with her different options and recommended Prolia every 6 months. Our plan is to treat her for 4 years and then give her a treatment break. I discussed with her about switching from anastrozole to tamoxifen.she is agreeable. Hence we will switch her from tamoxifen.  Breast cancer surveillance: 1. Breast exam 08/04/2014 is  normal 2. Mammogram 02/17/2014 is normal  Three-month follow-up to assess tolerability to tamoxifen  No orders of the defined types were placed in this encounter.   The patient has a good understanding of the overall plan. she agrees with it. She will call with any problems that may develop before her next visit here.   Rulon Eisenmenger, MD

## 2014-08-04 NOTE — Telephone Encounter (Signed)
appts made and avs printed for pt  Deborah Lowe °

## 2014-08-10 ENCOUNTER — Other Ambulatory Visit: Payer: Self-pay

## 2014-08-10 NOTE — Progress Notes (Signed)
Patient is schedule for eye surgery in April, but unsure if exact date at this time.  Patient told to begin Tamoxifen until 2 weeks after eye surgery and re-schedule for Prolia injection 1 month after surgery, per Dr. Lindi Adie.  Patient is aware and verbalizes understanding.

## 2014-08-11 ENCOUNTER — Ambulatory Visit: Payer: Medicare Other

## 2014-08-11 ENCOUNTER — Telehealth: Payer: Self-pay | Admitting: Hematology and Oncology

## 2014-08-11 NOTE — Telephone Encounter (Signed)
per pof ot Cancel injection per Gud[place]-pt having eye surgery and will r/s @ a later date

## 2014-09-05 DIAGNOSIS — H04561 Stenosis of right lacrimal punctum: Secondary | ICD-10-CM | POA: Diagnosis not present

## 2014-09-05 DIAGNOSIS — H04201 Unspecified epiphora, right lacrimal gland: Secondary | ICD-10-CM | POA: Diagnosis not present

## 2014-09-05 DIAGNOSIS — H04541 Stenosis of right lacrimal canaliculi: Secondary | ICD-10-CM | POA: Diagnosis not present

## 2014-09-05 DIAGNOSIS — Z961 Presence of intraocular lens: Secondary | ICD-10-CM | POA: Insufficient documentation

## 2014-09-05 DIAGNOSIS — Z9689 Presence of other specified functional implants: Secondary | ICD-10-CM | POA: Diagnosis not present

## 2014-09-05 DIAGNOSIS — I10 Essential (primary) hypertension: Secondary | ICD-10-CM | POA: Diagnosis not present

## 2014-09-05 DIAGNOSIS — Z853 Personal history of malignant neoplasm of breast: Secondary | ICD-10-CM | POA: Diagnosis not present

## 2014-09-20 DIAGNOSIS — Z79899 Other long term (current) drug therapy: Secondary | ICD-10-CM | POA: Diagnosis not present

## 2014-09-20 DIAGNOSIS — Z923 Personal history of irradiation: Secondary | ICD-10-CM | POA: Diagnosis not present

## 2014-09-20 DIAGNOSIS — Z961 Presence of intraocular lens: Secondary | ICD-10-CM | POA: Diagnosis not present

## 2014-09-20 DIAGNOSIS — H04561 Stenosis of right lacrimal punctum: Secondary | ICD-10-CM | POA: Diagnosis not present

## 2014-09-20 DIAGNOSIS — H04551 Acquired stenosis of right nasolacrimal duct: Secondary | ICD-10-CM | POA: Diagnosis not present

## 2014-09-20 DIAGNOSIS — Z888 Allergy status to other drugs, medicaments and biological substances status: Secondary | ICD-10-CM | POA: Diagnosis not present

## 2014-09-20 DIAGNOSIS — H04541 Stenosis of right lacrimal canaliculi: Secondary | ICD-10-CM | POA: Diagnosis not present

## 2014-09-20 DIAGNOSIS — I10 Essential (primary) hypertension: Secondary | ICD-10-CM | POA: Diagnosis not present

## 2014-09-20 DIAGNOSIS — Z853 Personal history of malignant neoplasm of breast: Secondary | ICD-10-CM | POA: Diagnosis not present

## 2014-09-20 DIAGNOSIS — H04419 Chronic dacryocystitis of unspecified lacrimal passage: Secondary | ICD-10-CM | POA: Diagnosis not present

## 2014-09-26 DIAGNOSIS — Z9889 Other specified postprocedural states: Secondary | ICD-10-CM | POA: Diagnosis not present

## 2014-09-26 DIAGNOSIS — H04541 Stenosis of right lacrimal canaliculi: Secondary | ICD-10-CM | POA: Diagnosis not present

## 2014-09-26 DIAGNOSIS — H04561 Stenosis of right lacrimal punctum: Secondary | ICD-10-CM | POA: Diagnosis not present

## 2014-10-11 ENCOUNTER — Encounter: Payer: Self-pay | Admitting: *Deleted

## 2014-10-13 ENCOUNTER — Telehealth: Payer: Self-pay | Admitting: *Deleted

## 2014-10-13 NOTE — Telephone Encounter (Signed)
TC from pt to clarify/verify upcoming appt. Verified appt  For June with her. No other needs identified.

## 2014-10-19 DIAGNOSIS — H04541 Stenosis of right lacrimal canaliculi: Secondary | ICD-10-CM | POA: Diagnosis not present

## 2014-11-09 DIAGNOSIS — H04551 Acquired stenosis of right nasolacrimal duct: Secondary | ICD-10-CM | POA: Diagnosis not present

## 2014-11-15 NOTE — Assessment & Plan Note (Signed)
Left breast cancer status post lumpectomy, invasive ductal carcinoma ER 80% PR 10% Ki-67 is 60% T1 C. N0 M0 stage IA status post radiation therapy and currently on Arimidex since December 2012 switched to Tamoxifen 08/04/14  Arimidex toxicities: 1. Bone density 02/17/2014 revealed a T score of -3.4 which is profound osteoporosis Osteoporosis management: Prolia every 6 months. Our plan is to treat Deborah Lowe for 4 years and then give Deborah Lowe a treatment break. Switched from anastrozole to tamoxifen.  Breast cancer surveillance: 1. Breast exam 6/23//2016 is normal 2. Mammogram 02/17/2014 is normal  RTC 6 months

## 2014-11-16 ENCOUNTER — Ambulatory Visit (HOSPITAL_BASED_OUTPATIENT_CLINIC_OR_DEPARTMENT_OTHER): Payer: Medicare Other | Admitting: Hematology and Oncology

## 2014-11-16 ENCOUNTER — Encounter: Payer: Self-pay | Admitting: Hematology and Oncology

## 2014-11-16 ENCOUNTER — Telehealth: Payer: Self-pay | Admitting: Hematology and Oncology

## 2014-11-16 VITALS — BP 169/64 | HR 53 | Temp 98.0°F | Resp 18 | Ht 62.0 in | Wt 117.7 lb

## 2014-11-16 DIAGNOSIS — M810 Age-related osteoporosis without current pathological fracture: Secondary | ICD-10-CM | POA: Diagnosis not present

## 2014-11-16 DIAGNOSIS — Z923 Personal history of irradiation: Secondary | ICD-10-CM | POA: Diagnosis not present

## 2014-11-16 DIAGNOSIS — Z170 Estrogen receptor positive status [ER+]: Secondary | ICD-10-CM | POA: Diagnosis not present

## 2014-11-16 DIAGNOSIS — C50412 Malignant neoplasm of upper-outer quadrant of left female breast: Secondary | ICD-10-CM | POA: Diagnosis not present

## 2014-11-16 DIAGNOSIS — Z79811 Long term (current) use of aromatase inhibitors: Secondary | ICD-10-CM | POA: Diagnosis not present

## 2014-11-16 MED ORDER — RISEDRONATE SODIUM 150 MG PO TABS: 150.0000 mg | ORAL_TABLET | ORAL | Status: DC

## 2014-11-16 NOTE — Telephone Encounter (Signed)
lvm for pt regarding to DEC appt....mailed pt appt sched/avs and letter °

## 2014-11-16 NOTE — Progress Notes (Signed)
Patient Care Team: Chipper Herb, MD as PCP - General (Family Medicine)  DIAGNOSIS: Carcinoma of upper-outer quadrant of left female breast   Staging form: Breast, AJCC 7th Edition     Clinical: No stage assigned - Unsigned     Pathologic: Stage IA (T1c, N0, cM0) - Signed by Rulon Eisenmenger, MD on 02/03/2014   SUMMARY OF ONCOLOGIC HISTORY:   Carcinoma of upper-outer quadrant of left female breast   12/25/2010 Surgery Left breast lumpectomy invasive ductal carcinoma with high-grade DCIS ER 80% PR 10% Ki-67 60% T1 a. N0 M0 stage IA (IDC measured 0.1 cm)   02/04/2011 - 03/06/2011 Radiation Therapy Radiation therapy to lumpectomy site by Dr. Valere Dross   04/26/2011 -  Anti-estrogen oral therapy Arimidex 1 mg daily. Plan is to treat her for 5 years    CHIEF COMPLIANT: Follow-up on anastrozole  INTERVAL HISTORY: Deborah Lowe is a 74 year old with above-mentioned history of left breast cancer treated with lumpectomy radiation is currently on Arimidex. She is tolerating extremely well. She does have osteoporosis but is reluctant to take any medication for it. She is taking calcium and vitamin D as well as doing aerobic exercises. We talked about switching her from Arimidex to tamoxifen because of severe osteoporosis. But she is reluctant to make any switches. She would like to remain on anastrozole at this time.  REVIEW OF SYSTEMS:   Constitutional: Denies fevers, chills or abnormal weight loss Eyes: Denies blurriness of vision Ears, nose, mouth, throat, and face: Denies mucositis or sore throat Respiratory: Denies cough, dyspnea or wheezes Cardiovascular: Denies palpitation, chest discomfort or lower extremity swelling Gastrointestinal:  Denies nausea, heartburn or change in bowel habits Skin: Denies abnormal skin rashes Lymphatics: Denies new lymphadenopathy or easy bruising Neurological:Denies numbness, tingling or new weaknesses Behavioral/Psych: Mood is stable, no new changes  Breast:  denies  any pain or lumps or nodules in either breasts All other systems were reviewed with the patient and are negative.  I have reviewed the past medical history, past surgical history, social history and family history with the patient and they are unchanged from previous note.  ALLERGIES:  is allergic to avelox.  MEDICATIONS:  Current Outpatient Prescriptions  Medication Sig Dispense Refill  . anastrozole (ARIMIDEX) 1 MG tablet Take 1 mg by mouth.    . losartan-hydrochlorothiazide (HYZAAR) 100-12.5 MG per tablet TAKE 1 TABLET BY MOUTH DAILY. 90 tablet 3  . metoprolol succinate (TOPROL-XL) 25 MG 24 hr tablet Take 1 tablet (25 mg total) by mouth daily. 90 tablet 3   No current facility-administered medications for this visit.    PHYSICAL EXAMINATION: ECOG PERFORMANCE STATUS: 0 - Asymptomatic  Filed Vitals:   11/16/14 1130  BP: 169/64  Pulse: 53  Temp: 98 F (36.7 C)  Resp: 18   Filed Weights   11/16/14 1130  Weight: 117 lb 11.2 oz (53.388 kg)    GENERAL:alert, no distress and comfortable SKIN: skin color, texture, turgor are normal, no rashes or significant lesions EYES: normal, Conjunctiva are pink and non-injected, sclera clear OROPHARYNX:no exudate, no erythema and lips, buccal mucosa, and tongue normal  NECK: supple, thyroid normal size, non-tender, without nodularity LYMPH:  no palpable lymphadenopathy in the cervical, axillary or inguinal LUNGS: clear to auscultation and percussion with normal breathing effort HEART: regular rate & rhythm and no murmurs and no lower extremity edema ABDOMEN:abdomen soft, non-tender and normal bowel sounds Musculoskeletal:no cyanosis of digits and no clubbing  NEURO: alert & oriented x 3 with fluent  speech, no focal motor/sensory deficits BREAST: No palpable masses or nodules in either right or left breasts. No palpable axillary supraclavicular or infraclavicular adenopathy no breast tenderness or nipple discharge. (exam performed in the  presence of a chaperone)  LABORATORY DATA:  I have reviewed the data as listed   Chemistry      Component Value Date/Time   NA 137 08/04/2014 0909   NA 133* 12/09/2013 1014   K 4.1 08/04/2014 0909   K 3.4* 12/09/2013 1014   CL 95* 12/09/2013 1014   CL 97* 08/04/2012 1010   CO2 30* 08/04/2014 0909   CO2 30 12/09/2013 1014   BUN 11.8 08/04/2014 0909   BUN 12 12/09/2013 1014   CREATININE 0.7 08/04/2014 0909   CREATININE 0.6 12/09/2013 1014      Component Value Date/Time   CALCIUM 9.6 08/04/2014 0909   CALCIUM 9.8 12/09/2013 1014   ALKPHOS 75 08/04/2014 0909   ALKPHOS 64 12/09/2013 1014   AST 13 08/04/2014 0909   AST 14 12/09/2013 1014   ALT 13 08/04/2014 0909   ALT 14 12/09/2013 1014   BILITOT 1.03 08/04/2014 0909   BILITOT 1.2 12/09/2013 1014       Lab Results  Component Value Date   WBC 4.8 08/04/2014   HGB 13.6 08/04/2014   HCT 40.2 08/04/2014   MCV 86.6 08/04/2014   PLT 245 08/04/2014   NEUTROABS 2.6 08/04/2014     RADIOGRAPHIC STUDIES: I have personally reviewed the radiology reports and agreed with their findings. Mammogram 02/17/2014 was normal  ASSESSMENT & PLAN:  Carcinoma of upper-outer quadrant of left female breast Left breast cancer status post lumpectomy, invasive ductal carcinoma ER 80% PR 10% Ki-67 is 60% T1 C. N0 M0 stage IA status post radiation therapy and currently on Arimidex since December 2012   Arimidex toxicities: 1. Bone density 02/17/2014 revealed a T score of -3.4 which is profound osteoporosis  Osteoporosis management: Start risedronate 150 mg once a day along with her current supplements of calcium and vitamin D. I discussed with her about osteonecrosis of the jaw risk as well as to not lie down after taking this pill and to take it with a large glass of water  We discussed about switching from anastrozole to tamoxifen but patient is very reluctant to make any switched because she is afraid that tamoxifen will cause more side  effects. She would like to remain on anastrozole.  Breast cancer surveillance: 1. Breast exam 6/23//2016 is normal 2. Mammogram 02/17/2014 is normal  RTC 6 months   No orders of the defined types were placed in this encounter.   The patient has a good understanding of the overall plan. she agrees with it. she will call with any problems that may develop before the next visit here.   Rulon Eisenmenger, MD

## 2014-11-29 ENCOUNTER — Ambulatory Visit (INDEPENDENT_AMBULATORY_CARE_PROVIDER_SITE_OTHER): Payer: Medicare Other | Admitting: Nurse Practitioner

## 2014-11-29 ENCOUNTER — Encounter: Payer: Self-pay | Admitting: Nurse Practitioner

## 2014-11-29 VITALS — BP 140/90 | HR 52 | Ht 62.0 in | Wt 117.4 lb

## 2014-11-29 DIAGNOSIS — I10 Essential (primary) hypertension: Secondary | ICD-10-CM | POA: Diagnosis not present

## 2014-11-29 DIAGNOSIS — R002 Palpitations: Secondary | ICD-10-CM

## 2014-11-29 LAB — HEPATIC FUNCTION PANEL
ALT: 12 U/L (ref 0–35)
AST: 12 U/L (ref 0–37)
Albumin: 4.1 g/dL (ref 3.5–5.2)
Alkaline Phosphatase: 71 U/L (ref 39–117)
Bilirubin, Direct: 0.1 mg/dL (ref 0.0–0.3)
Total Bilirubin: 0.9 mg/dL (ref 0.2–1.2)
Total Protein: 7.1 g/dL (ref 6.0–8.3)

## 2014-11-29 LAB — BASIC METABOLIC PANEL
BUN: 10 mg/dL (ref 6–23)
CO2: 32 mEq/L (ref 19–32)
Calcium: 9.8 mg/dL (ref 8.4–10.5)
Chloride: 97 mEq/L (ref 96–112)
Creatinine, Ser: 0.65 mg/dL (ref 0.40–1.20)
GFR: 95.31 mL/min (ref >60.00)
Glucose, Bld: 92 mg/dL (ref 70–99)
Potassium: 3.5 mEq/L (ref 3.5–5.1)
Sodium: 135 mEq/L (ref 135–145)

## 2014-11-29 LAB — LIPID PANEL
Cholesterol: 196 mg/dL (ref 0–200)
HDL: 57.9 mg/dL (ref >39.00)
LDL Cholesterol: 124 mg/dL — ABNORMAL HIGH (ref 0–99)
NonHDL: 138.1
Total CHOL/HDL Ratio: 3
Triglycerides: 72 mg/dL (ref 0.0–149.0)
VLDL: 14.4 mg/dL (ref 0.0–40.0)

## 2014-11-29 MED ORDER — LOSARTAN POTASSIUM-HCTZ 100-12.5 MG PO TABS: ORAL_TABLET | ORAL | Status: DC

## 2014-11-29 NOTE — Addendum Note (Signed)
Addended by: Burtis Junes on: 11/29/2014 10:04 AM   Modules accepted: SmartSet

## 2014-11-29 NOTE — Patient Instructions (Addendum)
We will be checking the following labs today - BMET, lipids and HPF   Medication Instructions:    Continue with your current medicines.   I have sent in your refills today   Testing/Procedures To Be Arranged:  N/A  Follow-Up:   See me in one year with fasting labs    Other Special Instructions:   N/A  Call the [place] office at [phone] if you have any questions, problems or concerns.

## 2014-11-29 NOTE — Progress Notes (Addendum)
CARDIOLOGY OFFICE NOTE  Date:  11/29/2014    Deborah Lowe Date of Birth: 03/09/1941 Medical Record #[id number]  PCP:  Deborah Gainer, MD  Cardiologist:  Deborah Lowe    Chief Complaint  Patient presents with  . Hypertension    1 year check - seen for Dr. Acie Lowe    History of Present Illness: Deborah Lowe is a 74 y.o. female who presents today for a one year check. Seen for Dr. Acie Lowe - former patient of Dr. Susa Lowe. She has had prior breast cancer, HTN, palpitations and has normal coronaries and renal arteries from a 2008 evaluation  Seen a year ago - was doing well.  Comes back today. Here alone. She is doing well. No medicines yet today due to being fasting. Her BP diary from home looks good. Her brother died earlier this year - had bone cancer and lasted just 6 months. She is walking several times a week. Has no real complaint. No palpitations. No chest pain. Energy is good. Not short of breath.    Past Medical History  Diagnosis Date  . Hypertension   . Wears glasses   . Ringing in ears   . Palpitations     on beta blocker therapy  . Breast cancer 2012    s/p lumpectomy of left breast with XRT    Past Surgical History  Procedure Laterality Date  . Cholecystectomy    . Tear duct probing    . Cardiac catheterization  01/29/2007  . Cardiovascular stress test  07/19/2002    EF 84%     Medications: Current Outpatient Prescriptions  Medication Sig Dispense Refill  . anastrozole (ARIMIDEX) 1 MG tablet Take 1 mg by mouth.    . losartan-hydrochlorothiazide (HYZAAR) 100-12.5 MG per tablet TAKE 1 TABLET BY MOUTH DAILY. 90 tablet 3  . metoprolol succinate (TOPROL-XL) 25 MG 24 hr tablet Take 1 tablet (25 mg total) by mouth daily. 90 tablet 3  . risedronate (ACTONEL) 150 MG tablet Take 1 tablet (150 mg total) by mouth every 30 (thirty) days. with water on empty stomach, nothing by mouth or lie down for next 30 minutes. 6 tablet 1   No current facility-administered  medications for this visit.    Allergies: Allergies  Allergen Reactions  . Avelox [Moxifloxacin Hcl In Nacl] Anaphylaxis    Social History: The patient  reports that she has never smoked. She has never used smokeless tobacco. She reports that she does not drink alcohol or use illicit drugs.   Family History: The patient's family history includes Cancer in her mother; Heart disease in her father.   Review of Systems: Please see the history of present illness.   Otherwise, the review of systems is positive for none.   All other systems are reviewed and negative.   Physical Exam: VS:  BP 140/90 mmHg  Pulse 52  Ht 5\' 2"  (1.575 m)  Wt 117 lb 6.4 oz (53.252 kg)  BMI 21.47 kg/m2 .  BMI Body mass index is 21.47 kg/(m^2).  Wt Readings from Last 3 Encounters:  11/29/14 117 lb 6.4 oz (53.252 kg)  11/16/14 117 lb 11.2 oz (53.388 kg)  08/04/14 118 lb 12.8 oz (53.887 kg)    General: Pleasant. Well developed, well nourished and in no acute distress.  HEENT: Normal. Neck: Supple, no JVD, carotid bruits, or masses noted.  Cardiac: Regular rate and rhythm. No murmurs, rubs, or gallops. No edema.  Respiratory:  Lungs are clear to auscultation bilaterally with  normal work of breathing.  GI: Soft and nontender.  MS: No deformity or atrophy. Gait and ROM intact. Skin: Warm and dry. Color is normal.  Neuro:  Strength and sensation are intact and no gross focal deficits noted.  Psych: Alert, appropriate and with normal affect.   LABORATORY DATA:  EKG:  EKG is ordered today. This demonstrates NSR.  Lab Results  Component Value Date   WBC 4.8 08/04/2014   HGB 13.6 08/04/2014   HCT 40.2 08/04/2014   PLT 245 08/04/2014   GLUCOSE 94 08/04/2014   CHOL 211* 12/09/2013   TRIG 64.0 12/09/2013   HDL 63.30 12/09/2013   LDLDIRECT 140.5 12/07/2012   LDLCALC 135* 12/09/2013   ALT 13 08/04/2014   AST 13 08/04/2014   NA 137 08/04/2014   K 4.1 08/04/2014   CL 95* 12/09/2013   CREATININE 0.7  08/04/2014   BUN 11.8 08/04/2014   CO2 30* 08/04/2014   TSH 2.460 12/23/2012   HGBA1C 5.6 05/30/2013    BNP (last 3 results) No results for input(s): BNP in the last 8760 hours.  ProBNP (last 3 results) No results for input(s): PROBNP in the last 8760 hours.   Other Studies Reviewed Today:   Assessment/Plan: 1. HTN - great BP control as outpatient. I have refilled her medicines. She will continue to monitor at home. See again in one year.  2. Palpitations - pretty much quiescent.  Current medicines are reviewed with the patient today.  The patient does not have concerns regarding medicines other than what has been noted above.  The following changes have been made:  See above.  Labs/ tests ordered today include:    Orders Placed This Encounter  Procedures  . Basic metabolic panel  . Hepatic function panel  . Lipid panel  . EKG 12-Lead     Disposition:   FU with me in 1 year.   Patient is agreeable to this plan and will call if any problems develop in the interim.   Signed: Burtis Junes, RN, ANP-C 11/29/2014 10:01 AM  [facility] [address] [place], [place]  [address] Phone: [phone] Fax: [phone]

## 2014-12-11 ENCOUNTER — Ambulatory Visit: Payer: Medicare Other | Admitting: Nurse Practitioner

## 2014-12-11 ENCOUNTER — Other Ambulatory Visit: Payer: Self-pay | Admitting: Nurse Practitioner

## 2015-02-06 ENCOUNTER — Telehealth: Payer: Self-pay

## 2015-02-06 NOTE — Telephone Encounter (Signed)
Faxed order for mammogram to [facility].  Sent to scan.

## 2015-02-19 DIAGNOSIS — R921 Mammographic calcification found on diagnostic imaging of breast: Secondary | ICD-10-CM | POA: Diagnosis not present

## 2015-02-19 DIAGNOSIS — Z853 Personal history of malignant neoplasm of breast: Secondary | ICD-10-CM | POA: Diagnosis not present

## 2015-02-20 ENCOUNTER — Telehealth: Payer: Self-pay | Admitting: *Deleted

## 2015-02-20 NOTE — Telephone Encounter (Signed)
Received mammo report from [facility], sent to scan. 

## 2015-03-05 ENCOUNTER — Encounter: Payer: Self-pay | Admitting: *Deleted

## 2015-03-05 NOTE — Progress Notes (Signed)
Received biopsy report from [facility], sent to scan.

## 2015-03-10 ENCOUNTER — Other Ambulatory Visit: Payer: Self-pay | Admitting: Hematology and Oncology

## 2015-03-12 NOTE — Telephone Encounter (Signed)
Chart reviewed.

## 2015-03-19 ENCOUNTER — Ambulatory Visit (INDEPENDENT_AMBULATORY_CARE_PROVIDER_SITE_OTHER): Payer: Medicare Other | Admitting: *Deleted

## 2015-03-19 DIAGNOSIS — Z23 Encounter for immunization: Secondary | ICD-10-CM

## 2015-04-02 ENCOUNTER — Encounter: Payer: Self-pay | Admitting: Family Medicine

## 2015-04-02 ENCOUNTER — Ambulatory Visit (INDEPENDENT_AMBULATORY_CARE_PROVIDER_SITE_OTHER): Payer: Medicare Other | Admitting: Family Medicine

## 2015-04-02 ENCOUNTER — Ambulatory Visit: Payer: Medicare Other | Admitting: Family Medicine

## 2015-04-02 VITALS — BP 137/78 | HR 56 | Temp 97.1°F | Ht 62.0 in | Wt 120.2 lb

## 2015-04-02 DIAGNOSIS — J019 Acute sinusitis, unspecified: Secondary | ICD-10-CM | POA: Diagnosis not present

## 2015-04-02 MED ORDER — FLUTICASONE PROPIONATE 50 MCG/ACT NA SUSP: 2.0000 | Freq: Every day | NASAL | Status: DC

## 2015-04-02 MED ORDER — LORATADINE 10 MG PO TBDP: 10.0000 mg | ORAL_TABLET | Freq: Every day | ORAL | Status: DC

## 2015-04-02 NOTE — Progress Notes (Signed)
BP 137/78 mmHg  Pulse 56  Temp(Src) 97.1 F (36.2 C) (Oral)  Ht 5\' 2"  (1.575 m)  Wt 120 lb 3.2 oz (54.522 kg)  BMI 21.98 kg/m2   Subjective:    Patient ID: Deborah Lowe, female    DOB: March 12, 1941, 74 y.o.   MRN: [id number]  HPI: Deborah Lowe is a 74 y.o. female presenting on 04/02/2015 for Sinusitis   HPI Sinus pressure and postnasal drainage Patient has a three-day history of sinus pressure and nasal congestion and postnasal drainage and a scratchy sore throat. She denies any fevers or chills or difficulty breathing. She has a little bit of a cough that is nonproductive. Her nasal drainage is white to clear. Her sinus pressure as well as in the maxillary and frontal region. She's tried over-the-counter ibuprofen but nothing else at this point.  Relevant past medical, surgical, family and social history reviewed and updated as indicated. Interim medical history since our last visit reviewed. Allergies and medications reviewed and updated.  Review of Systems  Constitutional: Negative for fever and chills.  HENT: Positive for congestion, postnasal drip, rhinorrhea, sinus pressure and sore throat. Negative for ear discharge, ear pain and sneezing.   Eyes: Negative for pain, redness and visual disturbance.  Respiratory: Positive for cough. Negative for chest tightness and shortness of breath.   Cardiovascular: Negative for chest pain and leg swelling.  Genitourinary: Negative for dysuria and difficulty urinating.  Musculoskeletal: Negative for back pain and gait problem.  Skin: Negative for rash.  Neurological: Negative for light-headedness and headaches.  Psychiatric/Behavioral: Negative for behavioral problems and agitation.  All other systems reviewed and are negative.   Per HPI unless specifically indicated above     Medication List       This list is accurate as of: 04/02/15 10:06 AM.  Always use your most recent med list.               anastrozole 1 MG tablet    Commonly known as:  ARIMIDEX  TAKE 1 TABLET (1 MG TOTAL) BY MOUTH DAILY.     fluticasone 50 MCG/ACT nasal spray  Commonly known as:  FLONASE  Place 2 sprays into both nostrils daily.     Investigational vitamin D 600 UNITS capsule SWOG S0812  Take 600 Units by mouth daily. Take with food.     loratadine 10 MG dissolvable tablet  Commonly known as:  ALLERGY RELIEF  Take 1 tablet (10 mg total) by mouth daily.     losartan-hydrochlorothiazide 100-12.5 MG tablet  Commonly known as:  HYZAAR  TAKE 1 TABLET BY MOUTH DAILY.     risedronate 150 MG tablet  Commonly known as:  ACTONEL  Take 1 tablet (150 mg total) by mouth every 30 (thirty) days. with water on empty stomach, nothing by mouth or lie down for next 30 minutes.     TOPROL XL 25 MG 24 hr tablet  Generic drug:  metoprolol succinate  TAKE 1 TABLET (25 MG TOTAL) BY MOUTH DAILY.           Objective:    BP 137/78 mmHg  Pulse 56  Temp(Src) 97.1 F (36.2 C) (Oral)  Ht 5\' 2"  (1.575 m)  Wt 120 lb 3.2 oz (54.522 kg)  BMI 21.98 kg/m2  Wt Readings from Last 3 Encounters:  04/02/15 120 lb 3.2 oz (54.522 kg)  11/29/14 117 lb 6.4 oz (53.252 kg)  11/16/14 117 lb 11.2 oz (53.388 kg)    Physical Exam  Constitutional: She is oriented to person, place, and time. She appears well-developed and well-nourished. No distress.  HENT:  Right Ear: Tympanic membrane, external ear and ear canal normal.  Left Ear: Tympanic membrane, external ear and ear canal normal.  Nose: Mucosal edema and rhinorrhea present. No epistaxis. Right sinus exhibits maxillary sinus tenderness and frontal sinus tenderness. Left sinus exhibits maxillary sinus tenderness and frontal sinus tenderness.  Mouth/Throat: Uvula is midline and mucous membranes are normal. Posterior oropharyngeal edema and posterior oropharyngeal erythema present. No oropharyngeal exudate or tonsillar abscesses.  Eyes: Conjunctivae and EOM are normal.  Cardiovascular: Normal rate, regular  rhythm, normal heart sounds and intact distal pulses.   No murmur heard. Pulmonary/Chest: Effort normal and breath sounds normal. No respiratory distress. She has no wheezes.  Musculoskeletal: Normal range of motion. She exhibits no edema or tenderness.  Neurological: She is alert and oriented to person, place, and time. Coordination normal.  Skin: Skin is warm and dry. No rash noted. She is not diaphoretic.  Psychiatric: She has a normal mood and affect. Her behavior is normal.  Nursing note and vitals reviewed.   Results for orders placed or performed in visit on 37/16/96  Basic metabolic panel  Result Value Ref Range   Sodium 135 135 - 145 mEq/L   Potassium 3.5 3.5 - 5.1 mEq/L   Chloride 97 96 - 112 mEq/L   CO2 32 19 - 32 mEq/L   Glucose, Bld 92 70 - 99 mg/dL   BUN 10 6 - 23 mg/dL   Creatinine, Ser 0.65 0.40 - 1.20 mg/dL   Calcium 9.8 8.4 - 10.5 mg/dL   GFR 95.31 >60.00 mL/min  Hepatic function panel  Result Value Ref Range   Total Bilirubin 0.9 0.2 - 1.2 mg/dL   Bilirubin, Direct 0.1 0.0 - 0.3 mg/dL   Alkaline Phosphatase 71 39 - 117 U/L   AST 12 0 - 37 U/L   ALT 12 0 - 35 U/L   Total Protein 7.1 6.0 - 8.3 g/dL   Albumin 4.1 3.5 - 5.2 g/dL  Lipid panel  Result Value Ref Range   Cholesterol 196 0 - 200 mg/dL   Triglycerides 72.0 0.0 - 149.0 mg/dL   HDL 57.90 >39.00 mg/dL   VLDL 14.4 0.0 - 40.0 mg/dL   LDL Cholesterol 124 (H) 0 - 99 mg/dL   Total CHOL/HDL Ratio 3    NonHDL 138.10       Assessment & Plan:   Problem List Items Addressed This Visit    None    Visit Diagnoses    Acute rhinosinusitis    -  Primary    Allergic versus viral, will try treating with Flonase and Claritin    Relevant Medications    fluticasone (FLONASE) 50 MCG/ACT nasal spray    loratadine (ALLERGY RELIEF) 10 MG dissolvable tablet        Follow up plan: Return in about 4 weeks (around 04/30/2015), or if symptoms worsen or fail to improve, for Hypertension recheck.  Caryl Pina,  MD [facility] 04/02/2015, 10:06 AM

## 2015-05-17 ENCOUNTER — Ambulatory Visit (HOSPITAL_BASED_OUTPATIENT_CLINIC_OR_DEPARTMENT_OTHER): Payer: Medicare Other | Admitting: Hematology and Oncology

## 2015-05-17 ENCOUNTER — Other Ambulatory Visit (HOSPITAL_BASED_OUTPATIENT_CLINIC_OR_DEPARTMENT_OTHER): Payer: Medicare Other

## 2015-05-17 ENCOUNTER — Telehealth: Payer: Self-pay | Admitting: Hematology and Oncology

## 2015-05-17 ENCOUNTER — Encounter: Payer: Self-pay | Admitting: Hematology and Oncology

## 2015-05-17 VITALS — BP 149/51 | HR 64 | Temp 97.8°F | Resp 17 | Wt 118.3 lb

## 2015-05-17 DIAGNOSIS — M810 Age-related osteoporosis without current pathological fracture: Secondary | ICD-10-CM | POA: Diagnosis not present

## 2015-05-17 DIAGNOSIS — C50412 Malignant neoplasm of upper-outer quadrant of left female breast: Secondary | ICD-10-CM | POA: Diagnosis not present

## 2015-05-17 LAB — CBC WITH DIFFERENTIAL/PLATELET
BASO%: 0.4 % (ref 0.0–2.0)
Basophils Absolute: 0 10*3/uL (ref 0.0–0.1)
EOS%: 0.7 % (ref 0.0–7.0)
Eosinophils Absolute: 0.1 10*3/uL (ref 0.0–0.5)
HCT: 39.4 % (ref 34.8–46.6)
HGB: 13.5 g/dL (ref 11.6–15.9)
LYMPH#: 1.9 10*3/uL (ref 0.9–3.3)
LYMPH%: 28.6 % (ref 14.0–49.7)
MCH: 29.3 pg (ref 25.1–34.0)
MCHC: 34.3 g/dL (ref 31.5–36.0)
MCV: 85.7 fL (ref 79.5–101.0)
MONO#: 0.6 10*3/uL (ref 0.1–0.9)
MONO%: 8.5 % (ref 0.0–14.0)
NEUT#: 4.1 10*3/uL (ref 1.5–6.5)
NEUT%: 61.8 % (ref 38.4–76.8)
PLATELETS: 245 10*3/uL (ref 145–400)
RBC: 4.6 10*6/uL (ref 3.70–5.45)
RDW: 12.7 % (ref 11.2–14.5)
WBC: 6.7 10*3/uL (ref 3.9–10.3)

## 2015-05-17 LAB — COMPREHENSIVE METABOLIC PANEL
ALT: 11 U/L (ref 0–55)
AST: 12 U/L (ref 5–34)
Albumin: 4 g/dL (ref 3.5–5.0)
Alkaline Phosphatase: 71 U/L (ref 40–150)
Anion Gap: 9 mEq/L (ref 3–11)
BUN: 9.9 mg/dL (ref 7.0–26.0)
CHLORIDE: 96 meq/L — AB (ref 98–109)
CO2: 28 mEq/L (ref 22–29)
Calcium: 9.5 mg/dL (ref 8.4–10.4)
Creatinine: 0.8 mg/dL (ref 0.6–1.1)
EGFR: 74 mL/min/{1.73_m2} — ABNORMAL LOW (ref >90)
GLUCOSE: 108 mg/dL (ref 70–140)
POTASSIUM: 3.6 meq/L (ref 3.5–5.1)
SODIUM: 133 meq/L — AB (ref 136–145)
Total Bilirubin: 0.91 mg/dL (ref 0.20–1.20)
Total Protein: 7 g/dL (ref 6.4–8.3)

## 2015-05-17 NOTE — Progress Notes (Signed)
Patient Care Team: Chipper Herb, MD as PCP - General (Family Medicine)  DIAGNOSIS: Carcinoma of upper-outer quadrant of left female breast [facility])   Staging form: Breast, AJCC 7th Edition     Clinical: No stage assigned - Unsigned     Pathologic: Stage IA (T1c, N0, cM0) - Signed by Rulon Eisenmenger, MD on 02/03/2014   SUMMARY OF ONCOLOGIC HISTORY:   Carcinoma of upper-outer quadrant of left female breast ([place])   12/25/2010 Surgery Left breast lumpectomy invasive ductal carcinoma with high-grade DCIS ER 80% PR 10% Ki-67 60% T1 a. N0 M0 stage IA (IDC measured 0.1 cm)   02/04/2011 - 03/06/2011 Radiation Therapy Radiation therapy to lumpectomy site by Dr. Valere Dross   04/26/2011 -  Anti-estrogen oral therapy Arimidex 1 mg daily. Plan is to treat her for 5 years    CHIEF COMPLIANT: follow-up of left breast cancer  INTERVAL HISTORY: Deborah Lowe is a 74 year old with above-mentioned history of left breast cancer treated with lumpectomy radiation is currently on Arimidex. She has taken it for the past 4 years. She appears to be tolerating it extremely well. She was found to have severe osteoporosis with a T score of -3.5 and we started her on Boniva. She appears to be tolerating that extremely well. She denies any lumps or nodules in the breast. She had a mammogram in August at [facility] which showed calcifications and she will have another mammogram in March 2017 on the right breast.  REVIEW OF SYSTEMS:   Constitutional: Denies fevers, chills or abnormal weight loss Eyes: Denies blurriness of vision Ears, nose, mouth, throat, and face: Denies mucositis or sore throat Respiratory: Denies cough, dyspnea or wheezes Cardiovascular: Denies palpitation, chest discomfort Gastrointestinal:  Denies nausea, heartburn or change in bowel habits Skin: Denies abnormal skin rashes Lymphatics: Denies new lymphadenopathy or easy bruising Neurological:Denies numbness, tingling or new weaknesses Behavioral/Psych: Mood  is stable, no new changes  Extremities: No lower extremity edema Breast:  denies any pain or lumps or nodules in either breasts All other systems were reviewed with the patient and are negative.  I have reviewed the past medical history, past surgical history, social history and family history with the patient and they are unchanged from previous note.  ALLERGIES:  is allergic to avelox.  MEDICATIONS:  Current Outpatient Prescriptions  Medication Sig Dispense Refill  . anastrozole (ARIMIDEX) 1 MG tablet TAKE 1 TABLET (1 MG TOTAL) BY MOUTH DAILY. 30 tablet 2  . Investigational vitamin D 600 UNITS capsule SWOG S0812 Take 600 Units by mouth daily. Take with food.    Marland Kitchen losartan-hydrochlorothiazide (HYZAAR) 100-12.5 MG per tablet TAKE 1 TABLET BY MOUTH DAILY. 90 tablet 3  . risedronate (ACTONEL) 150 MG tablet Take 1 tablet (150 mg total) by mouth every 30 (thirty) days. with water on empty stomach, nothing by mouth or lie down for next 30 minutes. 6 tablet 1  . TOPROL XL 25 MG 24 hr tablet TAKE 1 TABLET (25 MG TOTAL) BY MOUTH DAILY. 90 tablet 3   No current facility-administered medications for this visit.    PHYSICAL EXAMINATION: ECOG PERFORMANCE STATUS: 0 - Asymptomatic  Filed Vitals:   05/17/15 1110  BP: 149/51  Pulse: 64  Temp: 97.8 F (36.6 C)  Resp: 17   Filed Weights   05/17/15 1110  Weight: 118 lb 4.8 oz (53.661 kg)    GENERAL:alert, no distress and comfortable SKIN: skin color, texture, turgor are normal, no rashes or significant lesions EYES: normal, Conjunctiva  are pink and non-injected, sclera clear OROPHARYNX:no exudate, no erythema and lips, buccal mucosa, and tongue normal  NECK: supple, thyroid normal size, non-tender, without nodularity LYMPH:  no palpable lymphadenopathy in the cervical, axillary or inguinal LUNGS: clear to auscultation and percussion with normal breathing effort HEART: regular rate & rhythm and no murmurs and no lower extremity  edema ABDOMEN:abdomen soft, non-tender and normal bowel sounds MUSCULOSKELETAL:no cyanosis of digits and no clubbing  NEURO: alert & oriented x 3 with fluent speech, no focal motor/sensory deficits EXTREMITIES: No lower extremity edema BREAST: No palpable masses or nodules in either right or left breasts. No palpable axillary supraclavicular or infraclavicular adenopathy no breast tenderness or nipple discharge. (exam performed in the presence of a chaperone)  LABORATORY DATA:  I have reviewed the data as listed   Chemistry      Component Value Date/Time   NA 133* 05/17/2015 1107   NA 135 11/29/2014 1006   K 3.6 05/17/2015 1107   K 3.5 11/29/2014 1006   CL 97 11/29/2014 1006   CL 97* 08/04/2012 1010   CO2 28 05/17/2015 1107   CO2 32 11/29/2014 1006   BUN 9.9 05/17/2015 1107   BUN 10 11/29/2014 1006   CREATININE 0.8 05/17/2015 1107   CREATININE 0.65 11/29/2014 1006      Component Value Date/Time   CALCIUM 9.5 05/17/2015 1107   CALCIUM 9.8 11/29/2014 1006   ALKPHOS 71 05/17/2015 1107   ALKPHOS 71 11/29/2014 1006   AST 12 05/17/2015 1107   AST 12 11/29/2014 1006   ALT 11 05/17/2015 1107   ALT 12 11/29/2014 1006   BILITOT 0.91 05/17/2015 1107   BILITOT 0.9 11/29/2014 1006       Lab Results  Component Value Date   WBC 6.7 05/17/2015   HGB 13.5 05/17/2015   HCT 39.4 05/17/2015   MCV 85.7 05/17/2015   PLT 245 05/17/2015   NEUTROABS 4.1 05/17/2015     ASSESSMENT & PLAN:  Carcinoma of upper-outer quadrant of left female breast Left breast cancer status post lumpectomy, invasive ductal carcinoma ER 80% PR 10% Ki-67 is 60% T1 C. N0 M0 stage IA status post radiation therapy and currently on Arimidex since December 2012   Arimidex toxicities: 1. Bone density 02/17/2014 revealed a T score of -3.4 which is profound osteoporosis Otherwise patient denies any hot flashes or myalgias.  Osteoporosis management: currently takes Boniva once a month. She appears to be tolerating  it extremely well.  Breast cancer surveillance: 1. Breast exam 12/22//2016 is normal 2. Mammogram 02/17/2014 is normal  Mammogram done at [facility] revealed benign-appearing calcifications in the right breast and the recommended as six-month follow-up. This she will have in March 2017.  RTC  In one year and that would conclude 5 years of antiestrogen therapy.   No orders of the defined types were placed in this encounter.   The patient has a good understanding of the overall plan. she agrees with it. she will call with any problems that may develop before the next visit here.   Rulon Eisenmenger, MD 05/17/2015

## 2015-05-17 NOTE — Assessment & Plan Note (Signed)
Left breast cancer status post lumpectomy, invasive ductal carcinoma ER 80% PR 10% Ki-67 is 60% T1 C. N0 M0 stage IA status post radiation therapy and currently on Arimidex since December 2012   Arimidex toxicities: 1. Bone density 02/17/2014 revealed a T score of -3.4 which is profound osteoporosis  Osteoporosis management: Start risedronate 150 mg along with her current supplements of calcium and vitamin D. I discussed with her about osteonecrosis of the jaw risk as well as to not lie down after taking this pill and to take it with a large glass of water  We discussed about switching from anastrozole to tamoxifen but patient is very reluctant to make any switched because she is afraid that tamoxifen will cause more side effects. She would like to remain on anastrozole.  Breast cancer surveillance: 1. Breast exam 12/22//2016 is normal 2. Mammogram 02/17/2014 is normal  Mammogram and bone density are pending  RTC  In one year

## 2015-05-17 NOTE — Telephone Encounter (Signed)
appoitments made and avs printed for patient

## 2015-05-18 ENCOUNTER — Telehealth: Payer: Self-pay | Admitting: *Deleted

## 2015-05-18 NOTE — Telephone Encounter (Signed)
"  I saw Dr. Lindi Adie yesterday and did not discuss the lab work.  Is everything okay?"  Advised lab results.  Sodium level a little low.  Encouraged limiting drinking fluids in excess taking in 64 oz minimum.  No further questions.

## 2015-06-09 ENCOUNTER — Other Ambulatory Visit: Payer: Self-pay | Admitting: Hematology and Oncology

## 2015-06-11 ENCOUNTER — Other Ambulatory Visit: Payer: Self-pay | Admitting: *Deleted

## 2015-06-11 DIAGNOSIS — C50412 Malignant neoplasm of upper-outer quadrant of left female breast: Secondary | ICD-10-CM

## 2015-06-11 MED ORDER — ANASTROZOLE 1 MG PO TABS: ORAL_TABLET | ORAL | Status: DC

## 2015-07-03 DIAGNOSIS — B308 Other viral conjunctivitis: Secondary | ICD-10-CM | POA: Diagnosis not present

## 2015-07-30 DIAGNOSIS — H6120 Impacted cerumen, unspecified ear: Secondary | ICD-10-CM | POA: Insufficient documentation

## 2015-07-30 DIAGNOSIS — E079 Disorder of thyroid, unspecified: Secondary | ICD-10-CM | POA: Insufficient documentation

## 2015-07-30 DIAGNOSIS — E0789 Other specified disorders of thyroid: Secondary | ICD-10-CM | POA: Diagnosis not present

## 2015-07-30 DIAGNOSIS — H6121 Impacted cerumen, right ear: Secondary | ICD-10-CM | POA: Diagnosis not present

## 2015-07-31 DIAGNOSIS — R921 Mammographic calcification found on diagnostic imaging of breast: Secondary | ICD-10-CM | POA: Diagnosis not present

## 2015-08-07 ENCOUNTER — Other Ambulatory Visit: Payer: Self-pay | Admitting: Otolaryngology

## 2015-08-07 DIAGNOSIS — E079 Disorder of thyroid, unspecified: Secondary | ICD-10-CM

## 2015-08-10 ENCOUNTER — Ambulatory Visit
Admission: RE | Admit: 2015-08-10 | Discharge: 2015-08-10 | Disposition: A | Discharge status: Home / Self Care | Payer: Medicare Other | Source: Ambulatory Visit | Attending: Otolaryngology | Admitting: Otolaryngology

## 2015-08-10 DIAGNOSIS — E042 Nontoxic multinodular goiter: Secondary | ICD-10-CM | POA: Diagnosis not present

## 2015-08-10 DIAGNOSIS — E079 Disorder of thyroid, unspecified: Secondary | ICD-10-CM

## 2015-08-22 DIAGNOSIS — L821 Other seborrheic keratosis: Secondary | ICD-10-CM | POA: Diagnosis not present

## 2015-08-22 DIAGNOSIS — L820 Inflamed seborrheic keratosis: Secondary | ICD-10-CM | POA: Diagnosis not present

## 2015-08-22 DIAGNOSIS — L219 Seborrheic dermatitis, unspecified: Secondary | ICD-10-CM | POA: Diagnosis not present

## 2015-08-22 DIAGNOSIS — L578 Other skin changes due to chronic exposure to nonionizing radiation: Secondary | ICD-10-CM | POA: Diagnosis not present

## 2015-08-28 DIAGNOSIS — Z6821 Body mass index (BMI) 21.0-21.9, adult: Secondary | ICD-10-CM | POA: Diagnosis not present

## 2015-08-28 DIAGNOSIS — Z124 Encounter for screening for malignant neoplasm of cervix: Secondary | ICD-10-CM | POA: Diagnosis not present

## 2015-09-20 ENCOUNTER — Ambulatory Visit: Payer: Medicare Other | Admitting: Family Medicine

## 2015-09-21 ENCOUNTER — Ambulatory Visit (INDEPENDENT_AMBULATORY_CARE_PROVIDER_SITE_OTHER): Payer: Medicare Other | Admitting: Family Medicine

## 2015-09-21 ENCOUNTER — Encounter: Payer: Self-pay | Admitting: Family Medicine

## 2015-09-21 DIAGNOSIS — L309 Dermatitis, unspecified: Secondary | ICD-10-CM | POA: Diagnosis not present

## 2015-09-21 MED ORDER — BETAMETHASONE SOD PHOS & ACET 6 (3-3) MG/ML IJ SUSP
6.0000 mg | Freq: Once | INTRAMUSCULAR | Status: AC
  Administered 2015-09-21: 6 mg via INTRAMUSCULAR

## 2015-09-21 NOTE — Progress Notes (Signed)
Subjective:  Patient ID: Deborah Lowe, female    DOB: 11-25-1940  Age: 75 y.o. MRN: [id number]  CC: itchy scalp   HPI Deborah Lowe presents for mild itching of occipital scaalp just above the hairline. ONgoing for 2 weeks. Not spreading, not painful, not worsening, but not improving.  History Deborah Lowe has a past medical history of Hypertension; Wears glasses; Ringing in ears; Palpitations; and Breast cancer ([place]) (2012).   She has past surgical history that includes Cholecystectomy; Tear duct probing; Cardiac catheterization (01/29/2007); Cardiovascular stress test (07/19/2002); Breast surgery; and Breast lumpectomy (Left).   Her family history includes Cancer in her mother; Heart disease in her father.She reports that she has never smoked. She has never used smokeless tobacco. She reports that she does not drink alcohol or use illicit drugs.  Current Outpatient Prescriptions on File Prior to Visit  Medication Sig Dispense Refill  . anastrozole (ARIMIDEX) 1 MG tablet TAKE 1 TABLET (1 MG TOTAL) BY MOUTH DAILY. 90 tablet 3  . Investigational vitamin D 600 UNITS capsule SWOG S0812 Take 600 Units by mouth daily. Take with food.    Marland Kitchen losartan-hydrochlorothiazide (HYZAAR) 100-12.5 MG per tablet TAKE 1 TABLET BY MOUTH DAILY. 90 tablet 3  . risedronate (ACTONEL) 150 MG tablet Take 1 tablet (150 mg total) by mouth every 30 (thirty) days. with water on empty stomach, nothing by mouth or lie down for next 30 minutes. 6 tablet 1  . TOPROL XL 25 MG 24 hr tablet TAKE 1 TABLET (25 MG TOTAL) BY MOUTH DAILY. 90 tablet 3   No current facility-administered medications on file prior to visit.    ROS Review of Systems  Constitutional: Negative for fever, activity change and appetite change.  HENT: Negative for congestion, rhinorrhea and sore throat.   Eyes: Negative for visual disturbance.  Respiratory: Negative for cough and shortness of breath.   Cardiovascular: Negative for chest pain and  palpitations.  Gastrointestinal: Negative for nausea, abdominal pain and diarrhea.  Genitourinary: Negative for dysuria.  Musculoskeletal: Negative for myalgias and arthralgias.    Objective:  BP 153/71 mmHg  Pulse 62  Temp(Src) 97.6 F (36.4 C) (Oral)  Ht 5\' 2"  (1.575 m)  Wt 120 lb 3.2 oz (54.522 kg)  BMI 21.98 kg/m2  SpO2 100%  Physical Exam  Constitutional: She is oriented to person, place, and time. She appears well-developed and well-nourished. No distress.  HENT:  Head: Normocephalic and atraumatic.  Eyes: Conjunctivae are normal. Pupils are equal, round, and reactive to light.  Neck: Normal range of motion. Neck supple. No thyromegaly present.  Cardiovascular: Normal rate, regular rhythm and normal heart sounds.   No murmur heard. Pulmonary/Chest: Effort normal and breath sounds normal. No respiratory distress. She has no wheezes. She has no rales.  Abdominal: Soft. Bowel sounds are normal. She exhibits no distension. There is no tenderness.  Musculoskeletal: Normal range of motion.  Lymphadenopathy:    She has no cervical adenopathy.  Neurological: She is alert and oriented to person, place, and time.  Skin: Skin is warm and dry. There is erythema (mild at posterior scalp No scale. No pustulence. Not raised.).  Psychiatric: She has a normal mood and affect. Her behavior is normal. Thought content normal.    Assessment & Plan:   Deborah Lowe was seen today for itchy scalp.  Diagnoses and all orders for this visit:  Eczematous dermatitis -     betamethasone acetate-betamethasone sodium phosphate (CELESTONE) injection 6 mg; Inject 1 mL (6 mg total)  into the muscle once.   I am having Deborah Lowe maintain her risedronate, losartan-hydrochlorothiazide, TOPROL XL, Investigational vitamin D, and anastrozole. We will continue to administer betamethasone acetate-betamethasone sodium phosphate.  Meds ordered this encounter  Medications  . betamethasone acetate-betamethasone  sodium phosphate (CELESTONE) injection 6 mg    Sig:      Follow-up: No Follow-up on file.  Claretta Fraise, M.D.

## 2015-09-24 ENCOUNTER — Ambulatory Visit: Payer: Medicare Other | Admitting: Family Medicine

## 2015-12-05 ENCOUNTER — Encounter: Payer: Self-pay | Admitting: Nurse Practitioner

## 2015-12-05 ENCOUNTER — Ambulatory Visit (INDEPENDENT_AMBULATORY_CARE_PROVIDER_SITE_OTHER): Payer: Medicare Other | Admitting: Nurse Practitioner

## 2015-12-05 VITALS — BP 160/90 | HR 55 | Ht 62.0 in | Wt 117.4 lb

## 2015-12-05 DIAGNOSIS — R002 Palpitations: Secondary | ICD-10-CM | POA: Diagnosis not present

## 2015-12-05 DIAGNOSIS — I10 Essential (primary) hypertension: Secondary | ICD-10-CM | POA: Diagnosis not present

## 2015-12-05 DIAGNOSIS — E785 Hyperlipidemia, unspecified: Secondary | ICD-10-CM

## 2015-12-05 LAB — LIPID PANEL
Cholesterol: 208 mg/dL — ABNORMAL HIGH (ref 125–200)
HDL: 66 mg/dL (ref >=46)
LDL Cholesterol: 126 mg/dL (ref <130)
Total CHOL/HDL Ratio: 3.2 Ratio (ref <=5.0)
Triglycerides: 81 mg/dL (ref <150)
VLDL: 16 mg/dL (ref <30)

## 2015-12-05 LAB — HEPATIC FUNCTION PANEL
ALT: 14 U/L (ref 6–29)
AST: 17 U/L (ref 10–35)
Albumin: 4.3 g/dL (ref 3.6–5.1)
Alkaline Phosphatase: 58 U/L (ref 33–130)
Bilirubin, Direct: 0.2 mg/dL (ref <=0.2)
Indirect Bilirubin: 0.6 mg/dL (ref 0.2–1.2)
Total Bilirubin: 0.8 mg/dL (ref 0.2–1.2)
Total Protein: 6.7 g/dL (ref 6.1–8.1)

## 2015-12-05 LAB — BASIC METABOLIC PANEL
BUN: 12 mg/dL (ref 7–25)
CO2: 24 mmol/L (ref 20–31)
Calcium: 9.5 mg/dL (ref 8.6–10.4)
Chloride: 96 mmol/L — ABNORMAL LOW (ref 98–110)
Creat: 0.7 mg/dL (ref 0.60–0.93)
Glucose, Bld: 101 mg/dL — ABNORMAL HIGH (ref 65–99)
Potassium: 5 mmol/L (ref 3.5–5.3)
Sodium: 133 mmol/L — ABNORMAL LOW (ref 135–146)

## 2015-12-05 MED ORDER — TOPROL XL 25 MG PO TB24: ORAL_TABLET | ORAL | Status: DC

## 2015-12-05 MED ORDER — LOSARTAN POTASSIUM-HCTZ 100-12.5 MG PO TABS: ORAL_TABLET | ORAL | Status: DC

## 2015-12-05 NOTE — Progress Notes (Signed)
CARDIOLOGY OFFICE NOTE  Date:  12/05/2015    [name] Date of Birth: 11-Dec-1940 Medical Record [id number]  PCP:  Kenn File, MD  Cardiologist:  [facility]    Chief Complaint  Patient presents with  . Hypertension  . Palpitations    1 year check - seen for Dr. Acie Fredrickson    History of Present Illness: Deborah Lowe is a 75 y.o. female who presents today for a one year check. Seen for Dr. Acie Fredrickson - former patient of Dr. Susa Simmonds.   She has had prior breast cancer, HTN, palpitations and has normal coronaries and renal arteries from a 2008 evaluation  Seen a year ago - was doing well.  Comes back today. Here alone. She has had a good year. BP is good at home. No palpitations. HR a little low at times but totally asymptomatic. Not dizzy or lightheaded. No syncope. No chest pain. She continues to walk almost every day. She is very happy with how she is doing.   Past Medical History  Diagnosis Date  . Hypertension   . Wears glasses   . Ringing in ears   . Palpitations     on beta blocker therapy  . Breast cancer ([place]) 2012    s/p lumpectomy of left breast with XRT    Past Surgical History  Procedure Laterality Date  . Cholecystectomy    . Tear duct probing    . Cardiac catheterization  01/29/2007  . Cardiovascular stress test  07/19/2002    EF 84%  . Breast surgery    . Breast lumpectomy Left      Medications: Current Outpatient Prescriptions  Medication Sig Dispense Refill  . anastrozole (ARIMIDEX) 1 MG tablet TAKE 1 TABLET (1 MG TOTAL) BY MOUTH DAILY. 90 tablet 3  . Investigational vitamin D 600 UNITS capsule SWOG S0812 Take 600 Units by mouth daily. Take with food.    Marland Kitchen losartan-hydrochlorothiazide (HYZAAR) 100-12.5 MG tablet TAKE 1 TABLET BY MOUTH DAILY. 90 tablet 3  . risedronate (ACTONEL) 150 MG tablet Take 1 tablet (150 mg total) by mouth every 30 (thirty) days. with water on empty stomach, nothing by mouth or lie down for next 30 minutes. 6  tablet 1  . TOPROL XL 25 MG 24 hr tablet TAKE 1 TABLET (25 MG TOTAL) BY MOUTH DAILY. 90 tablet 3   No current facility-administered medications for this visit.    Allergies: Allergies  Allergen Reactions  . Avelox [Moxifloxacin Hcl In Nacl] Anaphylaxis    Social History: The patient  reports that she has never smoked. She has never used smokeless tobacco. She reports that she does not drink alcohol or use illicit drugs.   Family History: The patient's family history includes Bone cancer in her brother; Colon cancer in her mother; Heart disease in her father.   Review of Systems: Please see the history of present illness.   Otherwise, the review of systems is positive for none.   All other systems are reviewed and negative.   Physical Exam: VS:  BP 160/90 mmHg  Pulse 55  Ht 5\' 2"  (1.575 m)  Wt 117 lb 6.4 oz (53.252 kg)  BMI 21.47 kg/m2 .  BMI Body mass index is 21.47 kg/(m^2).  Wt Readings from Last 3 Encounters:  12/05/15 117 lb 6.4 oz (53.252 kg)  09/21/15 120 lb 3.2 oz (54.522 kg)  05/17/15 118 lb 4.8 oz (53.661 kg)    General: Pleasant. Thin stature. Alert and in  no acute distress.  HEENT: Normal. Neck: Supple, no JVD, carotid bruits, or masses noted.  Cardiac: Regular rate and rhythm. No murmurs, rubs, or gallops. No edema.  Respiratory:  Lungs are clear to auscultation bilaterally with normal work of breathing.  GI: Soft and nontender.  MS: No deformity or atrophy. Gait and ROM intact. Skin: Warm and dry. Color is normal.  Neuro:  Strength and sensation are intact and no gross focal deficits noted.  Psych: Alert, appropriate and with normal affect.   LABORATORY DATA:  EKG:  EKG is ordered today. This demonstrates sinus bradycardia.  Lab Results  Component Value Date   WBC 6.7 05/17/2015   HGB 13.5 05/17/2015   HCT 39.4 05/17/2015   PLT 245 05/17/2015   GLUCOSE 108 05/17/2015   CHOL 196 11/29/2014   TRIG 72.0 11/29/2014   HDL 57.90 11/29/2014    LDLDIRECT 140.5 12/07/2012   LDLCALC 124* 11/29/2014   ALT 11 05/17/2015   AST 12 05/17/2015   NA 133* 05/17/2015   K 3.6 05/17/2015   CL 97 11/29/2014   CREATININE 0.8 05/17/2015   BUN 9.9 05/17/2015   CO2 28 05/17/2015   TSH 2.460 12/23/2012   HGBA1C 5.6 05/30/2013    BNP (last 3 results) No results for input(s): BNP in the last 8760 hours.  ProBNP (last 3 results) No results for input(s): PROBNP in the last 8760 hours.   Other Studies Reviewed Today:   Assessment/Plan: 1. HTN - great BP control as outpatient. I have refilled her medicines. She will continue to monitor at home. See again in one year.  2. Palpitations - pretty much quiescent.  3. HLD - lab today.   Current medicines are reviewed with the patient today.  The patient does not have concerns regarding medicines other than what has been noted above.  The following changes have been made:  See above.  Labs/ tests ordered today include:    Orders Placed This Encounter  Procedures  . Basic metabolic panel  . Hepatic function panel  . Lipid panel  . EKG 12-Lead     Disposition:   FU with me in 1 year.   Patient is agreeable to this plan and will call if any problems develop in the interim.   Signed: Burtis Junes, RN, ANP-C 12/05/2015 9:47 AM  [facility] [address] [place], [place]  [address] Phone: [phone] Fax: [phone]

## 2015-12-05 NOTE — Patient Instructions (Addendum)
We will be checking the following labs today - BMET, Lipids and HPF   Medication Instructions:    Continue with your current medicines.  I have refilled your medicines today.      Testing/Procedures To Be Arranged:  N/A  Follow-Up:   See me in one year    Other Special Instructions:   Keep up the good work!    If you need a refill on your cardiac medications before your next appointment, please call your pharmacy.   Call the [place] office at [phone] if you have any questions, problems or concerns.

## 2015-12-06 ENCOUNTER — Telehealth: Payer: Self-pay | Admitting: Nurse Practitioner

## 2015-12-06 ENCOUNTER — Other Ambulatory Visit: Payer: Self-pay | Admitting: Nurse Practitioner

## 2015-12-06 NOTE — Telephone Encounter (Signed)
Pt is aware of lab results.

## 2015-12-06 NOTE — Telephone Encounter (Signed)
New message  ° ° ° °Pt is calling for lab results °

## 2015-12-13 ENCOUNTER — Ambulatory Visit (INDEPENDENT_AMBULATORY_CARE_PROVIDER_SITE_OTHER): Payer: Medicare Other | Admitting: Nurse Practitioner

## 2015-12-13 ENCOUNTER — Encounter: Payer: Self-pay | Admitting: Nurse Practitioner

## 2015-12-13 VITALS — BP 140/70 | HR 56 | Temp 96.9°F | Ht 62.0 in | Wt 120.0 lb

## 2015-12-13 DIAGNOSIS — J0101 Acute recurrent maxillary sinusitis: Secondary | ICD-10-CM

## 2015-12-13 MED ORDER — AZITHROMYCIN 250 MG PO TABS: ORAL_TABLET | ORAL | Status: DC

## 2015-12-13 NOTE — Patient Instructions (Signed)

## 2015-12-13 NOTE — Progress Notes (Signed)
  Subjective:     Deborah Lowe is a 75 y.o. female who presents for evaluation of sinus pain. Symptoms include: clear rhinorrhea, congestion, headaches, post nasal drip and sinus pressure. Onset of symptoms was 9 days ago. Symptoms have been gradually worsening since that time. Past history is significant for no history of pneumonia or bronchitis. Patient is a non-smoker.  The following portions of the patient's history were reviewed and updated as appropriate: allergies, current medications, past family history, past medical history, past social history, past surgical history and problem list.  Review of Systems Pertinent items are noted in HPI.   Objective:    BP 140/70 mmHg  Pulse 56  Temp(Src) 96.9 F (36.1 C) (Oral)  Ht 5\' 2"  (1.575 m)  Wt 120 lb (54.432 kg)  BMI 21.94 kg/m2 General appearance: alert and cooperative Eyes: conjunctivae/corneas clear. PERRL, EOM's intact. Fundi benign. Ears: normal TM's and external ear canals both ears Nose: clear discharge, moderate congestion, turbinates red, mild maxillary sinus tenderness bilateral, moderate frontal sinus tenderness bilateral Throat: lips, mucosa, and tongue normal; teeth and gums normal Neck: no adenopathy, no carotid bruit, no JVD, supple, symmetrical, trachea midline and thyroid not enlarged, symmetric, no tenderness/mass/nodules Lungs: clear to auscultation bilaterally Heart: regular rate and rhythm, S1, S2 normal, no murmur, click, rub or gallop    Assessment:    Acute bacterial sinusitis.    Plan:  1. Take meds as prescribed 2. Use a cool mist humidifier especially during the winter months and when heat has been humid. 3. Use saline nose sprays frequently 4. Saline irrigations of the nose can be very helpful if done frequently.  * 4X daily for 1 week*  * Use of a nettie pot can be helpful with this. Follow directions with this* 5. Drink plenty of fluids 6. Keep thermostat turn down low 7.For any cough or  congestion  Use plain Mucinex- regular strength or max strength is fine   * Children- consult with Pharmacist for dosing 8. For fever or aces or pains- take tylenol or ibuprofen appropriate for age and weight.  * for fevers greater than 101 orally you may alternate ibuprofen and tylenol every  3 hours.   Meds ordered this encounter  Medications  . azithromycin (ZITHROMAX Z-PAK) 250 MG tablet    Sig: As directed    Dispense:  1 each    Refill:  0    Order Specific Question:  Supervising Provider    Answer:  Eustaquio Maize [[id number]]   Mary-Margaret Hassell Done, FNP

## 2016-01-08 DIAGNOSIS — D485 Neoplasm of uncertain behavior of skin: Secondary | ICD-10-CM | POA: Diagnosis not present

## 2016-01-08 DIAGNOSIS — L570 Actinic keratosis: Secondary | ICD-10-CM | POA: Diagnosis not present

## 2016-02-21 DIAGNOSIS — M810 Age-related osteoporosis without current pathological fracture: Secondary | ICD-10-CM | POA: Diagnosis not present

## 2016-02-21 DIAGNOSIS — Z853 Personal history of malignant neoplasm of breast: Secondary | ICD-10-CM | POA: Diagnosis not present

## 2016-02-21 DIAGNOSIS — R921 Mammographic calcification found on diagnostic imaging of breast: Secondary | ICD-10-CM | POA: Diagnosis not present

## 2016-03-31 ENCOUNTER — Ambulatory Visit (INDEPENDENT_AMBULATORY_CARE_PROVIDER_SITE_OTHER): Payer: Medicare Other | Admitting: *Deleted

## 2016-03-31 DIAGNOSIS — Z23 Encounter for immunization: Secondary | ICD-10-CM | POA: Diagnosis not present

## 2016-03-31 NOTE — Progress Notes (Signed)
Pt given Pneumovax. Tolerated well.

## 2016-04-01 ENCOUNTER — Telehealth: Payer: Self-pay | Admitting: Family Medicine

## 2016-04-01 NOTE — Telephone Encounter (Signed)
Spoke with pt regarding Pneumovax She has some soreness and redness around inj site Informed pt that this is normal and to take Ibuprofen or Tylenol for pain Pt verbalizes understanding

## 2016-05-14 NOTE — Assessment & Plan Note (Signed)
Left breast cancer status post lumpectomy, invasive ductal carcinoma ER 80% PR 10% Ki-67 is 60% T1 C. N0 M0 stage IA status post radiation therapy and currently on Arimidex since December 2012   Arimidex toxicities: 1. Bone density 02/17/2014 revealed a T score of -3.4 which is profound osteoporosis Otherwise patient denies any hot flashes or myalgias.  Osteoporosis management: currently takes Boniva once a month. She appears to be tolerating it extremely well.  Breast cancer surveillance: 1. Breast exam 12/22//2017 is normal 2. Mammogram 02/18/2015 is normal  Mammogram done at [facility] revealed benign-appearing calcifications in the right breast and the recommended as six-month follow-up. This she will have in March 2017.  RTC  In one year with survivorship [place]

## 2016-05-15 ENCOUNTER — Encounter: Payer: Self-pay | Admitting: Hematology and Oncology

## 2016-05-15 ENCOUNTER — Ambulatory Visit (HOSPITAL_BASED_OUTPATIENT_CLINIC_OR_DEPARTMENT_OTHER): Payer: Medicare Other | Admitting: Hematology and Oncology

## 2016-05-15 DIAGNOSIS — C50412 Malignant neoplasm of upper-outer quadrant of left female breast: Secondary | ICD-10-CM

## 2016-05-15 DIAGNOSIS — M810 Age-related osteoporosis without current pathological fracture: Secondary | ICD-10-CM | POA: Diagnosis not present

## 2016-05-15 DIAGNOSIS — Z79811 Long term (current) use of aromatase inhibitors: Secondary | ICD-10-CM

## 2016-05-15 DIAGNOSIS — Z170 Estrogen receptor positive status [ER+]: Principal | ICD-10-CM

## 2016-05-15 MED ORDER — ANASTROZOLE 1 MG PO TABS: ORAL_TABLET | ORAL | 3 refills | Status: DC

## 2016-05-15 NOTE — Progress Notes (Signed)
Patient Care Team: Timmothy Euler, MD as PCP - General (Family Medicine)  DIAGNOSIS:  Encounter Diagnosis  Name Primary?  . Carcinoma of upper-outer quadrant of left breast in female, estrogen receptor positive ([place])     SUMMARY OF ONCOLOGIC HISTORY:   Carcinoma of upper-outer quadrant of left female breast ([place])   12/25/2010 Surgery    Left breast lumpectomy invasive ductal carcinoma with high-grade DCIS ER 80% PR 10% Ki-67 60% T1 a. N0 M0 stage IA (IDC measured 0.1 cm)      02/04/2011 - 03/06/2011 Radiation Therapy    Radiation therapy to lumpectomy site by Dr. Valere Dross      04/26/2011 -  Anti-estrogen oral therapy    Arimidex 1 mg daily. Plan is to treat her for 5 years       CHIEF COMPLIANT: Follow-up on anastrozole  INTERVAL HISTORY: Deborah Lowe is a 75 year old with above-mentioned history of left breast cancer currently on adjuvant Arimidex therapy. She is tolerating it extremely well. She does not have any hot flashes or myalgias. She had a bone density test which continues to show osteoporosis but she is currently on Boniva and calcium and vitamin D. She denies any lumps or nodules in breast.  REVIEW OF SYSTEMS:   Constitutional: Denies fevers, chills or abnormal weight loss Eyes: Denies blurriness of vision Ears, nose, mouth, throat, and face: Denies mucositis or sore throat Respiratory: Denies cough, dyspnea or wheezes Cardiovascular: Denies palpitation, chest discomfort Gastrointestinal:  Denies nausea, heartburn or change in bowel habits Skin: Denies abnormal skin rashes Lymphatics: Denies new lymphadenopathy or easy bruising Neurological:Denies numbness, tingling or new weaknesses Behavioral/Psych: Mood is stable, no new changes  Extremities: No lower extremity edema Breast:  denies any pain or lumps or nodules in either breasts All other systems were reviewed with the patient and are negative.  I have reviewed the past medical history, past surgical  history, social history and family history with the patient and they are unchanged from previous note.  ALLERGIES:  is allergic to avelox [moxifloxacin hcl in nacl].  MEDICATIONS:  Current Outpatient Prescriptions  Medication Sig Dispense Refill  . anastrozole (ARIMIDEX) 1 MG tablet TAKE 1 TABLET (1 MG TOTAL) BY MOUTH DAILY. 90 tablet 3  . azithromycin (ZITHROMAX Z-PAK) 250 MG tablet As directed 1 each 0  . Investigational vitamin D 600 UNITS capsule SWOG S0812 Take 600 Units by mouth daily. Take with food.    Marland Kitchen losartan-hydrochlorothiazide (HYZAAR) 100-12.5 MG tablet TAKE 1 TABLET BY MOUTH DAILY. 90 tablet 3  . risedronate (ACTONEL) 150 MG tablet Take 1 tablet (150 mg total) by mouth every 30 (thirty) days. with water on empty stomach, nothing by mouth or lie down for next 30 minutes. 6 tablet 1  . TOPROL XL 25 MG 24 hr tablet TAKE 1 TABLET (25 MG TOTAL) BY MOUTH DAILY. 90 tablet 3   No current facility-administered medications for this visit.     PHYSICAL EXAMINATION: ECOG PERFORMANCE STATUS: 0 - Asymptomatic  Vitals:   05/15/16 1053  BP: (!) 152/58  Pulse: 71  Resp: 17  Temp: 97.8 F (36.6 C)   Filed Weights   05/15/16 1053  Weight: 118 lb 3.2 oz (53.6 kg)    GENERAL:alert, no distress and comfortable SKIN: skin color, texture, turgor are normal, no rashes or significant lesions EYES: normal, Conjunctiva are pink and non-injected, sclera clear OROPHARYNX:no exudate, no erythema and lips, buccal mucosa, and tongue normal  NECK: supple, thyroid normal size, non-tender, without  nodularity LYMPH:  no palpable lymphadenopathy in the cervical, axillary or inguinal LUNGS: clear to auscultation and percussion with normal breathing effort HEART: regular rate & rhythm and no murmurs and no lower extremity edema ABDOMEN:abdomen soft, non-tender and normal bowel sounds MUSCULOSKELETAL:no cyanosis of digits and no clubbing  NEURO: alert & oriented x 3 with fluent speech, no focal  motor/sensory deficits EXTREMITIES: No lower extremity edema BREAST: No palpable masses or nodules in either right or left breasts. No palpable axillary supraclavicular or infraclavicular adenopathy no breast tenderness or nipple discharge. (exam performed in the presence of a chaperone)  LABORATORY DATA:  I have reviewed the data as listed   Chemistry      Component Value Date/Time   NA 133 (L) 12/05/2015 0953   NA 133 (L) 05/17/2015 1107   K 5.0 12/05/2015 0953   K 3.6 05/17/2015 1107   CL 96 (L) 12/05/2015 0953   CL 97 (L) 08/04/2012 1010   CO2 24 12/05/2015 0953   CO2 28 05/17/2015 1107   BUN 12 12/05/2015 0953   BUN 9.9 05/17/2015 1107   CREATININE 0.70 12/05/2015 0953   CREATININE 0.8 05/17/2015 1107      Component Value Date/Time   CALCIUM 9.5 12/05/2015 0953   CALCIUM 9.5 05/17/2015 1107   ALKPHOS 58 12/05/2015 0953   ALKPHOS 71 05/17/2015 1107   AST 17 12/05/2015 0953   AST 12 05/17/2015 1107   ALT 14 12/05/2015 0953   ALT 11 05/17/2015 1107   BILITOT 0.8 12/05/2015 0953   BILITOT 0.91 05/17/2015 1107       Lab Results  Component Value Date   WBC 6.7 05/17/2015   HGB 13.5 05/17/2015   HCT 39.4 05/17/2015   MCV 85.7 05/17/2015   PLT 245 05/17/2015   NEUTROABS 4.1 05/17/2015    ASSESSMENT & PLAN:  Carcinoma of upper-outer quadrant of left female breast Left breast cancer status post lumpectomy, invasive ductal carcinoma ER 80% PR 10% Ki-67 is 60% T1 C. N0 M0 stage IA status post radiation therapy and currently on Arimidex since December 2012   Arimidex toxicities: 1. Bone density 02/18/2016 revealed a T score of -3.6 which is profound osteoporosis but it has remained stable since 2015 Otherwise patient denies any hot flashes or myalgias. Patient wishes to stay on Arimidex beyond 5 years. We discussed the pros and cons of it including the risk of fractures. Patient understands these risks and wishes to stay on it. We discussed the role of breast cancer  index as well. We will not do the test at this time. If however she decides she wants to stop and then we can run the breast cancer index to determine if she is safe to stop the treatment.  Osteoporosis management: currently takes Boniva once a month. She appears to be tolerating it extremely well.  Breast cancer surveillance: 1. Breast exam 12/22//2017 is normal 2. Mammogram 02/18/2016 is normal  Mammogram done at [facility] revealed benign-appearing calcifications in the right breast and the recommended as six-month follow-up. This she will have in March 2017.  RTC  In one year  No orders of the defined types were placed in this encounter.  The patient has a good understanding of the overall plan. she agrees with it. she will call with any problems that may develop before the next visit here.   Rulon Eisenmenger, MD 05/15/16

## 2016-06-01 ENCOUNTER — Other Ambulatory Visit: Payer: Self-pay | Admitting: Hematology and Oncology

## 2016-06-01 DIAGNOSIS — C50412 Malignant neoplasm of upper-outer quadrant of left female breast: Secondary | ICD-10-CM

## 2016-06-12 ENCOUNTER — Ambulatory Visit: Payer: Medicare Other | Admitting: Pediatrics

## 2016-06-16 ENCOUNTER — Ambulatory Visit (INDEPENDENT_AMBULATORY_CARE_PROVIDER_SITE_OTHER): Payer: Medicare Other | Admitting: Pediatrics

## 2016-06-16 ENCOUNTER — Encounter: Payer: Self-pay | Admitting: Pediatrics

## 2016-06-16 VITALS — BP 139/70 | HR 68 | Temp 97.2°F | Ht 62.0 in | Wt 120.6 lb

## 2016-06-16 DIAGNOSIS — L309 Dermatitis, unspecified: Secondary | ICD-10-CM

## 2016-06-16 MED ORDER — MOMETASONE FUROATE 0.1 % EX SOLN: Freq: Every day | CUTANEOUS | 0 refills | Status: DC

## 2016-06-16 NOTE — Progress Notes (Signed)
  Subjective:   Patient ID: Deborah Lowe, female    DOB: 1940/10/02, 76 y.o.   MRN: [id number] CC: Itchy Scalp (1 year)  HPI: [name] is a 76 y.o. female presenting for Itchy Scalp (1 year)  Was seen by dermatology, told to use selsum blue, didn't help Went back told to switch shampoos, also hasnt helped Doesn't see much flakiness or rash Uses head shoulders all the time, or dove for dandruff Doing selsum blue every two weeks now, dandruff shampoo when she washes her hair apprx every 3 days itchyness bothers her all the time, especially at night  Relevant past medical, surgical, family and social history reviewed. Allergies and medications reviewed and updated. History  Smoking Status  . Never Smoker  Smokeless Tobacco  . Never Used   ROS: Per HPI   Objective:    BP 139/70   Pulse 68   Temp 97.2 F (36.2 C) (Oral)   Ht 5\' 2"  (1.575 m)   Wt 120 lb 9.6 oz (54.7 kg)   BMI 22.06 kg/m   Wt Readings from Last 3 Encounters:  06/16/16 120 lb 9.6 oz (54.7 kg)  05/15/16 118 lb 3.2 oz (53.6 kg)  12/13/15 120 lb (54.4 kg)    Gen: NAD, alert, cooperative with exam, NCAT EYES: EOMI, no conjunctival injection, or no icterus CV: WWP Resp: normal WOB Ext: No edema, warm Neuro: Alert and oriented Skin: several patches of red, ill-defined, on posterior scalp over occiput  Assessment & Plan:  Deborah Lowe was seen today for itchy scalp.  Diagnoses and all orders for this visit:  Dermatitis Using selsun blue/dandruff shampoo regularly Not helping No flakiness today, no crusting Appears red and irritated, not clearly seb derm Use med potency topical steroid solution daily for two weeks, if not improving let me know If flakiness returns, let me know -     mometasone (ELOCON) 0.1 % solution; Apply topically daily. For 2 weeks onto scalp   Follow up plan: prn Assunta Found, MD [place]

## 2016-06-16 NOTE — Patient Instructions (Addendum)
Stop selsun blue shampoos Use only very gentle shampoos or baby shampoos that are non-irrtating  Steroid solution:  Apply once daily for 2 weeks  Let me know if not improving  If starts to have flaking areas in scalp let me know

## 2016-07-08 ENCOUNTER — Ambulatory Visit: Payer: Medicare Other | Admitting: Pediatrics

## 2016-07-16 ENCOUNTER — Encounter: Payer: Self-pay | Admitting: Pediatrics

## 2016-07-16 ENCOUNTER — Ambulatory Visit (INDEPENDENT_AMBULATORY_CARE_PROVIDER_SITE_OTHER): Payer: Medicare Other | Admitting: Pediatrics

## 2016-07-16 VITALS — BP 145/73 | HR 63 | Temp 97.3°F | Ht 62.0 in | Wt 119.0 lb

## 2016-07-16 DIAGNOSIS — L309 Dermatitis, unspecified: Secondary | ICD-10-CM

## 2016-07-16 MED ORDER — FLUOCINONIDE 0.05 % EX SOLN: 1.0000 "application " | Freq: Two times a day (BID) | CUTANEOUS | 0 refills | Status: DC

## 2016-07-16 NOTE — Progress Notes (Signed)
  Subjective:   Patient ID: Deborah Lowe, female    DOB: 1940-11-07, 75 y.o.   MRN: [id number] CC: Recheck scalp  HPI: Deborah Lowe is a 76 y.o. female presenting for Recheck scalp  Still itching Tried the mometasone, didn't notice any improvement Has tried swtiching shampoos, doesn't think it makes a difference No flaking or scaling Still in same area, occiput No other rash Gets her hair colored every 5 weeks Doesn't think rash any better or worse around time of coloring  Relevant past medical, surgical, family and social history reviewed. Allergies and medications reviewed and updated. History  Smoking Status  . Never Smoker  Smokeless Tobacco  . Never Used   ROS: Per HPI   Objective:    BP (!) 145/73   Pulse 63   Temp 97.3 F (36.3 C) (Oral)   Ht 5\' 2"  (1.575 m)   Wt 119 lb (54 kg)   BMI 21.77 kg/m   Wt Readings from Last 3 Encounters:  07/16/16 119 lb (54 kg)  06/16/16 120 lb 9.6 oz (54.7 kg)  05/15/16 118 lb 3.2 oz (53.6 kg)    Gen: NAD, alert, cooperative with exam, NCAT EYES: EOMI, no conjunctival injection, or no icterus CV: WWP Resp:  normal WOB Neuro: Alert and oriented, strength equal b/l UE and LE, coordination grossly normal Skin: red patches in scalp along occiput No flakes, no crust, no scale Some telangectasias on surface of   Assessment & Plan:  Deborah Lowe was seen today for recheck scalp.  Diagnoses and all orders for this visit:  Dermatitis Increase strength of topical steroid Needs to get back in to see dermatology -     fluocinonide (LIDEX) 0.05 % external solution; Apply 1 application topically 2 (two) times daily.   Follow up plan: Return if symptoms worsen or fail to improve. Assunta Found, MD [place]

## 2016-08-06 DIAGNOSIS — L570 Actinic keratosis: Secondary | ICD-10-CM | POA: Diagnosis not present

## 2016-08-06 DIAGNOSIS — L578 Other skin changes due to chronic exposure to nonionizing radiation: Secondary | ICD-10-CM | POA: Diagnosis not present

## 2016-08-06 DIAGNOSIS — L219 Seborrheic dermatitis, unspecified: Secondary | ICD-10-CM | POA: Diagnosis not present

## 2016-09-01 ENCOUNTER — Telehealth: Payer: Self-pay | Admitting: Nurse Practitioner

## 2016-09-01 NOTE — Telephone Encounter (Signed)
s/w pt feels like heart is racing like at night. Pt just does not feel good. Bp yesterday was 155/67 hr 72, today, 126/59 hr 70.  Pt will come in tomorrow to see Cecille Rubin.  Appointment made with EKG.

## 2016-09-01 NOTE — Progress Notes (Addendum)
CARDIOLOGY OFFICE NOTE  Date:  09/02/2016    Deborah Lowe Date of Birth: [birth year] Medical Record #[id number]  PCP:  Kenn File, MD  Cardiologist:  [facility] Snare & [facility]ser  Chief Complaint  Patient presents with  . Palpitations    Work in visit - seen for Dr. Acie Fredrickson    History of Present Illness: Deborah Lowe is a 76 y.o. female who presents today for a work in visit. Seen for Dr. Acie Fredrickson - former patient of Dr. Susa Simmonds.   She has had prior breast cancer, HTN, palpitations and has normal coronaries and renal arteries from a 2008 evaluation.   I saw her back in July - she was doing well.   Phone call yesterday - "s/w pt feels like heart is racing like at night. Pt just does not feel good. Bp yesterday was 155/67 hr 72, today, 126/59 hr 70.  Pt will come in tomorrow to see Cecille Rubin.  Appointment made with EKG".   Thus added to my schedule for today.   Comes back today. Here alone. She notes that has had a sensation of palpitations over the past few weeks - all at night - chest feels uncomfortable in her chest. She takes her Metoprolol at night - this helps. This sensation comes and goes - nothing she can do to make it better or worse. Not worse with exertion. She has taken a course of prednisone - just finished - she is not sure if she was having these symptoms prior to taking this - does not feel like she was. She does not use caffeine. No alcohol. Sleeps well. Walks regularly without issue. Doesn't happen every night. Last time was Friday night and notes that was rough - notes skipping/flipping and beating hard. Her BP is typically much better at home. She has had some mild bradycardia.   Past Medical History:  Diagnosis Date  . Breast cancer ([place]) 2012   s/p lumpectomy of left breast with XRT  . Hypertension   . Palpitations    on beta blocker therapy  . Ringing in ears   . Wears glasses     Past Surgical History:  Procedure Laterality Date  . BREAST LUMPECTOMY  Left   . BREAST SURGERY    . CARDIAC CATHETERIZATION  01/29/2007  . CARDIOVASCULAR STRESS TEST  07/19/2002   EF 84%  . CHOLECYSTECTOMY    . TEAR DUCT PROBING       Medications: Current Outpatient Prescriptions  Medication Sig Dispense Refill  . anastrozole (ARIMIDEX) 1 MG tablet TAKE 1 TABLET (1 MG TOTAL) BY MOUTH DAILY. 90 tablet 3  . fluocinonide (LIDEX) 0.05 % external solution Apply 1 application topically 2 (two) times daily. 60 mL 0  . Investigational vitamin D 600 UNITS capsule SWOG S0812 Take 600 Units by mouth daily. Take with food.    Marland Kitchen losartan-hydrochlorothiazide (HYZAAR) 100-12.5 MG tablet TAKE 1 TABLET BY MOUTH DAILY. 90 tablet 3  . risedronate (ACTONEL) 150 MG tablet Take 1 tablet (150 mg total) by mouth every 30 (thirty) days. with water on empty stomach, nothing by mouth or lie down for next 30 minutes. 6 tablet 1  . TOPROL XL 25 MG 24 hr tablet TAKE 1 TABLET (25 MG TOTAL) BY MOUTH DAILY. 90 tablet 3   No current facility-administered medications for this visit.     Allergies: Allergies  Allergen Reactions  . Avelox [Moxifloxacin Hcl In Nacl] Anaphylaxis    Social History: The patient  reports that  she has never smoked. She has never used smokeless tobacco. She reports that she does not drink alcohol or use drugs.   Family History: The patient's family history includes Bone cancer in her brother; Colon cancer in her mother; Heart disease in her father.   Review of Systems: Please see the history of present illness.   Otherwise, the review of systems is positive for none.   All other systems are reviewed and negative.   Physical Exam: VS:  BP (!) 170/92   Pulse 65   Ht 5\' 2"  (1.575 m)   Wt 117 lb 12.8 oz (53.4 kg)   BMI 21.55 kg/m  .  BMI Body mass index is 21.55 kg/m.  Wt Readings from Last 3 Encounters:  09/02/16 117 lb 12.8 oz (53.4 kg)  07/16/16 119 lb (54 kg)  06/16/16 120 lb 9.6 oz (54.7 kg)    General: Pleasant. Well developed, well nourished  and in no acute distress.   HEENT: Normal.  Neck: Supple, no JVD, carotid bruits, or masses noted.  Cardiac: Regular rate and rhythm. No murmurs, rubs, or gallops. No edema.  Respiratory:  Lungs are clear to auscultation bilaterally with normal work of breathing.  GI: Soft and nontender.  MS: No deformity or atrophy. Gait and ROM intact.  Skin: Warm and dry. Color is normal.  Neuro:  Strength and sensation are intact and no gross focal deficits noted.  Psych: Alert, appropriate and with normal affect.   LABORATORY DATA:  EKG:  EKG is ordered today. This demonstrates NSR with nonspecific changes. No arrhythmia noted.   Lab Results  Component Value Date   WBC 6.7 05/17/2015   HGB 13.5 05/17/2015   HCT 39.4 05/17/2015   PLT 245 05/17/2015   GLUCOSE 101 (H) 12/05/2015   CHOL 208 (H) 12/05/2015   TRIG 81 12/05/2015   HDL 66 12/05/2015   LDLDIRECT 140.5 12/07/2012   LDLCALC 126 12/05/2015   ALT 14 12/05/2015   AST 17 12/05/2015   NA 133 (L) 12/05/2015   K 5.0 12/05/2015   CL 96 (L) 12/05/2015   CREATININE 0.70 12/05/2015   BUN 12 12/05/2015   CO2 24 12/05/2015   TSH 2.460 12/23/2012   HGBA1C 5.6 05/30/2013    BNP (last 3 results) No results for input(s): BNP in the last 8760 hours.  ProBNP (last 3 results) No results for input(s): PROBNP in the last 8760 hours.   Other Studies Reviewed Today:   Assessment/Plan: 1. Palpitations - probably exacerbated by the steroid use - will try with changing her timing of her metoprolol to take at dinnertime.  Add PPI as well for a week.  If her symptoms persist over the next week or so, she will need GXT and event monitor arranged. Baseline lab today.   2. HTN - better BP control as outpatient.  She will continue to monitor at home.    3. HLD - not addressed today  Current medicines are reviewed with the patient today.  The patient does not have concerns regarding medicines other than what has been noted above.  The following  changes have been made:  See above.  Labs/ tests ordered today include:    Orders Placed This Encounter  Procedures  . Basic metabolic panel  . TSH  . EKG 12-Lead     Disposition:   FU with me as planned but if her symptoms persist - will place event monitor and consider stress testing.   Patient is agreeable to this  plan and will call if any problems develop in the interim.   SignedTruitt Merle, NP  09/02/2016 2:23 PM  [facility] [address] [place], [place]  [address] Phone: [phone] Fax: [phone]

## 2016-09-01 NOTE — Telephone Encounter (Signed)
New message   Pt is calling stating she is having heart palpitations for the last few days and she does not feel well.   Patient c/o Palpitations:  High priority if patient c/o lightheadedness and shortness of breath.  1. How long have you been having palpitations? A couple days  2. Are you currently experiencing lightheadedness and shortness of breath? No  3. Have you checked your BP and heart rate? (document readings)   4. Are you experiencing any other symptoms? Pt said she just feels tired and weak,.

## 2016-09-02 ENCOUNTER — Encounter: Payer: Self-pay | Admitting: Nurse Practitioner

## 2016-09-02 ENCOUNTER — Ambulatory Visit (INDEPENDENT_AMBULATORY_CARE_PROVIDER_SITE_OTHER): Payer: Medicare Other | Admitting: Nurse Practitioner

## 2016-09-02 VITALS — BP 170/92 | HR 65 | Ht 62.0 in | Wt 117.8 lb

## 2016-09-02 DIAGNOSIS — R002 Palpitations: Secondary | ICD-10-CM | POA: Diagnosis not present

## 2016-09-02 NOTE — Patient Instructions (Addendum)
We will be checking the following labs today - BMET and TSH   Medication Instructions:    Continue with your current medicines. BUT  I want you taking your dose of Metoprolol earlier in the evening - around dinner time.   I want you to take one week's worth of OTC Prilosec, Nexium - each evening    Testing/Procedures To Be Arranged:  N/A  Follow-Up:   See me back as planned in July - but if your spells continue over the next week or so - we will have you come and get a monitor put on.     Other Special Instructions:   N/A    If you need a refill on your cardiac medications before your next appointment, please call your pharmacy.   Call the [place] office at [phone] if you have any questions, problems or concerns.

## 2016-09-03 LAB — BASIC METABOLIC PANEL
BUN/Creatinine Ratio: 17 (ref 12–28)
BUN: 11 mg/dL (ref 8–27)
CO2: 27 mmol/L (ref 18–29)
Calcium: 9.6 mg/dL (ref 8.7–10.3)
Chloride: 90 mmol/L — ABNORMAL LOW (ref 96–106)
Creatinine, Ser: 0.63 mg/dL (ref 0.57–1.00)
GFR calc Af Amer: 103 mL/min/{1.73_m2} (ref >59)
GFR calc non Af Amer: 89 mL/min/{1.73_m2} (ref >59)
Glucose: 93 mg/dL (ref 65–99)
Potassium: 4 mmol/L (ref 3.5–5.2)
Sodium: 133 mmol/L — ABNORMAL LOW (ref 134–144)

## 2016-09-03 LAB — TSH: TSH: 2.62 u[IU]/mL (ref 0.450–4.500)

## 2016-09-03 NOTE — Telephone Encounter (Signed)
Follow Up ° ° °Pt returning phone call. Requesting call back. °

## 2016-09-10 DIAGNOSIS — Z6821 Body mass index (BMI) 21.0-21.9, adult: Secondary | ICD-10-CM | POA: Diagnosis not present

## 2016-09-10 DIAGNOSIS — Z124 Encounter for screening for malignant neoplasm of cervix: Secondary | ICD-10-CM | POA: Diagnosis not present

## 2016-09-10 DIAGNOSIS — Z01419 Encounter for gynecological examination (general) (routine) without abnormal findings: Secondary | ICD-10-CM | POA: Diagnosis not present

## 2016-10-01 ENCOUNTER — Other Ambulatory Visit: Payer: Self-pay | Admitting: Nurse Practitioner

## 2016-11-14 ENCOUNTER — Encounter: Payer: Self-pay | Admitting: Nurse Practitioner

## 2016-12-03 ENCOUNTER — Encounter: Payer: Self-pay | Admitting: Nurse Practitioner

## 2016-12-03 ENCOUNTER — Ambulatory Visit (INDEPENDENT_AMBULATORY_CARE_PROVIDER_SITE_OTHER): Payer: Medicare Other | Admitting: Nurse Practitioner

## 2016-12-03 VITALS — BP 142/70 | HR 54 | Ht 62.0 in | Wt 117.4 lb

## 2016-12-03 DIAGNOSIS — R002 Palpitations: Secondary | ICD-10-CM

## 2016-12-03 DIAGNOSIS — E7800 Pure hypercholesterolemia, unspecified: Secondary | ICD-10-CM

## 2016-12-03 DIAGNOSIS — I10 Essential (primary) hypertension: Secondary | ICD-10-CM | POA: Diagnosis not present

## 2016-12-03 LAB — BASIC METABOLIC PANEL
BUN/Creatinine Ratio: 17 (ref 12–28)
BUN: 11 mg/dL (ref 8–27)
CO2: 26 mmol/L (ref 20–29)
Calcium: 9.5 mg/dL (ref 8.7–10.3)
Chloride: 93 mmol/L — ABNORMAL LOW (ref 96–106)
Creatinine, Ser: 0.65 mg/dL (ref 0.57–1.00)
GFR calc Af Amer: 102 mL/min/{1.73_m2} (ref >59)
GFR calc non Af Amer: 88 mL/min/{1.73_m2} (ref >59)
Glucose: 91 mg/dL (ref 65–99)
Potassium: 3.4 mmol/L — ABNORMAL LOW (ref 3.5–5.2)
Sodium: 134 mmol/L (ref 134–144)

## 2016-12-03 LAB — LIPID PANEL
Chol/HDL Ratio: 2.4 ratio (ref 0.0–4.4)
Cholesterol, Total: 196 mg/dL (ref 100–199)
HDL: 81 mg/dL (ref >39)
LDL Calculated: 101 mg/dL — ABNORMAL HIGH (ref 0–99)
Triglycerides: 69 mg/dL (ref 0–149)
VLDL Cholesterol Cal: 14 mg/dL (ref 5–40)

## 2016-12-03 LAB — HEPATIC FUNCTION PANEL
ALT: 13 IU/L (ref 0–32)
AST: 11 IU/L (ref 0–40)
Albumin: 4.2 g/dL (ref 3.5–4.8)
Alkaline Phosphatase: 53 IU/L (ref 39–117)
Bilirubin Total: 1.1 mg/dL (ref 0.0–1.2)
Bilirubin, Direct: 0.26 mg/dL (ref 0.00–0.40)
Total Protein: 6.2 g/dL (ref 6.0–8.5)

## 2016-12-03 NOTE — Progress Notes (Signed)
CARDIOLOGY OFFICE NOTE  Date:  12/03/2016    Deborah Lowe Date of Birth: Aug 03, 1940 Medical Record #[id number]  PCP:  Timmothy Euler, MD  Cardiologist:  Servando Snare Nahser    Chief Complaint  Patient presents with  . Palpitations    1 year check    History of Present Illness: Deborah Lowe is a 76 y.o. female who presents today for a 1 year check. Seen for Dr. Acie Fredrickson - former patient of Dr. Susa Simmonds.   She has had prior breast cancer, HTN, palpitations and has normal coronaries and renal arteries from a 2008 evaluation.   I saw her back in July of 2017 - she was doing well. Had a work in visit back in April and I saw her - for palpitations - felt to have been triggered by prednisone.   Comes back today. Here alone. She is doing much better. No more palpitations. No chest pain. Some occasional indigestion. Does use some OTC PPI on occasion. For the most part she feels like she is doing well. Remains active. Not dizzy or lightheaded. BP is great at home. She feels like she is doing well.   Past Medical History:  Diagnosis Date  . Breast cancer ([place]) 2012   s/p lumpectomy of left breast with XRT  . Hypertension   . Palpitations    on beta blocker therapy  . Ringing in ears   . Wears glasses     Past Surgical History:  Procedure Laterality Date  . BREAST LUMPECTOMY Left   . BREAST SURGERY    . CARDIAC CATHETERIZATION  01/29/2007  . CARDIOVASCULAR STRESS TEST  07/19/2002   EF 84%  . CHOLECYSTECTOMY    . TEAR DUCT PROBING       Medications: Current Meds  Medication Sig  . calcium-vitamin D (OSCAL WITH D) 250-125 MG-UNIT tablet Take 1 tablet by mouth daily.  Marland Kitchen omeprazole (PRILOSEC) 10 MG capsule Take 10 mg by mouth as needed (indigestion).     Allergies: Allergies  Allergen Reactions  . Avelox [Moxifloxacin Hcl In Nacl] Anaphylaxis    Social History: The patient  reports that she has never smoked. She has never used smokeless tobacco. She reports  that she does not drink alcohol or use drugs.   Family History: The patient's family history includes Bone cancer in her brother; Colon cancer in her mother; Heart disease in her father.   Review of Systems: Please see the history of present illness.   Otherwise, the review of systems is positive for none.   All other systems are reviewed and negative.   Physical Exam: VS:  BP (!) 142/70 (BP Location: Left Arm, Patient Position: Sitting, Cuff Size: Normal)   Pulse (!) 54   Ht 5\' 2"  (1.575 m)   Wt 117 lb 6.4 oz (53.3 kg)   SpO2 100% Comment: at rest  BMI 21.47 kg/m  .  BMI Body mass index is 21.47 kg/m.  Wt Readings from Last 3 Encounters:  12/03/16 117 lb 6.4 oz (53.3 kg)  09/02/16 117 lb 12.8 oz (53.4 kg)  07/16/16 119 lb (54 kg)    General: Pleasant. Well developed, well nourished and in no acute distress.   HEENT: Normal.  Neck: Supple, no JVD, carotid bruits, or masses noted.  Cardiac: Regular rate and rhythm. No murmurs, rubs, or gallops. No edema.  Respiratory:  Lungs are clear to auscultation bilaterally with normal work of breathing.  GI: Soft and nontender.  MS:  No deformity or atrophy. Gait and ROM intact.  Skin: Warm and dry. Color is normal.  Neuro:  Strength and sensation are intact and no gross focal deficits noted.  Psych: Alert, appropriate and with normal affect.   LABORATORY DATA:  EKG:  EKG is not ordered today.   Lab Results  Component Value Date   WBC 6.7 05/17/2015   HGB 13.5 05/17/2015   HCT 39.4 05/17/2015   PLT 245 05/17/2015   GLUCOSE 93 09/02/2016   CHOL 208 (H) 12/05/2015   TRIG 81 12/05/2015   HDL 66 12/05/2015   LDLDIRECT 140.5 12/07/2012   LDLCALC 126 12/05/2015   ALT 14 12/05/2015   AST 17 12/05/2015   NA 133 (L) 09/02/2016   K 4.0 09/02/2016   CL 90 (L) 09/02/2016   CREATININE 0.63 09/02/2016   BUN 11 09/02/2016   CO2 27 09/02/2016   TSH 2.620 09/02/2016   HGBA1C 5.6 05/30/2013     BNP (last 3 results) No results for  input(s): BNP in the last 8760 hours.  ProBNP (last 3 results) No results for input(s): PROBNP in the last 8760 hours.   Other Studies Reviewed Today:   Assessment/Plan: 1. Palpitations - most prior episode back in April probably exacerbated by the steroid use - her symptoms have resolved. I do not feel that further testing is indicated at this time. She remains on low dose beta blocker.   2. HTN - better BP control as outpatient.  She will continue to monitor at home. No changes made today.   3. HLD - lab today. She is not on statin therapy.   Current medicines are reviewed with the patient today.  The patient does not have concerns regarding medicines other than what has been noted above.  The following changes have been made:  See above.  Labs/ tests ordered today include:    Orders Placed This Encounter  Procedures  . Basic metabolic panel  . Hepatic function panel  . Lipid panel     Disposition:   FU with me in 1 year.   Patient is agreeable to this plan and will call if any problems develop in the interim.   SignedTruitt Merle, NP  12/03/2016 9:49 AM  [facility] [address] [place], [place]  [address] Phone: [phone] Fax: [phone]

## 2016-12-03 NOTE — Patient Instructions (Addendum)
We will be checking the following labs today - BMET, HPF and lipids   Medication Instructions:    Continue with your current medicines.     Testing/Procedures To Be Arranged:  N/A  Follow-Up:   See me in one year    Other Special Instructions:   N/A    If you need a refill on your cardiac medications before your next appointment, please call your pharmacy.   Call the [place] office at [phone] if you have any questions, problems or concerns.

## 2016-12-05 ENCOUNTER — Other Ambulatory Visit: Payer: Self-pay | Admitting: *Deleted

## 2016-12-05 DIAGNOSIS — E876 Hypokalemia: Secondary | ICD-10-CM

## 2016-12-16 DIAGNOSIS — L219 Seborrheic dermatitis, unspecified: Secondary | ICD-10-CM | POA: Diagnosis not present

## 2016-12-16 DIAGNOSIS — D485 Neoplasm of uncertain behavior of skin: Secondary | ICD-10-CM | POA: Diagnosis not present

## 2016-12-30 ENCOUNTER — Other Ambulatory Visit: Payer: Self-pay | Admitting: Family Medicine

## 2016-12-30 DIAGNOSIS — J019 Acute sinusitis, unspecified: Secondary | ICD-10-CM

## 2017-01-05 ENCOUNTER — Other Ambulatory Visit: Payer: Medicare Other | Admitting: *Deleted

## 2017-01-05 DIAGNOSIS — E876 Hypokalemia: Secondary | ICD-10-CM

## 2017-01-05 LAB — BASIC METABOLIC PANEL
BUN/Creatinine Ratio: 18 (ref 12–28)
BUN: 12 mg/dL (ref 8–27)
CO2: 28 mmol/L (ref 20–29)
Calcium: 9.9 mg/dL (ref 8.7–10.3)
Chloride: 95 mmol/L — ABNORMAL LOW (ref 96–106)
Creatinine, Ser: 0.66 mg/dL (ref 0.57–1.00)
GFR calc Af Amer: 101 mL/min/{1.73_m2} (ref >59)
GFR calc non Af Amer: 88 mL/min/{1.73_m2} (ref >59)
Glucose: 92 mg/dL (ref 65–99)
Potassium: 4.7 mmol/L (ref 3.5–5.2)
Sodium: 136 mmol/L (ref 134–144)

## 2017-01-07 DIAGNOSIS — L219 Seborrheic dermatitis, unspecified: Secondary | ICD-10-CM | POA: Diagnosis not present

## 2017-02-19 DIAGNOSIS — R921 Mammographic calcification found on diagnostic imaging of breast: Secondary | ICD-10-CM | POA: Diagnosis not present

## 2017-02-19 DIAGNOSIS — R922 Inconclusive mammogram: Secondary | ICD-10-CM | POA: Diagnosis not present

## 2017-02-19 DIAGNOSIS — Z853 Personal history of malignant neoplasm of breast: Secondary | ICD-10-CM | POA: Diagnosis not present

## 2017-02-19 LAB — HM MAMMOGRAPHY

## 2017-03-25 ENCOUNTER — Telehealth: Payer: Self-pay | Admitting: Pediatrics

## 2017-03-25 NOTE — Telephone Encounter (Signed)
Patient aware of pneumonia vaccine dates

## 2017-04-04 ENCOUNTER — Other Ambulatory Visit: Payer: Self-pay | Admitting: Nurse Practitioner

## 2017-04-07 ENCOUNTER — Other Ambulatory Visit: Payer: Self-pay | Admitting: Nurse Practitioner

## 2017-04-21 ENCOUNTER — Ambulatory Visit: Payer: Medicare Other

## 2017-04-22 ENCOUNTER — Ambulatory Visit (INDEPENDENT_AMBULATORY_CARE_PROVIDER_SITE_OTHER): Payer: Medicare Other

## 2017-04-22 DIAGNOSIS — Z23 Encounter for immunization: Secondary | ICD-10-CM | POA: Diagnosis not present

## 2017-04-28 ENCOUNTER — Telehealth: Payer: Self-pay | Admitting: Hematology and Oncology

## 2017-04-28 NOTE — Telephone Encounter (Signed)
No able to reach patient re moving appt from 12/20 to 12/18 ... Schedule mailed

## 2017-04-28 NOTE — Telephone Encounter (Signed)
Left message for patient re moving appt from 12/20 to 12/17. Schedule mailed

## 2017-05-09 NOTE — Assessment & Plan Note (Signed)
Left breast cancer status post lumpectomy, invasive ductal carcinoma ER 80% PR 10% Ki-67 is 60% T1 C. N0 M0 stage IA status post radiation therapy and currently on Arimidex since December 2012   Arimidex toxicities: 1.  Monitoring closely for worsening osteoporosis Otherwise patient denies any hot flashes or myalgias. Patient completed 6 years of therapy on Arimidex.  I discussed with her about stopping therapy at this time based upon continuing progression and worsening of osteoporosis.   Osteoporosis management: currently takes Boniva once a month. She appears to be tolerating it extremely well. Bone density 02/21/2016: T score -3.6, severe osteoporosis   Breast cancer surveillance: 1. Breast exam 05/11/2017 is normal 2. Mammogram 02/21/2016 is normal  Mammogram done at [facility]  RTC  In one year with survivorship

## 2017-05-11 ENCOUNTER — Ambulatory Visit (HOSPITAL_BASED_OUTPATIENT_CLINIC_OR_DEPARTMENT_OTHER): Payer: Medicare Other | Admitting: Hematology and Oncology

## 2017-05-11 DIAGNOSIS — C50412 Malignant neoplasm of upper-outer quadrant of left female breast: Secondary | ICD-10-CM | POA: Diagnosis not present

## 2017-05-11 DIAGNOSIS — Z170 Estrogen receptor positive status [ER+]: Secondary | ICD-10-CM | POA: Diagnosis not present

## 2017-05-11 DIAGNOSIS — M810 Age-related osteoporosis without current pathological fracture: Secondary | ICD-10-CM

## 2017-05-11 NOTE — Progress Notes (Signed)
Patient Care Team: Eustaquio Maize, MD as PCP - General (Pediatrics)  DIAGNOSIS:  Encounter Diagnosis  Name Primary?  . Carcinoma of upper-outer quadrant of left breast in female, estrogen receptor positive ([place])     SUMMARY OF ONCOLOGIC HISTORY:   Carcinoma of upper-outer quadrant of left female breast ([place])   12/25/2010 Surgery    Left breast lumpectomy invasive ductal carcinoma with high-grade DCIS ER 80% PR 10% Ki-67 60% T1 a. N0 M0 stage IA (IDC measured 0.1 cm)      02/04/2011 - 03/06/2011 Radiation Therapy    Radiation therapy to lumpectomy site by Dr. Valere Dross      04/26/2011 - 05/11/2017 Anti-estrogen oral therapy    Arimidex 1 mg daily. Plan is to treat her for 5 years       CHIEF COMPLIANT: Follow-up on Arimidex therapy  INTERVAL HISTORY: Deborah Lowe is a 76 year old with above-mentioned history of left breast cancer with DCIS who is been on oral antiestrogen therapy for the past 6 years.  She has been tolerating the treatment fairly well except for osteoporosis.  She has been taking Boniva for osteoporosis.  Last year her T score was -3.6.  She denies any hot flashes or night sweats.  Denies any arthralgias or myalgias.  REVIEW OF SYSTEMS:   Constitutional: Denies fevers, chills or abnormal weight loss Eyes: Denies blurriness of vision Ears, nose, mouth, throat, and face: Denies mucositis or sore throat Respiratory: Denies cough, dyspnea or wheezes Cardiovascular: Denies palpitation, chest discomfort Gastrointestinal:  Denies nausea, heartburn or change in bowel habits Skin: Denies abnormal skin rashes Lymphatics: Denies new lymphadenopathy or easy bruising Neurological:Denies numbness, tingling or new weaknesses Behavioral/Psych: Mood is stable, no new changes  Extremities: No lower extremity edema Breast:  denies any pain or lumps or nodules in either breasts All other systems were reviewed with the patient and are negative.  I have reviewed the past  medical history, past surgical history, social history and family history with the patient and they are unchanged from previous note.  ALLERGIES:  is allergic to avelox [moxifloxacin hcl in nacl].  MEDICATIONS:  Current Outpatient Medications  Medication Sig Dispense Refill  . calcium-vitamin D (OSCAL WITH D) 250-125 MG-UNIT tablet Take 1 tablet by mouth daily.    . fluticasone (FLONASE) 50 MCG/ACT nasal spray PLACE 2 SPRAYS INTO BOTH NOSTRILS DAILY. 16 g 11  . losartan-hydrochlorothiazide (HYZAAR) 100-12.5 MG tablet TAKE 1 TABLET BY MOUTH DAILY. 90 tablet 1  . omeprazole (PRILOSEC) 10 MG capsule Take 10 mg by mouth as needed (indigestion).    . TOPROL XL 25 MG 24 hr tablet TAKE 1 TABLET (25 MG TOTAL) BY MOUTH DAILY. 90 tablet 2   No current facility-administered medications for this visit.     PHYSICAL EXAMINATION: ECOG PERFORMANCE STATUS: 0 - Asymptomatic  Vitals:   05/11/17 1148  BP: (!) 148/80  Pulse: (!) 54  Resp: 18  Temp: 98 F (36.7 C)  SpO2: 100%   Filed Weights   05/11/17 1148  Weight: 120 lb 12.8 oz (54.8 kg)    GENERAL:alert, no distress and comfortable SKIN: skin color, texture, turgor are normal, no rashes or significant lesions EYES: normal, Conjunctiva are pink and non-injected, sclera clear OROPHARYNX:no exudate, no erythema and lips, buccal mucosa, and tongue normal  NECK: supple, thyroid normal size, non-tender, without nodularity LYMPH:  no palpable lymphadenopathy in the cervical, axillary or inguinal LUNGS: clear to auscultation and percussion with normal breathing effort HEART: regular rate &  rhythm and no murmurs and no lower extremity edema ABDOMEN:abdomen soft, non-tender and normal bowel sounds MUSCULOSKELETAL:no cyanosis of digits and no clubbing  NEURO: alert & oriented x 3 with fluent speech, no focal motor/sensory deficits EXTREMITIES: No lower extremity edema  LABORATORY DATA:  I have reviewed the data as listed   Chemistry        Component Value Date/Time   NA 136 01/05/2017 0840   NA 133 (L) 05/17/2015 1107   K 4.7 01/05/2017 0840   K 3.6 05/17/2015 1107   CL 95 (L) 01/05/2017 0840   CL 97 (L) 08/04/2012 1010   CO2 28 01/05/2017 0840   CO2 28 05/17/2015 1107   BUN 12 01/05/2017 0840   BUN 9.9 05/17/2015 1107   CREATININE 0.66 01/05/2017 0840   CREATININE 0.70 12/05/2015 0953   CREATININE 0.8 05/17/2015 1107      Component Value Date/Time   CALCIUM 9.9 01/05/2017 0840   CALCIUM 9.5 05/17/2015 1107   ALKPHOS 53 12/03/2016 1010   ALKPHOS 71 05/17/2015 1107   AST 11 12/03/2016 1010   AST 12 05/17/2015 1107   ALT 13 12/03/2016 1010   ALT 11 05/17/2015 1107   BILITOT 1.1 12/03/2016 1010   BILITOT 0.91 05/17/2015 1107       Lab Results  Component Value Date   WBC 6.7 05/17/2015   HGB 13.5 05/17/2015   HCT 39.4 05/17/2015   MCV 85.7 05/17/2015   PLT 245 05/17/2015   NEUTROABS 4.1 05/17/2015    ASSESSMENT & PLAN:  Carcinoma of upper-outer quadrant of left female breast Left breast cancer status post lumpectomy, invasive ductal carcinoma ER 80% PR 10% Ki-67 is 60% T1 C. N0 M0 stage IA status post radiation therapy and currently on Arimidex since December 2012   Arimidex toxicities: 1.  Monitoring closely for worsening osteoporosis Otherwise patient denies any hot flashes or myalgias.  Patient completed 6 years of therapy on Arimidex.  She will now stop Arimidex therapy primarily because of worsening of osteoporosis.   Osteoporosis management: currently takes Boniva once a month. She appears to be tolerating it extremely well. Bone density 02/21/2016: T score -3.6, severe osteoporosis   Breast cancer surveillance: 1. Breast exam 05/11/2017 is normal 2. Mammogram 02/21/2016 is normal  Mammogram done at [facility]  RTC  In one year with survivorship   I spent 25 minutes talking to the patient of which more than half was spent in counseling and coordination of care.  No orders of the defined  types were placed in this encounter.  The patient has a good understanding of the overall plan. she agrees with it. she will call with any problems that may develop before the next visit here.   Rulon Eisenmenger, MD 05/11/17

## 2017-05-14 ENCOUNTER — Ambulatory Visit: Payer: Medicare Other | Admitting: Hematology and Oncology

## 2017-06-03 ENCOUNTER — Other Ambulatory Visit: Payer: Self-pay | Admitting: Hematology and Oncology

## 2017-06-03 DIAGNOSIS — C50412 Malignant neoplasm of upper-outer quadrant of left female breast: Secondary | ICD-10-CM

## 2017-06-03 DIAGNOSIS — Z170 Estrogen receptor positive status [ER+]: Principal | ICD-10-CM

## 2017-06-16 ENCOUNTER — Encounter: Payer: Self-pay | Admitting: *Deleted

## 2017-07-12 ENCOUNTER — Other Ambulatory Visit: Payer: Self-pay | Admitting: Nurse Practitioner

## 2017-08-06 DIAGNOSIS — L821 Other seborrheic keratosis: Secondary | ICD-10-CM | POA: Diagnosis not present

## 2017-08-06 DIAGNOSIS — T7840XA Allergy, unspecified, initial encounter: Secondary | ICD-10-CM | POA: Diagnosis not present

## 2017-08-06 DIAGNOSIS — I8310 Varicose veins of unspecified lower extremity with inflammation: Secondary | ICD-10-CM | POA: Diagnosis not present

## 2017-08-06 DIAGNOSIS — L578 Other skin changes due to chronic exposure to nonionizing radiation: Secondary | ICD-10-CM | POA: Diagnosis not present

## 2017-08-20 ENCOUNTER — Ambulatory Visit (INDEPENDENT_AMBULATORY_CARE_PROVIDER_SITE_OTHER): Payer: Medicare Other | Admitting: Family

## 2017-08-20 ENCOUNTER — Encounter: Payer: Self-pay | Admitting: Family

## 2017-08-20 VITALS — BP 132/63 | HR 60 | Temp 97.1°F | Ht 62.0 in | Wt 120.4 lb

## 2017-08-20 DIAGNOSIS — J0100 Acute maxillary sinusitis, unspecified: Secondary | ICD-10-CM

## 2017-08-20 MED ORDER — AZITHROMYCIN 250 MG PO TABS: ORAL_TABLET | ORAL | 0 refills | Status: DC

## 2017-08-20 NOTE — Patient Instructions (Signed)

## 2017-08-20 NOTE — Progress Notes (Signed)
   Subjective:    Patient ID: Deborah Lowe, female    DOB: [birth year], 77 y.o.   MRN: [id number]  Sinusitis  This is a new problem. The current episode started 1 to 4 weeks ago. The problem has been gradually worsening since onset. There has been no fever. The pain is mild. Associated symptoms include congestion, headaches, a hoarse voice, sinus pressure and sneezing. Pertinent negatives include no coughing, ear pain or sore throat. Past treatments include oral decongestants. The treatment provided mild relief.      Review of Systems  HENT: Positive for congestion, hoarse voice, sinus pressure and sneezing. Negative for ear pain and sore throat.   Respiratory: Negative for cough.   Neurological: Positive for headaches.  All other systems reviewed and are negative.      Objective:   Physical Exam  Constitutional: She is oriented to person, place, and time. She appears well-developed and well-nourished. No distress.  HENT:  Head: Normocephalic and atraumatic.  Right Ear: External ear normal.  Nose: Mucosal edema and rhinorrhea present. Right sinus exhibits maxillary sinus tenderness and frontal sinus tenderness. Left sinus exhibits maxillary sinus tenderness and frontal sinus tenderness.  Mouth/Throat: Posterior oropharyngeal erythema present.  Eyes: Pupils are equal, round, and reactive to light.  Neck: Normal range of motion. Neck supple. No thyromegaly present.  Cardiovascular: Normal rate, regular rhythm, normal heart sounds and intact distal pulses.  No murmur heard. Pulmonary/Chest: Breath sounds normal. No respiratory distress. She has no wheezes.  Abdominal: Soft. Bowel sounds are normal. She exhibits no distension. There is no tenderness.  Musculoskeletal: Normal range of motion. She exhibits no edema or tenderness.  Neurological: She is alert and oriented to person, place, and time. She has normal reflexes. No cranial nerve deficit.  Skin: Skin is warm and dry.    Psychiatric: She has a normal mood and affect. Her behavior is normal. Judgment and thought content normal.  Vitals reviewed.   BP 132/63   Pulse 60   Temp (!) 97.1 F (36.2 C) (Oral)   Ht 5\' 2"  (1.575 m)   Wt 120 lb 6.4 oz (54.6 kg)   SpO2 98%   BMI 22.02 kg/m      Assessment & Plan:  1. Acute non-recurrent maxillary sinusitis - Take meds as prescribed - Use a cool mist humidifier  -Use saline nose sprays frequently -Force fluids -For any cough or congestion  Use plain Mucinex- regular strength or max strength is fine -For fever or aces or pains- take tylenol or ibuprofen. -Throat lozenges if help -RTO prn or if symptoms do not improve or worsen - azithromycin (ZITHROMAX) 250 MG tablet; Take 500 mg once, then 250 mg for four days  Dispense: 6 tablet; Refill: 0    Evelina Dun, FNP

## 2017-09-15 DIAGNOSIS — Z124 Encounter for screening for malignant neoplasm of cervix: Secondary | ICD-10-CM | POA: Diagnosis not present

## 2017-09-15 DIAGNOSIS — Z6822 Body mass index (BMI) 22.0-22.9, adult: Secondary | ICD-10-CM | POA: Diagnosis not present

## 2017-10-01 DIAGNOSIS — E0789 Other specified disorders of thyroid: Secondary | ICD-10-CM | POA: Diagnosis not present

## 2017-10-01 DIAGNOSIS — H6121 Impacted cerumen, right ear: Secondary | ICD-10-CM | POA: Diagnosis not present

## 2017-10-05 ENCOUNTER — Other Ambulatory Visit: Payer: Self-pay | Admitting: Otolaryngology

## 2017-10-05 DIAGNOSIS — E079 Disorder of thyroid, unspecified: Secondary | ICD-10-CM

## 2017-10-08 ENCOUNTER — Ambulatory Visit
Admission: RE | Admit: 2017-10-08 | Discharge: 2017-10-08 | Disposition: A | Discharge status: Home / Self Care | Payer: Medicare Other | Source: Ambulatory Visit | Attending: Otolaryngology | Admitting: Otolaryngology

## 2017-10-08 DIAGNOSIS — E041 Nontoxic single thyroid nodule: Secondary | ICD-10-CM | POA: Diagnosis not present

## 2017-10-08 DIAGNOSIS — E079 Disorder of thyroid, unspecified: Secondary | ICD-10-CM

## 2017-10-13 ENCOUNTER — Encounter: Payer: Self-pay | Admitting: Family

## 2017-10-13 ENCOUNTER — Other Ambulatory Visit: Payer: Self-pay | Admitting: Family

## 2017-10-13 ENCOUNTER — Ambulatory Visit (INDEPENDENT_AMBULATORY_CARE_PROVIDER_SITE_OTHER): Payer: Medicare Other | Admitting: Family

## 2017-10-13 VITALS — BP 129/67 | HR 51 | Temp 97.7°F | Ht 62.0 in | Wt 117.2 lb

## 2017-10-13 DIAGNOSIS — K1379 Other lesions of oral mucosa: Secondary | ICD-10-CM | POA: Diagnosis not present

## 2017-10-13 DIAGNOSIS — B370 Candidal stomatitis: Secondary | ICD-10-CM | POA: Diagnosis not present

## 2017-10-13 MED ORDER — FIRST-DUKES MOUTHWASH MT SUSP: 10.0000 mL | Freq: Four times a day (QID) | OROMUCOSAL | 1 refills | Status: DC | PRN

## 2017-10-13 NOTE — Patient Instructions (Signed)
Oral Thrush, Adult Oral thrush, also called oral candidiasis, is a fungal infection that develops in the mouth and throat and on the tongue. It causes white patches to form on the mouth and tongue. Thrush is most common in older adults, but it can occur at any age. Many cases of thrush are mild, but this infection can also be serious. Thrush can be a repeated (recurrent) problem for certain people who have a weak body defense system (immune system). The weakness can be caused by chronic illnesses, or by taking medicines that limit the body's ability to fight infection. If a person has difficulty fighting infection, the fungus that causes thrush can spread through the body. This can cause life-threatening blood or organ infections. What are the causes? This condition is caused by a fungus (yeast) called Candida albicans.  This fungus is normally present in small amounts in the mouth and on other mucous membranes. It usually causes no harm.  If conditions are present that allow the fungus to grow without control, it invades surrounding tissues and becomes an infection.  Other Candida species can also lead to thrush (rare).  What increases the risk? This condition is more likely to develop in:  People with a weakened immune system.  Older adults.  People with HIV (human immunodeficiency virus).  People with diabetes.  People with dry mouth (xerostomia).  Pregnant women.  People with poor dental care, especially people who have false teeth.  People who use antibiotic medicines.  What are the signs or symptoms? Symptoms of this condition can vary from mild and moderate to severe and persistent. Symptoms may include:  A burning feeling in the mouth and throat. This can occur at the start of a thrush infection.  White patches that stick to the mouth and tongue. The tissue around the patches may be red, raw, and painful. If rubbed (during tooth brushing, for example), the patches and the  tissue of the mouth may bleed easily.  A bad taste in the mouth or difficulty tasting foods.  A cottony feeling in the mouth.  Pain during eating and swallowing.  Poor appetite.  Cracking at the corners of the mouth.  How is this diagnosed? This condition is diagnosed based on:  Physical exam. Your health care provider will look in your mouth.  Health history. Your health care provider will ask you questions about your health.  How is this treated? This condition is treated with medicines called antifungals, which prevent the growth of fungi. These medicines are either applied directly to the affected area (topical) or swallowed (oral). The treatment will depend on the severity of the condition. Mild thrush Mild cases of thrush may clear up with the use of an antifungal mouth rinse or lozenges. Treatment usually lasts about 14 days. Moderate to severe thrush  More severe thrush infections that have spread to the esophagus are treated with an oral antifungal medicine. A topical antifungal medicine may also be used.  For some severe infections, treatment may need to continue for more than 14 days.  Oral antifungal medicines are rarely used during pregnancy because they may be harmful to the unborn child. If you are pregnant, talk with your health care provider about options for treatment. Persistent or recurrent thrush For cases of thrush that do not go away or keep coming back:  Treatment may be needed twice as long as the symptoms last.  Treatment will include both oral and topical antifungal medicines.  People with a weakened immune   system can take an antifungal medicine on a continuous basis to prevent thrush infections.  It is important to treat conditions that make a person more likely to get thrush, such as diabetes or HIV. Follow these instructions at home: Medicines  Take over-the-counter and prescription medicines only as told by your health care provider.  Talk  with your health care provider about an over-the-counter medicine called gentian violet, which kills bacteria and fungi. Relieving soreness and discomfort To help reduce the discomfort of thrush:  Drink cold liquids such as water or iced tea.  Try flavored ice treats or frozen juices.  Eat foods that are easy to swallow, such as gelatin, ice cream, or custard.  Try drinking from a straw if the patches in your mouth are painful.  General instructions  Eat plain, unflavored yogurt as directed by your health care provider. Check the label to make sure the yogurt contains live cultures. This yogurt can help healthy bacteria to grow in the mouth and can stop the growth of the fungus that causes thrush.  If you wear dentures, remove the dentures before going to bed, brush them vigorously, and soak them in a cleaning solution as directed by your health care provider.  Rinse your mouth with a warm salt-water mixture several times a day. To make a salt-water mixture, completely dissolve 1/2-1 tsp of salt in 1 cup of warm water. Contact a health care provider if:  Your symptoms are getting worse or are not improving within 7 days of starting treatment.  You have symptoms of a spreading infection, such as white patches on the skin outside of the mouth. This information is not intended to replace advice given to you by your health care provider. Make sure you discuss any questions you have with your health care provider. Document Released: 02/05/2004 Document Revised: 02/04/2016 Document Reviewed: 02/04/2016 Elsevier Interactive Patient Education  2017 Elsevier Inc.  

## 2017-10-13 NOTE — Progress Notes (Signed)
   Subjective:    Patient ID: Deborah Lowe, female    DOB: August 11, 1940, 77 y.o.   MRN: [id number]  Chief Complaint  Patient presents with  . mouth has a film with irritation at times    causes drooling    HPI PT presents to the office today with complaints of mouth burning when eating, swelling, and white coating in oral mucus that is worse on her inner lip. States she noticed this a couple of weeks ago and it is unchanged. States she has put Vaseline on her lips, but was unsure what she could put in her mouth.   PT states she does not take any inhalers or recent steroids.   Review of Systems  HENT: Positive for mouth sores.   All other systems reviewed and are negative.      Objective:   Physical Exam  Constitutional: She is oriented to person, place, and time. She appears well-developed and well-nourished. No distress.  HENT:  Head: Normocephalic and atraumatic.  Right Ear: External ear normal.  Left Ear: External ear normal.  Mouth/Throat: She does not have dentures. No oral lesions. Dental caries present. No dental abscesses. No posterior oropharyngeal edema or posterior oropharyngeal erythema.  Eyes: Pupils are equal, round, and reactive to light.  Neck: Normal range of motion. Neck supple. No thyromegaly present.  Cardiovascular: Regular rhythm and intact distal pulses. Bradycardia present.  Murmur heard. Pulmonary/Chest: Effort normal and breath sounds normal. No respiratory distress. She has no wheezes.  Abdominal: Soft. Bowel sounds are normal. She exhibits no distension. There is no tenderness.  Musculoskeletal: Normal range of motion. She exhibits no edema or tenderness.  Neurological: She is alert and oriented to person, place, and time. She has normal reflexes. No cranial nerve deficit.  Skin: Skin is warm and dry.  Psychiatric: She has a normal mood and affect. Her behavior is normal. Judgment and thought content normal.  Vitals reviewed.     BP 129/67    Pulse (!) 51   Temp 97.7 F (36.5 C) (Oral)   Ht 5\' 2"  (1.575 m)   Wt 117 lb 3.2 oz (53.2 kg)   BMI 21.44 kg/m      Assessment & Plan:  Deborah Lowe was seen today for mouth has a film with irritation at times.  Diagnoses and all orders for this visit:  Mouth pain -     Diphenhyd-Hydrocort-Nystatin (FIRST-DUKES MOUTHWASH) SUSP; Use as directed 10 mLs in the mouth or throat 4 (four) times daily as needed.  Oral thrush   Encourage yogurt  Good dental care Use mouthwash for QID  RTO  if symptoms worsen or do not improve   Evelina Dun, FNP

## 2017-12-02 ENCOUNTER — Ambulatory Visit: Payer: Medicare Other | Admitting: Nurse Practitioner

## 2017-12-08 ENCOUNTER — Encounter (INDEPENDENT_AMBULATORY_CARE_PROVIDER_SITE_OTHER): Payer: Self-pay

## 2017-12-08 ENCOUNTER — Encounter: Payer: Self-pay | Admitting: Nurse Practitioner

## 2017-12-08 ENCOUNTER — Ambulatory Visit (INDEPENDENT_AMBULATORY_CARE_PROVIDER_SITE_OTHER): Payer: Medicare Other | Admitting: Nurse Practitioner

## 2017-12-08 VITALS — BP 160/90 | HR 57 | Ht 62.0 in | Wt 116.8 lb

## 2017-12-08 DIAGNOSIS — E7800 Pure hypercholesterolemia, unspecified: Secondary | ICD-10-CM

## 2017-12-08 DIAGNOSIS — R002 Palpitations: Secondary | ICD-10-CM | POA: Diagnosis not present

## 2017-12-08 DIAGNOSIS — I10 Essential (primary) hypertension: Secondary | ICD-10-CM | POA: Diagnosis not present

## 2017-12-08 LAB — HEPATIC FUNCTION PANEL
ALT: 14 IU/L (ref 0–32)
AST: 15 IU/L (ref 0–40)
Albumin: 4.6 g/dL (ref 3.5–4.8)
Alkaline Phosphatase: 68 IU/L (ref 39–117)
Bilirubin Total: 1.3 mg/dL — ABNORMAL HIGH (ref 0.0–1.2)
Bilirubin, Direct: 0.28 mg/dL (ref 0.00–0.40)
Total Protein: 6.8 g/dL (ref 6.0–8.5)

## 2017-12-08 LAB — BASIC METABOLIC PANEL
BUN/Creatinine Ratio: 14 (ref 12–28)
BUN: 10 mg/dL (ref 8–27)
CO2: 26 mmol/L (ref 20–29)
Calcium: 10 mg/dL (ref 8.7–10.3)
Chloride: 92 mmol/L — ABNORMAL LOW (ref 96–106)
Creatinine, Ser: 0.69 mg/dL (ref 0.57–1.00)
GFR calc Af Amer: 99 mL/min/{1.73_m2} (ref >59)
GFR calc non Af Amer: 86 mL/min/{1.73_m2} (ref >59)
Glucose: 87 mg/dL (ref 65–99)
Potassium: 4.8 mmol/L (ref 3.5–5.2)
Sodium: 132 mmol/L — ABNORMAL LOW (ref 134–144)

## 2017-12-08 LAB — LIPID PANEL
Chol/HDL Ratio: 2.6 ratio (ref 0.0–4.4)
Cholesterol, Total: 195 mg/dL (ref 100–199)
HDL: 75 mg/dL (ref >39)
LDL Calculated: 108 mg/dL — ABNORMAL HIGH (ref 0–99)
Triglycerides: 59 mg/dL (ref 0–149)
VLDL Cholesterol Cal: 12 mg/dL (ref 5–40)

## 2017-12-08 MED ORDER — LOSARTAN POTASSIUM-HCTZ 100-12.5 MG PO TABS: 1.0000 | ORAL_TABLET | Freq: Every day | ORAL | 3 refills | Status: DC

## 2017-12-08 MED ORDER — TOPROL XL 25 MG PO TB24: ORAL_TABLET | ORAL | 3 refills | Status: DC

## 2017-12-08 NOTE — Patient Instructions (Addendum)
We will be checking the following labs today - BMET, lipids and HPF   Medication Instructions:    Continue with your current medicines.   I sent in your refills today    Testing/Procedures To Be Arranged:  N/A  Follow-Up:   See me in one year.      Other Special Instructions:   N/A    If you need a refill on your cardiac medications before your next appointment, please call your pharmacy.   Call the [place] office at [phone] if you have any questions, problems or concerns.

## 2017-12-08 NOTE — Progress Notes (Signed)
CARDIOLOGY OFFICE NOTE  Date:  12/08/2017    Deborah Lowe Date of Birth: [birth year] Medical Record #[id number]  PCP:  Eustaquio Maize, MD  Cardiologist:  Servando Snare Nahser    Chief Complaint  Patient presents with  . Hypertension  . Palpitations    1 year check. Seen for Dr. Acie Fredrickson    History of Present Illness: Deborah Lowe is a 77 y.o. female who presents today for a one year check. Seen for Dr. Acie Fredrickson - former patient of Dr. Susa Simmonds.   She has had prior breast cancer, HTN, palpitations and has normal coronaries and renal arteries from a 2008 evaluation.   Had a work in visit back in April of 2018 that I saw her for - for palpitations - felt to have been triggered by prednisone. Last visit back in July of 2018 and she was doing well.   Comes back today. Here alone. She has had a good year. No real palpitations. She is not dizzy or lightheaded. No chest pain. Breathing is good. Feels good on her medicines. She is fasting today. BP diary from home looks good - she has not had her medicines today.   Past Medical History:  Diagnosis Date  . Breast cancer ([place]) 2012   s/p lumpectomy of left breast with XRT  . Hypertension   . Palpitations    on beta blocker therapy  . Ringing in ears   . Wears glasses     Past Surgical History:  Procedure Laterality Date  . BREAST LUMPECTOMY Left   . BREAST SURGERY    . CARDIAC CATHETERIZATION  01/29/2007  . CARDIOVASCULAR STRESS TEST  07/19/2002   EF 84%  . CHOLECYSTECTOMY    . TEAR DUCT PROBING       Medications: Current Meds  Medication Sig  . calcium-vitamin D (OSCAL WITH D) 250-125 MG-UNIT tablet Take 1 tablet by mouth daily.  Marland Kitchen losartan-hydrochlorothiazide (HYZAAR) 100-12.5 MG tablet Take 1 tablet by mouth daily.  . TOPROL XL 25 MG 24 hr tablet TAKE 1 TABLET (25 MG TOTAL) BY MOUTH DAILY.  . [DISCONTINUED] Diphenhyd-Hydrocort-Nystatin (FIRST-DUKES MOUTHWASH) SUSP Use as directed 10 mLs in the mouth or throat 4  (four) times daily as needed.  . [DISCONTINUED] losartan-hydrochlorothiazide (HYZAAR) 100-12.5 MG tablet TAKE 1 TABLET BY MOUTH DAILY.  . [DISCONTINUED] TOPROL XL 25 MG 24 hr tablet TAKE 1 TABLET (25 MG TOTAL) BY MOUTH DAILY.     Allergies: Allergies  Allergen Reactions  . Avelox [Moxifloxacin Hcl In Nacl] Anaphylaxis    Social History: The patient  reports that she has never smoked. She has never used smokeless tobacco. She reports that she does not drink alcohol or use drugs.   Family History: The patient's family history includes Bone cancer in her brother; Colon cancer in her mother; Heart disease in her father.   Review of Systems: Please see the history of present illness.   Otherwise, the review of systems is positive for none.   All other systems are reviewed and negative.   Physical Exam: VS:  BP (!) 160/90 (BP Location: Left Arm, Patient Position: Sitting, Cuff Size: Normal)   Pulse (!) 57   Ht 5\' 2"  (1.575 m)   Wt 116 lb 12.8 oz (53 kg)   BMI 21.36 kg/m  .  BMI Body mass index is 21.36 kg/m.  Wt Readings from Last 3 Encounters:  12/08/17 116 lb 12.8 oz (53 kg)  10/13/17 117 lb 3.2 oz (53.2  kg)  08/20/17 120 lb 6.4 oz (54.6 kg)    General: Pleasant. Well developed, well nourished and in no acute distress.   HEENT: Normal.  Neck: Supple, no JVD, carotid bruits, or masses noted.  Cardiac: Regular rate and rhythm. Occasional ectopic. No murmurs, rubs, or gallops. No edema.  Respiratory:  Lungs are clear to auscultation bilaterally with normal work of breathing.  GI: Soft and nontender.  MS: No deformity or atrophy. Gait and ROM intact.  Skin: Warm and dry. Color is normal.  Neuro:  Strength and sensation are intact and no gross focal deficits noted.  Psych: Alert, appropriate and with normal affect.   LABORATORY DATA:  EKG:  EKG is ordered today. This demonstrates sinus brady with PVC.  Lab Results  Component Value Date   WBC 6.7 05/17/2015   HGB 13.5  05/17/2015   HCT 39.4 05/17/2015   PLT 245 05/17/2015   GLUCOSE 92 01/05/2017   CHOL 196 12/03/2016   TRIG 69 12/03/2016   HDL 81 12/03/2016   LDLDIRECT 140.5 12/07/2012   LDLCALC 101 (H) 12/03/2016   ALT 13 12/03/2016   AST 11 12/03/2016   NA 136 01/05/2017   K 4.7 01/05/2017   CL 95 (L) 01/05/2017   CREATININE 0.66 01/05/2017   BUN 12 01/05/2017   CO2 28 01/05/2017   TSH 2.620 09/02/2016   HGBA1C 5.6 05/30/2013     BNP (last 3 results) No results for input(s): BNP in the last 8760 hours.  ProBNP (last 3 results) No results for input(s): PROBNP in the last 8760 hours.   Other Studies Reviewed Today:   Assessment/Plan:  1. Palpitations - she is asymptomatic. She remains on low dose beta blocker. No changes made today.   2. HTN - she has not had her medicines today. BP diary from home looks great - no changes made.   3. HLD - she is fasting. Will get lab today. l   Current medicines are reviewed with the patient today.  The patient does not have concerns regarding medicines other than what has been noted above.  The following changes have been made:  See above.  Labs/ tests ordered today include:    Orders Placed This Encounter  Procedures  . Basic metabolic panel  . Hepatic function panel  . Lipid panel  . EKG 12-Lead     Disposition:   FU with me in 1 year.   Patient is agreeable to this plan and will call if any problems develop in the interim.   SignedTruitt Merle, NP  12/08/2017 10:30 AM  [facility] [address] [place], [place]  [address] Phone: [phone] Fax: [phone]

## 2017-12-31 DIAGNOSIS — H40033 Anatomical narrow angle, bilateral: Secondary | ICD-10-CM | POA: Diagnosis not present

## 2017-12-31 DIAGNOSIS — H2513 Age-related nuclear cataract, bilateral: Secondary | ICD-10-CM | POA: Diagnosis not present

## 2018-03-08 DIAGNOSIS — G44219 Episodic tension-type headache, not intractable: Secondary | ICD-10-CM | POA: Diagnosis not present

## 2018-03-10 ENCOUNTER — Ambulatory Visit (INDEPENDENT_AMBULATORY_CARE_PROVIDER_SITE_OTHER): Payer: Medicare Other

## 2018-03-10 DIAGNOSIS — Z23 Encounter for immunization: Secondary | ICD-10-CM

## 2018-04-06 ENCOUNTER — Encounter: Payer: Self-pay | Admitting: Hematology and Oncology

## 2018-04-06 DIAGNOSIS — Z853 Personal history of malignant neoplasm of breast: Secondary | ICD-10-CM | POA: Diagnosis not present

## 2018-04-06 DIAGNOSIS — Z1231 Encounter for screening mammogram for malignant neoplasm of breast: Secondary | ICD-10-CM | POA: Diagnosis not present

## 2018-04-19 ENCOUNTER — Telehealth: Payer: Self-pay | Admitting: *Deleted

## 2018-04-19 MED ORDER — LOSARTAN POTASSIUM 100 MG PO TABS: 100.0000 mg | ORAL_TABLET | Freq: Every day | ORAL | 9 refills | Status: DC

## 2018-04-19 MED ORDER — HYDROCHLOROTHIAZIDE 12.5 MG PO CAPS: 12.5000 mg | ORAL_CAPSULE | Freq: Every day | ORAL | 9 refills | Status: DC

## 2018-04-19 NOTE — Telephone Encounter (Signed)
S/w pt pharmacy is having a hard time getting Hyzzar 100-12.5 mg in.  Pt wants to know if can get medication separate, advised with Cecille Rubin this would be ok.  Will send in to pt's requested pharmacy #[id number] per pt.  Medication list updated.

## 2018-05-10 ENCOUNTER — Inpatient Hospital Stay: Payer: Medicare Other | Attending: Adult Health | Admitting: Adult Health

## 2018-05-10 ENCOUNTER — Encounter: Payer: Self-pay | Admitting: Adult Health

## 2018-05-10 ENCOUNTER — Telehealth: Payer: Self-pay

## 2018-05-10 ENCOUNTER — Telehealth: Payer: Self-pay | Admitting: Adult Health

## 2018-05-10 VITALS — BP 144/69 | HR 58 | Temp 98.3°F | Resp 18 | Ht 62.0 in | Wt 117.4 lb

## 2018-05-10 DIAGNOSIS — Z923 Personal history of irradiation: Secondary | ICD-10-CM | POA: Insufficient documentation

## 2018-05-10 DIAGNOSIS — C50412 Malignant neoplasm of upper-outer quadrant of left female breast: Secondary | ICD-10-CM | POA: Insufficient documentation

## 2018-05-10 DIAGNOSIS — Z170 Estrogen receptor positive status [ER+]: Secondary | ICD-10-CM | POA: Insufficient documentation

## 2018-05-10 DIAGNOSIS — E2839 Other primary ovarian failure: Secondary | ICD-10-CM | POA: Diagnosis not present

## 2018-05-10 NOTE — Progress Notes (Signed)
[facility] Follow up:    Deborah Lowe, Deborah Lowe   DIAGNOSIS: Cancer Staging Carcinoma of upper-outer quadrant of left female breast [facility]) Staging form: Breast, AJCC 7th Edition - Clinical: No stage assigned - Unsigned - Pathologic: Stage IA (T1c, N0, cM0) - Signed by Rulon Eisenmenger, MD on 02/03/2014   SUMMARY OF ONCOLOGIC HISTORY:   Carcinoma of upper-outer quadrant of left female breast ([place])   12/25/2010 Surgery    Left breast lumpectomy invasive ductal carcinoma with high-grade DCIS ER 80% PR 10% Ki-67 60% T1 a. N0 M0 stage IA (IDC measured 0.1 cm)    02/04/2011 - 03/06/2011 Radiation Therapy    Radiation therapy to lumpectomy site by Dr. Valere Dross    04/26/2011 - 05/11/2017 Anti-estrogen oral therapy    Arimidex 1 mg daily. Plan is to treat her for 5 years     CURRENT THERAPY: observation  INTERVAL HISTORY: Deborah Lowe 77 y.o. female returns for evaluation of her h/o stage IA ER/PR positive invasive breast cancer.  Since her last visit, she underwent bilateral breast 3d screening mammogram that showed no mammographic evidence of malignancy and breast density category C.  She was recommended for repeat screening mammogram in 1 year.  She is up to date with her cancer screenings.  She sees her PCP regularly.   Patient Active Problem List   Diagnosis Date Noted  . Headache(784.0) 03/07/2013  . Hypertension 08/13/2011  . Carcinoma of upper-outer quadrant of left female breast ([place]) 02/20/2011    is allergic to avelox [moxifloxacin hcl in nacl].  MEDICAL HISTORY: Past Medical History:  Diagnosis Date  . Breast cancer ([place]) 2012   s/p lumpectomy of left breast with XRT  . Hypertension   . Palpitations    on beta blocker therapy  . Ringing in ears   . Wears glasses     SURGICAL HISTORY: Past Surgical History:  Procedure Laterality Date  . BREAST LUMPECTOMY Left   . BREAST SURGERY    . CARDIAC CATHETERIZATION   01/29/2007  . CARDIOVASCULAR STRESS TEST  07/19/2002   EF 84%  . CHOLECYSTECTOMY    . TEAR DUCT PROBING      SOCIAL HISTORY: Social History   Socioeconomic History  . Marital status: Married    Spouse name: terry  . Number of children: 1  . Years of education: 21  . Highest education level: Not on file  Occupational History  . Occupation: retired  Scientific laboratory technician  . Financial resource strain: Not on file  . Food insecurity:    Worry: Not on file    Inability: Not on file  . Transportation needs:    Medical: Not on file    Non-medical: Not on file  Tobacco Use  . Smoking status: Never Smoker  . Smokeless tobacco: Never Used  Substance and Sexual Activity  . Alcohol use: No  . Drug use: No  . Sexual activity: Yes    Birth control/protection: Post-menopausal  Lifestyle  . Physical activity:    Days per week: Not on file    Minutes per session: Not on file  . Stress: Not on file  Relationships  . Social connections:    Talks on phone: Not on file    Gets together: Not on file    Attends religious service: Not on file    Active member of club or organization: Not on file    Attends meetings of clubs or  organizations: Not on file    Relationship status: Not on file  . Intimate partner violence:    Fear of current or ex partner: Not on file    Emotionally abused: Not on file    Physically abused: Not on file    Forced sexual activity: Not on file  Other Topics Concern  . Not on file  Social History Narrative  . Not on file    FAMILY HISTORY: Family History  Problem Relation Age of Onset  . Colon cancer Mother   . Heart disease Father   . Bone cancer Brother     Review of Systems  Constitutional: Negative for appetite change, chills, fatigue, fever and unexpected weight change.  HENT:   Negative for hearing loss, lump/mass, sore throat and trouble swallowing.   Eyes: Negative for eye problems and icterus.  Respiratory: Negative for chest tightness, cough and  shortness of breath.   Cardiovascular: Negative for chest pain, leg swelling and palpitations.  Gastrointestinal: Negative for abdominal distention, abdominal pain, constipation, diarrhea, nausea and vomiting.  Endocrine: Negative for hot flashes.  Skin: Negative for itching and rash.  Neurological: Negative for dizziness, extremity weakness, headaches and numbness.  Hematological: Negative for adenopathy. Does not bruise/bleed easily.  Psychiatric/Behavioral: Negative for depression. The patient is not nervous/anxious.       PHYSICAL EXAMINATION  ECOG PERFORMANCE STATUS: 0  Vitals:   05/10/18 1020  BP: (!) 144/69  Pulse: (!) 58  Resp: 18  Temp: 98.3 F (36.8 C)  SpO2: 100%    Physical Exam Constitutional:      General: She is not in acute distress.    Appearance: Normal appearance.  HENT:     Head: Normocephalic and atraumatic.     Mouth/Throat:     Mouth: Mucous membranes are moist.     Pharynx: No oropharyngeal exudate.  Eyes:     General: No scleral icterus.    Pupils: Pupils are equal, round, and reactive to light.  Neck:     Musculoskeletal: Neck supple.  Cardiovascular:     Rate and Rhythm: Normal rate and regular rhythm.     Heart sounds: Normal heart sounds.  Pulmonary:     Effort: Pulmonary effort is normal.     Breath sounds: Normal breath sounds.  Abdominal:     General: Abdomen is flat. There is no distension.     Palpations: Abdomen is soft.     Tenderness: There is no abdominal tenderness.  Musculoskeletal:        General: No swelling.  Lymphadenopathy:     Cervical: No cervical adenopathy.  Skin:    General: Skin is warm and dry.     Capillary Refill: Capillary refill takes less than 2 seconds.     Findings: No rash.  Neurological:     General: No focal deficit present.     Mental Status: She is alert and oriented to person, place, and time.  Psychiatric:        Mood and Affect: Mood normal.        Behavior: Behavior normal.      LABORATORY DATA:  CBC    Component Value Date/Time   WBC 6.7 05/17/2015 1107   WBC 5.7 12/07/2012 1029   RBC 4.60 05/17/2015 1107   RBC 4.74 12/07/2012 1029   HGB 13.5 05/17/2015 1107   HCT 39.4 05/17/2015 1107   PLT 245 05/17/2015 1107   MCV 85.7 05/17/2015 1107   MCH 29.3 05/17/2015 1107   [place]  29.4 12/20/2010 1209   MCHC 34.3 05/17/2015 1107   MCHC 34.0 12/07/2012 1029   RDW 12.7 05/17/2015 1107   LYMPHSABS 1.9 05/17/2015 1107   MONOABS 0.6 05/17/2015 1107   EOSABS 0.1 05/17/2015 1107   BASOSABS 0.0 05/17/2015 1107    CMP     Component Value Date/Time   NA 132 (L) 12/08/2017 1033   NA 133 (L) 05/17/2015 1107   K 4.8 12/08/2017 1033   K 3.6 05/17/2015 1107   CL 92 (L) 12/08/2017 1033   CL 97 (L) 08/04/2012 1010   CO2 26 12/08/2017 1033   CO2 28 05/17/2015 1107   GLUCOSE 87 12/08/2017 1033   GLUCOSE 101 (H) 12/05/2015 0953   GLUCOSE 108 05/17/2015 1107   GLUCOSE 94 08/04/2012 1010   BUN 10 12/08/2017 1033   BUN 9.9 05/17/2015 1107   CREATININE 0.69 12/08/2017 1033   CREATININE 0.70 12/05/2015 0953   CREATININE 0.8 05/17/2015 1107   CALCIUM 10.0 12/08/2017 1033   CALCIUM 9.5 05/17/2015 1107   PROT 6.8 12/08/2017 1033   PROT 7.0 05/17/2015 1107   ALBUMIN 4.6 12/08/2017 1033   ALBUMIN 4.0 05/17/2015 1107   AST 15 12/08/2017 1033   AST 12 05/17/2015 1107   ALT 14 12/08/2017 1033   ALT 11 05/17/2015 1107   ALKPHOS 68 12/08/2017 1033   ALKPHOS 71 05/17/2015 1107   BILITOT 1.3 (H) 12/08/2017 1033   BILITOT 0.91 05/17/2015 1107   GFRNONAA 86 12/08/2017 1033   GFRAA 99 12/08/2017 1033        RADIOGRAPHIC STUDIES:         ASSESSMENT and THERAPY PLAN:   Carcinoma of upper-outer quadrant of left female breast Left breast cancer status post lumpectomy, invasive ductal carcinoma ER 80% PR 10% Ki-67 is 60% T1 C. N0 M0 stage IA status post radiation therapy and took Arimidex from December 2012-2018    Osteoporosis management: On calcium and  vitamin d. Bone density 02/21/2016: T score -3.6, severe osteoporosis  Ordered repeat bone density today.  Breast cancer surveillance: Mammogram normal from 03/2018 No sign of recurrence Reviewed healthy diet and exercise To continue with f/u with PCP for her routine cancer screenings, health maintenance  Offered for her to go to PCP for breast cancer surveillance or return here.  She would like to return in one year for LTS follow up.     Orders Placed This Encounter  Procedures  . DG Bone Density    Standing Status:   Future    Standing Expiration Date:   05/10/2019    Order Specific Question:   Reason for Exam (SYMPTOM  OR DIAGNOSIS REQUIRED)    Answer:   estrogen deficiency, osteoporosis    Order Specific Question:   Preferred imaging location?    Answer:   External    Comments:   [facility]    All questions were answered. The patient knows to call the clinic with any problems, questions or concerns. We can certainly see the patient much sooner if necessary.  A total of (30) minutes of face-to-face time was spent with this patient with greater than 50% of that time in counseling and care-coordination.  This note was electronically signed. Scot Dock, NP 05/10/2018

## 2018-05-10 NOTE — Telephone Encounter (Signed)
Referral faxed to [facility] today for Pt.to have Bone density appointment scheduled (FAX) [phone]

## 2018-05-10 NOTE — Patient Instructions (Signed)
Bone Health Bones protect organs, store calcium, and anchor muscles. Good health habits, such as eating nutritious foods and exercising regularly, are important for maintaining healthy bones. They can also help to prevent a condition that causes bones to lose density and become weak and brittle (osteoporosis). Why is bone mass important? Bone mass refers to the amount of bone tissue that you have. The higher your bone mass, the stronger your bones. An important step toward having healthy bones throughout life is to have strong and dense bones during childhood. A young adult who has a high bone mass is more likely to have a high bone mass later in life. Bone mass at its greatest it is called peak bone mass. A large decline in bone mass occurs in older adults. In women, it occurs about the time of menopause. During this time, it is important to practice good health habits, because if more bone is lost than what is replaced, the bones will become less healthy and more likely to break (fracture). If you find that you have a low bone mass, you may be able to prevent osteoporosis or further bone loss by changing your diet and lifestyle. How can I find out if my bone mass is low? Bone mass can be measured with an X-ray test that is called a bone mineral density (BMD) test. This test is recommended for all women who are age 65 or older. It may also be recommended for men who are age 70 or older, or for people who are more likely to develop osteoporosis due to:  Having bones that break easily.  Having a long-term disease that weakens bones, such as kidney disease or rheumatoid arthritis.  Having menopause earlier than normal.  Taking medicine that weakens bones, such as steroids, thyroid hormones, or hormone treatment for breast cancer or prostate cancer.  Smoking.  Drinking three or more alcoholic drinks each day.  What are the nutritional recommendations for healthy bones? To have healthy bones, you  need to get enough of the right minerals and vitamins. Most nutrition experts recommend getting these nutrients from the foods that you eat. Nutritional recommendations vary from person to person. Ask your health care provider what is healthy for you. Here are some general guidelines. Calcium Recommendations Calcium is the most important (essential) mineral for bone health. Most people can get enough calcium from their diet, but supplements may be recommended for people who are at risk for osteoporosis. Good sources of calcium include:  Dairy products, such as low-fat or nonfat milk, cheese, and yogurt.  Dark green leafy vegetables, such as bok choy and broccoli.  Calcium-fortified foods, such as orange juice, cereal, bread, soy beverages, and tofu products.  Nuts, such as almonds.  Follow these recommended amounts for daily calcium intake:  Children, age 1?3: 700 mg.  Children, age 4?8: 1,000 mg.  Children, age 9?13: 1,300 mg.  Teens, age 14?18: 1,300 mg.  Adults, age 19?50: 1,000 mg.  Adults, age 51?70: ? Men: 1,000 mg. ? Women: 1,200 mg.  Adults, age 71 or older: 1,200 mg.  Pregnant and breastfeeding females: ? Teens: 1,300 mg. ? Adults: 1,000 mg.  Vitamin D Recommendations Vitamin D is the most essential vitamin for bone health. It helps the body to absorb calcium. Sunlight stimulates the skin to make vitamin D, so be sure to get enough sunlight. If you live in a cold climate or you do not get outside often, your health care provider may recommend that you take vitamin   D supplements. Good sources of vitamin D in your diet include:  Egg yolks.  Saltwater fish.  Milk and cereal fortified with vitamin D.  Follow these recommended amounts for daily vitamin D intake:  Children and teens, age 1?18: 600 international units.  Adults, age 50 or younger: 400-800 international units.  Adults, age 51 or older: 800-1,000 international units.  Other Nutrients Other nutrients  for bone health include:  Phosphorus. This mineral is found in meat, poultry, dairy foods, nuts, and legumes. The recommended daily intake for adult men and adult women is 700 mg.  Magnesium. This mineral is found in seeds, nuts, dark green vegetables, and legumes. The recommended daily intake for adult men is 400?420 mg. For adult women, it is 310?320 mg.  Vitamin K. This vitamin is found in green leafy vegetables. The recommended daily intake is 120 mg for adult men and 90 mg for adult women.  What type of physical activity is best for building and maintaining healthy bones? Weight-bearing and strength-building activities are important for building and maintaining peak bone mass. Weight-bearing activities cause muscles and bones to work against gravity. Strength-building activities increases muscle strength that supports bones. Weight-bearing and muscle-building activities include:  Walking and hiking.  Jogging and running.  Dancing.  Gym exercises.  Lifting weights.  Tennis and racquetball.  Climbing stairs.  Aerobics.  Adults should get at least 30 minutes of moderate physical activity on most days. Children should get at least 60 minutes of moderate physical activity on most days. Ask your health care provide what type of exercise is best for you. Where can I find more information? For more information, check out the following websites:  National Osteoporosis Foundation: http://nof.org/learn/basics  National Institutes of Health: http://www.niams.nih.gov/Health_Info/Bone/Bone_Health/bone_health_for_life.asp  This information is not intended to replace advice given to you by your health care provider. Make sure you discuss any questions you have with your health care provider. Document Released: 08/02/2003 Document Revised: 11/30/2015 Document Reviewed: 05/17/2014 Elsevier Interactive Patient Education  2018 Elsevier Inc.  

## 2018-05-10 NOTE — Telephone Encounter (Signed)
Gave avs and calendar ° °

## 2018-05-10 NOTE — Assessment & Plan Note (Addendum)
Left breast cancer status post lumpectomy, invasive ductal carcinoma ER 80% PR 10% Ki-67 is 60% T1 C. N0 M0 stage IA status post radiation therapy and took Arimidex from December 2012-2018    Osteoporosis management: On calcium and vitamin d. Bone density 02/21/2016: T score -3.6, severe osteoporosis  Ordered repeat bone density today.  Breast cancer surveillance: Mammogram normal from 03/2018 No sign of recurrence Reviewed healthy diet and exercise To continue with f/u with PCP for her routine cancer screenings, health maintenance  Offered for her to go to PCP for breast cancer surveillance or return here.  She would like to return in one year for LTS follow up.

## 2018-06-08 DIAGNOSIS — M810 Age-related osteoporosis without current pathological fracture: Secondary | ICD-10-CM | POA: Diagnosis not present

## 2018-06-08 DIAGNOSIS — Z853 Personal history of malignant neoplasm of breast: Secondary | ICD-10-CM | POA: Diagnosis not present

## 2018-06-08 LAB — HM DEXA SCAN

## 2018-06-10 ENCOUNTER — Telehealth: Payer: Self-pay

## 2018-06-10 NOTE — Telephone Encounter (Signed)
LVM for patient to inform of BD results that are unchanged.  Center number left for call back with questions.  Copy of results faxed to PCP, Ronnald Collum, NP.

## 2018-08-06 DIAGNOSIS — L578 Other skin changes due to chronic exposure to nonionizing radiation: Secondary | ICD-10-CM | POA: Diagnosis not present

## 2018-08-06 DIAGNOSIS — L821 Other seborrheic keratosis: Secondary | ICD-10-CM | POA: Diagnosis not present

## 2018-08-06 DIAGNOSIS — L570 Actinic keratosis: Secondary | ICD-10-CM | POA: Diagnosis not present

## 2019-01-06 ENCOUNTER — Other Ambulatory Visit: Payer: Self-pay | Admitting: Nurse Practitioner

## 2019-01-07 NOTE — Progress Notes (Signed)
CARDIOLOGY OFFICE NOTE  Date:  01/11/2019    Deborah Lowe Date of Birth: [birth year] Medical Record #[id number]  PCP:  Sharion Balloon, FNP  Cardiologist:  Servando Snare   Chief Complaint  Patient presents with  . Follow-up    History of Present Illness: Deborah Lowe is a 78 y.o. female who presents today for a one year check.  Seen for Dr. Acie Fredrickson - former patient of Dr. Susa Simmonds. She predominantly follows with me.   She has had prior breast cancer, HTN, palpitations and has normal coronaries and renal arteries from a 2008 evaluation.   Had a work in visit back in April of 2018 that I saw her for - for palpitations - felt to have been triggered by prednisone.Last visit back in July of 2019 and she was doing well.   The patient does not have symptoms concerning for COVID-19 infection (fever, chills, cough, or new shortness of breath).   Comes in today. Here alone. She is doing well. Has been staying home over the past 6 months with the pandemic. No chest pain. Not short of breath. She and her husband found some exercises over the Internet for balance and are doing those. BP typically much better at home. No bothersome palpitations. She really has no concerns.   Past Medical History:  Diagnosis Date  . Breast cancer ([place]) 2012   s/p lumpectomy of left breast with XRT  . Hypertension   . Palpitations    on beta blocker therapy  . Ringing in ears   . Wears glasses     Past Surgical History:  Procedure Laterality Date  . BREAST LUMPECTOMY Left   . BREAST SURGERY    . CARDIAC CATHETERIZATION  01/29/2007  . CARDIOVASCULAR STRESS TEST  07/19/2002   EF 84%  . CHOLECYSTECTOMY    . TEAR DUCT PROBING       Medications: Current Meds  Medication Sig  . calcium-vitamin D (OSCAL WITH D) 250-125 MG-UNIT tablet Take 1 tablet by mouth daily.  Marland Kitchen losartan (COZAAR) 100 MG tablet Take 1 tablet (100 mg total) by mouth daily.  . TOPROL XL 25 MG 24 hr tablet TAKE 1 TABLET BY  MOUTH EVERY DAY  . [DISCONTINUED] losartan (COZAAR) 100 MG tablet Take 1 tablet (100 mg total) by mouth daily.     Allergies: Allergies  Allergen Reactions  . Avelox [Moxifloxacin Hcl In Nacl] Anaphylaxis    Social History: The patient  reports that she has never smoked. She has never used smokeless tobacco. She reports that she does not drink alcohol or use drugs.   Family History: The patient's family history includes Bone cancer in her brother; Colon cancer in her mother; Heart disease in her father.   Review of Systems: Please see the history of present illness.   All other systems are reviewed and negative.   Physical Exam: VS:  BP (!) 160/78   Pulse 62   Ht 5\' 2"  (1.575 m)   Wt 115 lb 1.9 oz (52.2 kg)   SpO2 99%   BMI 21.06 kg/m  .  BMI Body mass index is 21.06 kg/m.  Wt Readings from Last 3 Encounters:  01/11/19 115 lb 1.9 oz (52.2 kg)  05/10/18 117 lb 6.4 oz (53.3 kg)  12/08/17 116 lb 12.8 oz (53 kg)   BP is 160/80  General: Pleasant. Well developed, well nourished and in no acute distress.   HEENT: Normal.  Neck: Supple, no JVD, carotid bruits, or  masses noted.  Cardiac: Regular rate and rhythm. No murmurs, rubs, or gallops. No edema.  Respiratory:  Lungs are clear to auscultation bilaterally with normal work of breathing.  GI: Soft and nontender.  MS: No deformity or atrophy. Gait and ROM intact.  Skin: Warm and dry. Color is normal.  Neuro:  Strength and sensation are intact and no gross focal deficits noted.  Psych: Alert, appropriate and with normal affect.   LABORATORY DATA:  EKG:  EKG is ordered today. This demonstrates NSR - PVC noted.  Lab Results  Component Value Date   WBC 6.7 05/17/2015   HGB 13.5 05/17/2015   HCT 39.4 05/17/2015   PLT 245 05/17/2015   GLUCOSE 87 12/08/2017   CHOL 195 12/08/2017   TRIG 59 12/08/2017   HDL 75 12/08/2017   LDLDIRECT 140.5 12/07/2012   LDLCALC 108 (H) 12/08/2017   ALT 14 12/08/2017   AST 15 12/08/2017    NA 132 (L) 12/08/2017   K 4.8 12/08/2017   CL 92 (L) 12/08/2017   CREATININE 0.69 12/08/2017   BUN 10 12/08/2017   CO2 26 12/08/2017   TSH 2.620 09/02/2016   HGBA1C 5.6 05/30/2013     BNP (last 3 results) No results for input(s): BNP in the last 8760 hours.  ProBNP (last 3 results) No results for input(s): PROBNP in the last 8760 hours.   Other Studies Reviewed Today:   Assessment/Plan:  1. Palpitations - she is totally asymptomatic - on low dose beta blocker. EKG is ok. No changes made today.   2. HTN - has better control at home - she will continue to monitor - no changes made for now.   3. HLD -on no medicine - managed with diet - rechecking today.   4. COVID-19 Education: The signs and symptoms of COVID-19 were discussed with the patient and how to seek care for testing (follow up with PCP or arrange E-visit).  The importance of social distancing, staying at home, hand hygiene and wearing a mask when out in public were discussed today.  Current medicines are reviewed with the patient today.  The patient does not have concerns regarding medicines other than what has been noted above.  The following changes have been made:  See above.  Labs/ tests ordered today include:    Orders Placed This Encounter  Procedures  . Basic metabolic panel  . CBC  . Hepatic function panel  . Lipid panel  . EKG 12-Lead     Disposition:   FU with me in 1 year.   Patient is agreeable to this plan and will call if any problems develop in the interim.   SignedTruitt Merle, NP  01/11/2019 10:32 AM  [facility] [address] [place], [place]  [address] Phone: [phone] Fax: [phone]

## 2019-01-11 ENCOUNTER — Encounter: Payer: Self-pay | Admitting: Nurse Practitioner

## 2019-01-11 ENCOUNTER — Ambulatory Visit (INDEPENDENT_AMBULATORY_CARE_PROVIDER_SITE_OTHER): Payer: Medicare Other | Admitting: Nurse Practitioner

## 2019-01-11 ENCOUNTER — Other Ambulatory Visit: Payer: Self-pay

## 2019-01-11 VITALS — BP 160/78 | HR 62 | Ht 62.0 in | Wt 115.1 lb

## 2019-01-11 DIAGNOSIS — R002 Palpitations: Secondary | ICD-10-CM

## 2019-01-11 DIAGNOSIS — E7800 Pure hypercholesterolemia, unspecified: Secondary | ICD-10-CM | POA: Diagnosis not present

## 2019-01-11 DIAGNOSIS — I10 Essential (primary) hypertension: Secondary | ICD-10-CM | POA: Diagnosis not present

## 2019-01-11 DIAGNOSIS — Z7189 Other specified counseling: Secondary | ICD-10-CM | POA: Diagnosis not present

## 2019-01-11 LAB — HEPATIC FUNCTION PANEL
ALT: 12 IU/L (ref 0–32)
AST: 13 IU/L (ref 0–40)
Albumin: 4.6 g/dL (ref 3.7–4.7)
Alkaline Phosphatase: 60 IU/L (ref 39–117)
Bilirubin Total: 1.2 mg/dL (ref 0.0–1.2)
Bilirubin, Direct: 0.28 mg/dL (ref 0.00–0.40)
Total Protein: 6.5 g/dL (ref 6.0–8.5)

## 2019-01-11 LAB — CBC
Hematocrit: 42.4 % (ref 34.0–46.6)
Hemoglobin: 14 g/dL (ref 11.1–15.9)
MCH: 29 pg (ref 26.6–33.0)
MCHC: 33 g/dL (ref 31.5–35.7)
MCV: 88 fL (ref 79–97)
Platelets: 313 10*3/uL (ref 150–450)
RBC: 4.82 x10E6/uL (ref 3.77–5.28)
RDW: 12.4 % (ref 11.7–15.4)
WBC: 5.9 10*3/uL (ref 3.4–10.8)

## 2019-01-11 LAB — LIPID PANEL
Chol/HDL Ratio: 2.7 ratio (ref 0.0–4.4)
Cholesterol, Total: 189 mg/dL (ref 100–199)
HDL: 70 mg/dL (ref >39)
LDL Calculated: 107 mg/dL — ABNORMAL HIGH (ref 0–99)
Triglycerides: 62 mg/dL (ref 0–149)
VLDL Cholesterol Cal: 12 mg/dL (ref 5–40)

## 2019-01-11 LAB — BASIC METABOLIC PANEL
BUN/Creatinine Ratio: 15 (ref 12–28)
BUN: 11 mg/dL (ref 8–27)
CO2: 25 mmol/L (ref 20–29)
Calcium: 10.2 mg/dL (ref 8.7–10.3)
Chloride: 92 mmol/L — ABNORMAL LOW (ref 96–106)
Creatinine, Ser: 0.71 mg/dL (ref 0.57–1.00)
GFR calc Af Amer: 96 mL/min/{1.73_m2} (ref >59)
GFR calc non Af Amer: 84 mL/min/{1.73_m2} (ref >59)
Glucose: 93 mg/dL (ref 65–99)
Potassium: 5 mmol/L (ref 3.5–5.2)
Sodium: 133 mmol/L — ABNORMAL LOW (ref 134–144)

## 2019-01-11 MED ORDER — LOSARTAN POTASSIUM 100 MG PO TABS: 100.0000 mg | ORAL_TABLET | Freq: Every day | ORAL | 3 refills | Status: DC

## 2019-01-11 NOTE — Patient Instructions (Addendum)
After Visit Summary:  We will be checking the following labs today - BMET, CBC, HPF and lipids   Medication Instructions:    Continue with your current medicines.    If you need a refill on your cardiac medications before your next appointment, please call your pharmacy.     Testing/Procedures To Be Arranged:  N/A  Follow-Up:   See me in one year.     At [facility], you and your health needs are our priority.  As part of our continuing mission to provide you with exceptional heart care, we have created designated Provider Care Teams.  These Care Teams include your primary Cardiologist (physician) and Advanced Practice Providers (APPs -  Physician Assistants and Nurse Practitioners) who all work together to provide you with the care you need, when you need it.  Special Instructions:  . Stay safe, stay home, wash your hands for at least 20 seconds and wear a mask when out in public.  . It was good to talk with you today.  Marland Kitchen Keep a check on your BP for me.  . Sign up for My Chart   Call the [facility] office at [phone] if you have any questions, problems or concerns.

## 2019-01-12 ENCOUNTER — Other Ambulatory Visit: Payer: Self-pay | Admitting: *Deleted

## 2019-01-12 DIAGNOSIS — E875 Hyperkalemia: Secondary | ICD-10-CM

## 2019-01-12 NOTE — Progress Notes (Signed)
error 

## 2019-01-16 ENCOUNTER — Other Ambulatory Visit: Payer: Self-pay | Admitting: Nurse Practitioner

## 2019-01-16 DIAGNOSIS — I10 Essential (primary) hypertension: Secondary | ICD-10-CM

## 2019-01-16 DIAGNOSIS — E7800 Pure hypercholesterolemia, unspecified: Secondary | ICD-10-CM

## 2019-01-16 DIAGNOSIS — R002 Palpitations: Secondary | ICD-10-CM

## 2019-01-17 ENCOUNTER — Other Ambulatory Visit: Payer: Self-pay | Admitting: *Deleted

## 2019-01-17 MED ORDER — LOSARTAN POTASSIUM-HCTZ 100-12.5 MG PO TABS: 1.0000 | ORAL_TABLET | Freq: Every day | ORAL | 3 refills | Status: DC

## 2019-01-17 MED ORDER — LOSARTAN POTASSIUM-HCTZ 100-25 MG PO TABS: 1.0000 | ORAL_TABLET | Freq: Every day | ORAL | 3 refills | Status: DC

## 2019-01-17 NOTE — Telephone Encounter (Signed)
S/w pt concerning wrong med sent to pharm pt is taking losartan-hctz 100-12.5 mg daily instead of losartan (100mg  ) daily.  This was not noted on pt's medication list. T/w Merrilee Seashore at the [facility] and gave verbal order for medication change.  Pt stated did not receive results for Aug 18 in [facility].  Stated if havent received by next week to call office.

## 2019-01-17 NOTE — Telephone Encounter (Signed)
Pt calling stating that her medication was sent in wrong. Pt states that she take Losartan-HCTZ and this was not sent in to her pharmacy. Pt would like a call back concerning this matter. Please address

## 2019-02-04 ENCOUNTER — Encounter: Payer: Self-pay | Admitting: Family Medicine

## 2019-02-04 ENCOUNTER — Ambulatory Visit (INDEPENDENT_AMBULATORY_CARE_PROVIDER_SITE_OTHER): Payer: Medicare Other | Admitting: Family Medicine

## 2019-02-04 DIAGNOSIS — J0100 Acute maxillary sinusitis, unspecified: Secondary | ICD-10-CM | POA: Diagnosis not present

## 2019-02-04 MED ORDER — FLUTICASONE PROPIONATE 50 MCG/ACT NA SUSP: 1.0000 | Freq: Two times a day (BID) | NASAL | 6 refills | Status: DC | PRN

## 2019-02-04 MED ORDER — AZITHROMYCIN 250 MG PO TABS: ORAL_TABLET | ORAL | 0 refills | Status: DC

## 2019-02-04 NOTE — Progress Notes (Signed)
   Virtual Visit via telephone Note  I connected with Deborah Lowe on 02/04/19 at 1720 by telephone and verified that I am speaking with the correct person using two identifiers. Deborah Lowe is currently located at home and no other people are currently with her during visit. The provider, Fransisca Kaufmann Koy Lamp, MD is located in their office at time of visit.  Call ended at 1726  I discussed the limitations, risks, security and privacy concerns of performing an evaluation and management service by telephone and the availability of in person appointments. I also discussed with the patient that there may be a patient responsible charge related to this service. The patient expressed understanding and agreed to proceed.   History and Present Illness: Patient is calling in with sinus congestion and drainage and pressure behind the eyes.  She is using saline sprays and trying get it better. It has been going on for 2 weeks. She denies covid contacts. She gets this every year.   No diagnosis found.  Outpatient Encounter Medications as of 02/04/2019  Medication Sig  . calcium-vitamin D (OSCAL WITH D) 250-125 MG-UNIT tablet Take 1 tablet by mouth daily.  Marland Kitchen losartan-hydrochlorothiazide (HYZAAR) 100-12.5 MG tablet Take 1 tablet by mouth daily.  . TOPROL XL 25 MG 24 hr tablet TAKE 1 TABLET BY MOUTH EVERY DAY   No facility-administered encounter medications on file as of 02/04/2019.     Review of Systems  Constitutional: Negative for chills and fever.  HENT: Positive for congestion, postnasal drip, rhinorrhea and sinus pressure. Negative for ear discharge, ear pain, sneezing and sore throat.   Eyes: Negative for pain, redness and visual disturbance.  Respiratory: Positive for cough. Negative for chest tightness and shortness of breath.   Cardiovascular: Negative for chest pain and leg swelling.  Genitourinary: Negative for difficulty urinating and dysuria.  Musculoskeletal: Negative for back pain  and gait problem.  Skin: Negative for rash.  Neurological: Negative for light-headedness and headaches.  Psychiatric/Behavioral: Negative for agitation and behavioral problems.  All other systems reviewed and are negative.   Observations/Objective: Patient sounds comfortable and in no acute distress  Assessment and Plan: Problem List Items Addressed This Visit    None    Visit Diagnoses    Acute non-recurrent maxillary sinusitis    -  Primary   Relevant Medications   azithromycin (ZITHROMAX) 250 MG tablet   fluticasone (FLONASE) 50 MCG/ACT nasal spray   Other Relevant Orders   Novel Coronavirus, NAA ([facility])       Follow Up Instructions: As needed Quarantine and covid testing  I discussed the assessment and treatment plan with the patient. The patient was provided an opportunity to ask questions and all were answered. The patient agreed with the plan and demonstrated an understanding of the instructions.   The patient was advised to call back or seek an in-person evaluation if the symptoms worsen or if the condition fails to improve as anticipated.  The above assessment and management plan was discussed with the patient. The patient verbalized understanding of and has agreed to the management plan. Patient is aware to call the clinic if symptoms persist or worsen. Patient is aware when to return to the clinic for a follow-up visit. Patient educated on when it is appropriate to go to the emergency department.    I provided 6 minutes of non-face-to-face time during this encounter.    Worthy Rancher, MD

## 2019-02-08 ENCOUNTER — Other Ambulatory Visit: Payer: Self-pay

## 2019-02-08 DIAGNOSIS — Z20822 Contact with and (suspected) exposure to covid-19: Secondary | ICD-10-CM

## 2019-02-10 LAB — NOVEL CORONAVIRUS, NAA: SARS-CoV-2, NAA: NOT DETECTED

## 2019-02-16 ENCOUNTER — Other Ambulatory Visit: Payer: Medicare Other | Admitting: *Deleted

## 2019-02-16 ENCOUNTER — Other Ambulatory Visit: Payer: Self-pay

## 2019-02-16 DIAGNOSIS — E875 Hyperkalemia: Secondary | ICD-10-CM | POA: Diagnosis not present

## 2019-02-16 LAB — BASIC METABOLIC PANEL
BUN/Creatinine Ratio: 14 (ref 12–28)
BUN: 9 mg/dL (ref 8–27)
CO2: 26 mmol/L (ref 20–29)
Calcium: 10 mg/dL (ref 8.7–10.3)
Chloride: 93 mmol/L — ABNORMAL LOW (ref 96–106)
Creatinine, Ser: 0.64 mg/dL (ref 0.57–1.00)
GFR calc Af Amer: 101 mL/min/{1.73_m2} (ref >59)
GFR calc non Af Amer: 88 mL/min/{1.73_m2} (ref >59)
Glucose: 90 mg/dL (ref 65–99)
Potassium: 3.9 mmol/L (ref 3.5–5.2)
Sodium: 134 mmol/L (ref 134–144)

## 2019-03-16 ENCOUNTER — Other Ambulatory Visit: Payer: Self-pay

## 2019-03-17 ENCOUNTER — Ambulatory Visit (INDEPENDENT_AMBULATORY_CARE_PROVIDER_SITE_OTHER): Payer: Medicare Other

## 2019-03-17 DIAGNOSIS — Z23 Encounter for immunization: Secondary | ICD-10-CM

## 2019-04-04 DIAGNOSIS — Z6821 Body mass index (BMI) 21.0-21.9, adult: Secondary | ICD-10-CM | POA: Diagnosis not present

## 2019-04-04 DIAGNOSIS — Z124 Encounter for screening for malignant neoplasm of cervix: Secondary | ICD-10-CM | POA: Diagnosis not present

## 2019-04-08 DIAGNOSIS — Z853 Personal history of malignant neoplasm of breast: Secondary | ICD-10-CM | POA: Diagnosis not present

## 2019-04-08 DIAGNOSIS — Z1231 Encounter for screening mammogram for malignant neoplasm of breast: Secondary | ICD-10-CM | POA: Diagnosis not present

## 2019-04-19 ENCOUNTER — Other Ambulatory Visit: Payer: Self-pay

## 2019-05-02 IMAGING — US US THYROID
1 series · 13 of 25 positions shown · non-contrast
Comparison: 08/10/2015; 10/24/2011; 08/12/2010

CLINICAL DATA: Prior ultrasound follow-up. Follow-up thyroid nodule

EXAM:
THYROID ULTRASOUND
TECHNIQUE: Ultrasound examination of the thyroid gland and adjacent soft
tissues was performed.

[Series 1: us thyroid · 0.06mm/px · 13 of 41 slices shown]
[im 1/41]
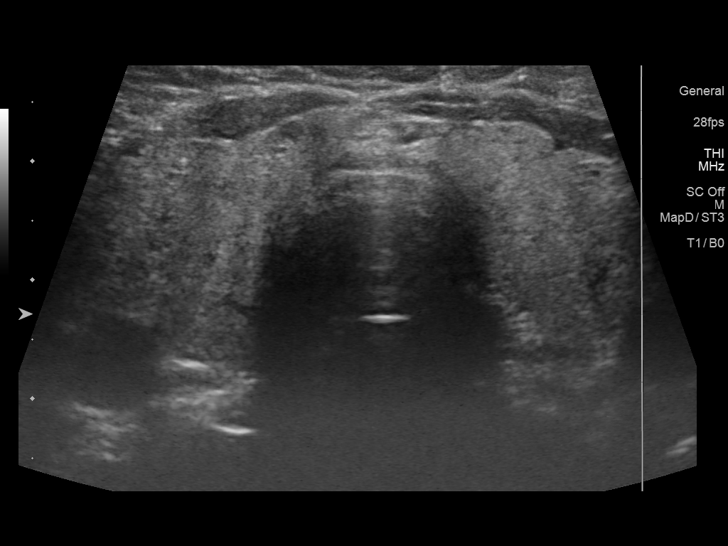
[im 4/41]
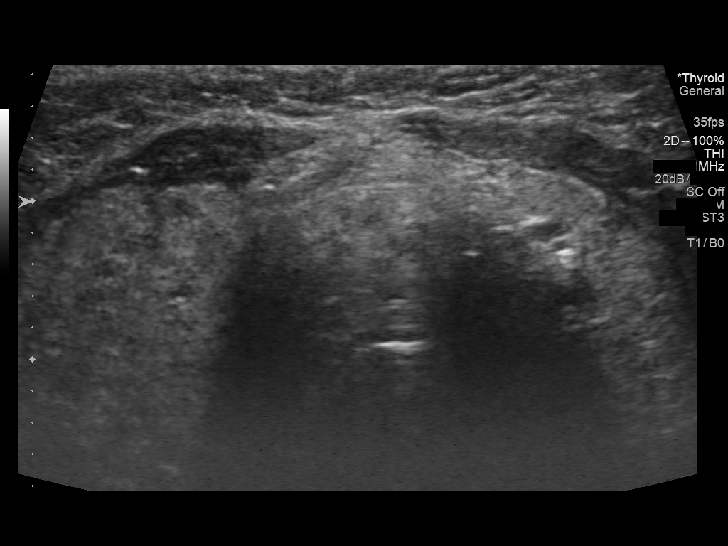
[im 7/41]
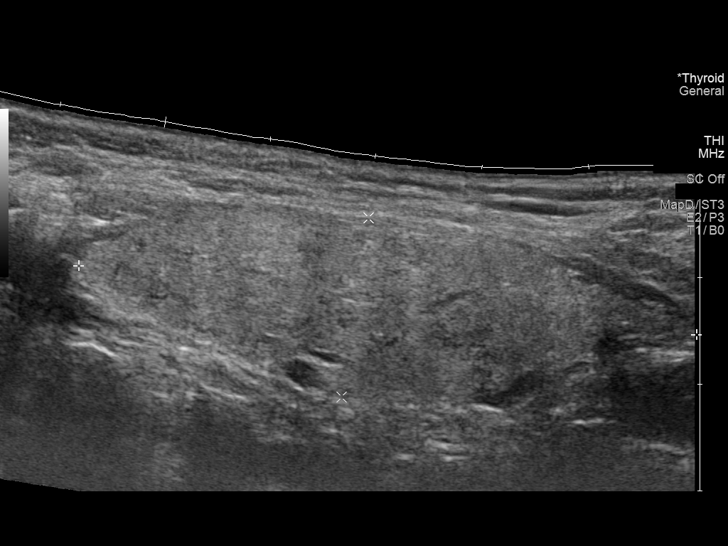
[im 11/41]
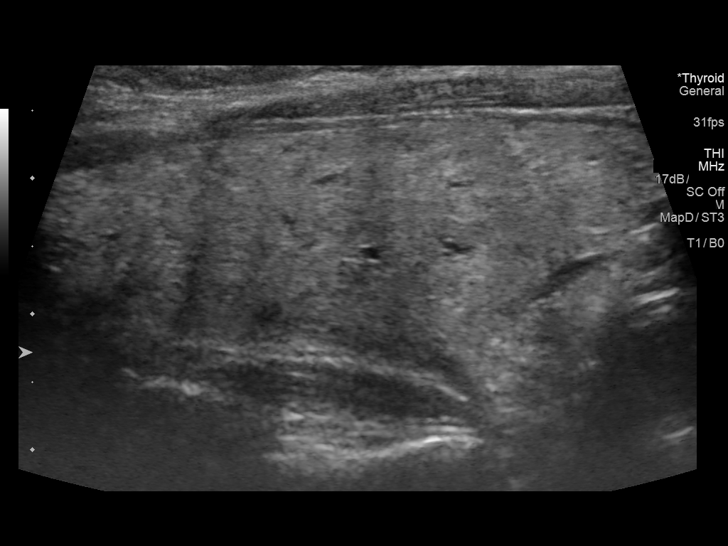
[im 14/41]
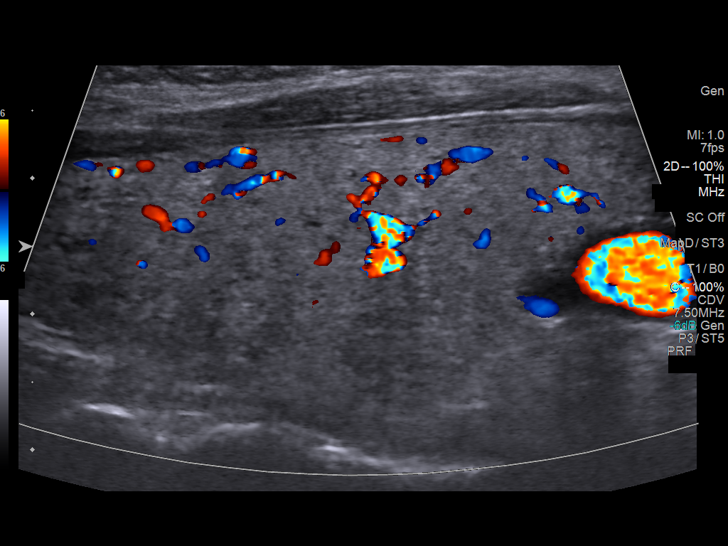
[im 17/41]
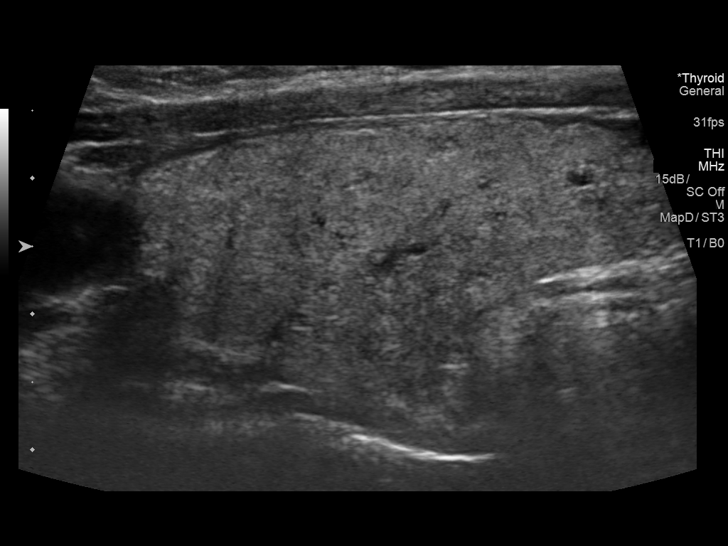
[im 21/41]
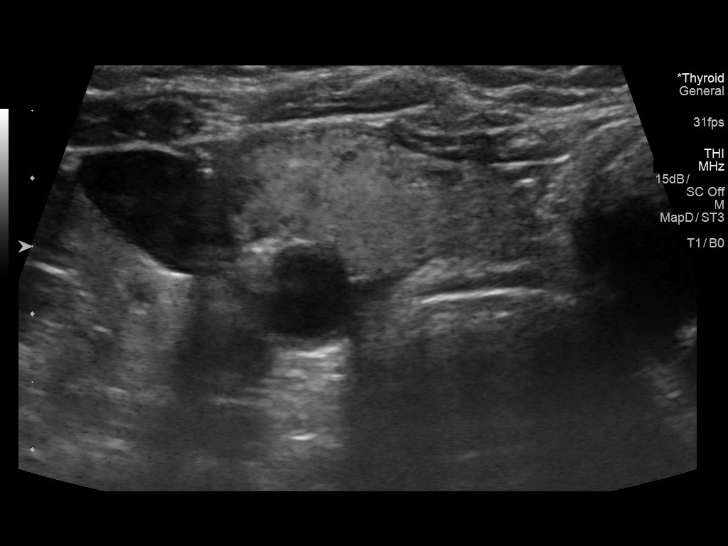
[im 24/41]
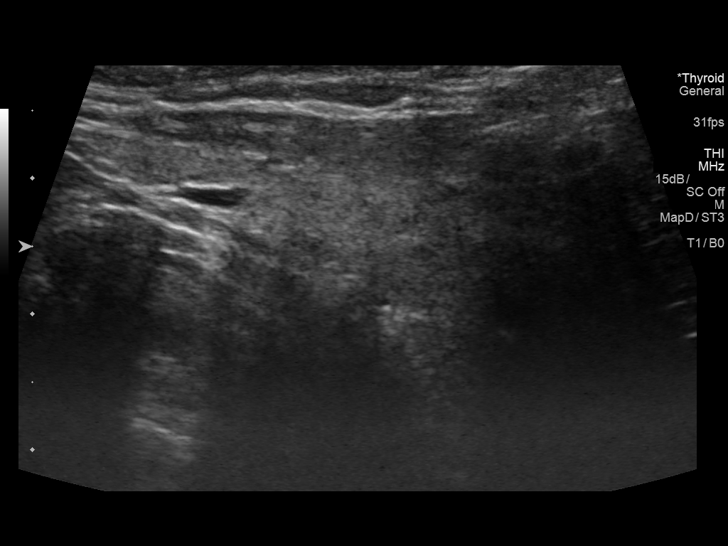
[im 27/41]
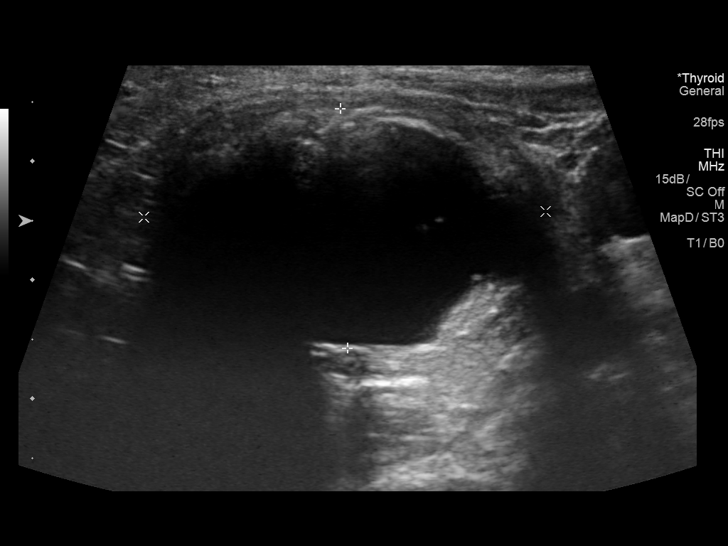
[im 31/41]
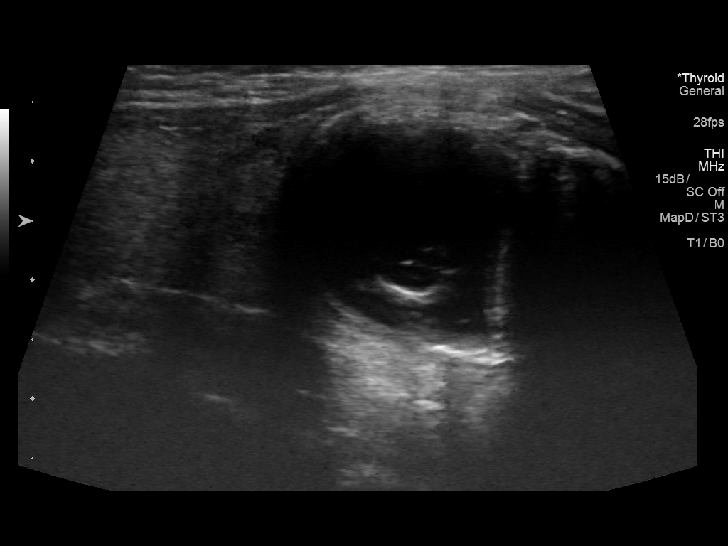
[im 34/41]
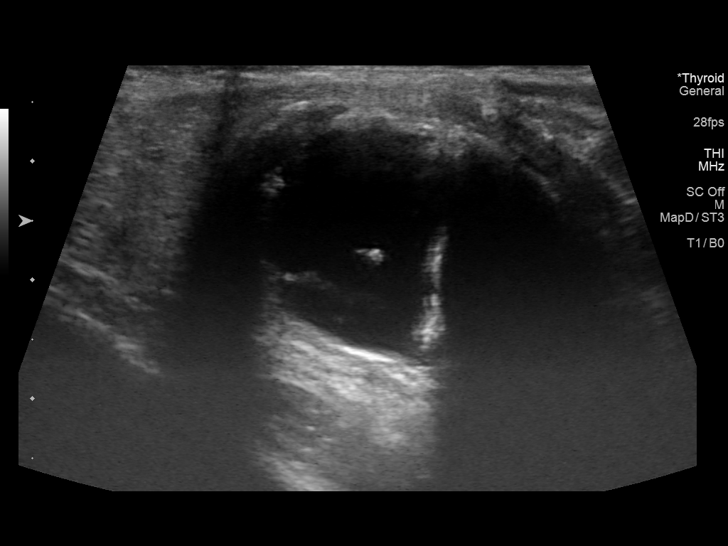
[im 37/41]
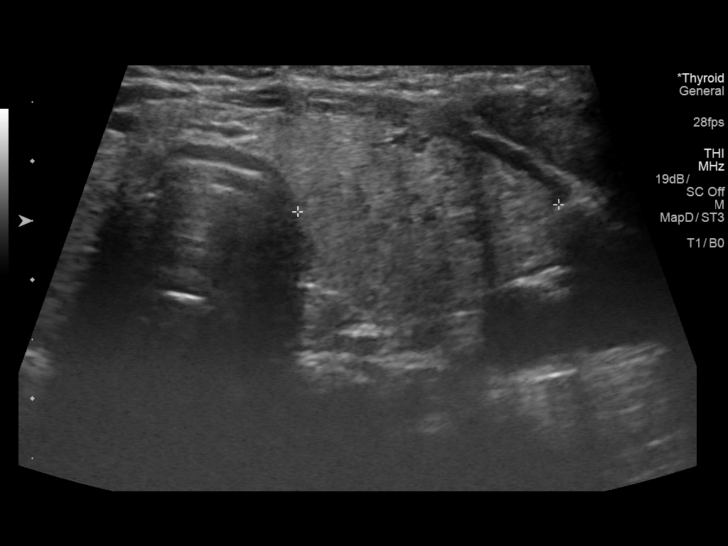
[im 41/41]
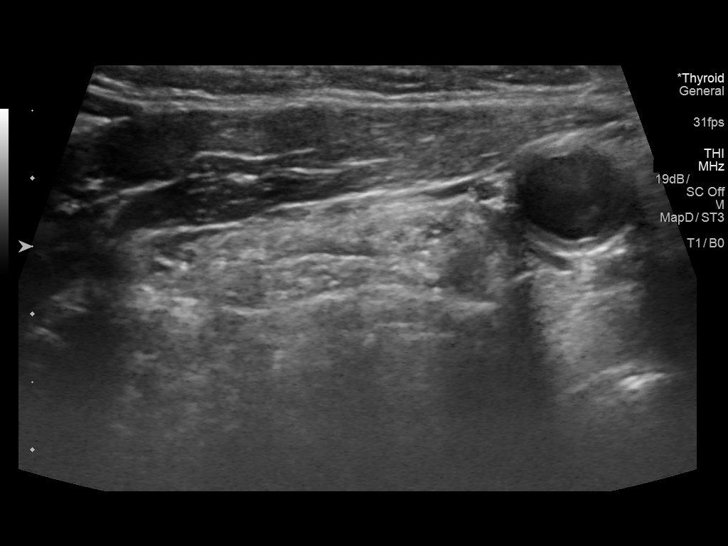

[13 of 25 positions shown; findings below may reference images not displayed]

FINDINGS: Parenchymal Echotexture: Moderately heterogenous

Isthmus: Normal in size measures 0.5 cm in diameter, unchanged

Right lobe: Normal in size measuring 5.8 x 1.7 x 2.8 cm, unchanged,
previously, 5.5 x 2.1 x 2.5 cm

Left lobe: Enlarged measuring 7.1 x 1.8 x 2.2 cm, unchanged,
previously, 7.3 x 2.4 x 2.6 cm

_________________________________________________________

Estimated total number of nodules >/= 1 cm: 1

Number of spongiform nodules >/=  2 cm not described below (TR1): 0

Number of mixed cystic and solid nodules >/= 1.5 cm not described
below (TR2): 0

_________________________________________________________

The approximately 3.4 x 2.0 x 3.4 cm peripherally calcified cystic
nodule/mass within the inferior pole the left lobe of the thyroid is
unchanged to decreased in size compared to the [DATE] examination,
previously, 3.9 x 3.6 x 2.6 cm. Stability for greater than 5 years
is indicative of benign etiology.
IMPRESSION: The approximately 3.4 cm peripherally calcified cystic nodule within
the inferior pole the left lobe of the thyroid is unchanged to
decreased in size compared to the [DATE] examination. Stability for
greater than 5 years is indicative of added etiology and as such,
percutaneous sampling and/or continued dedicated follow-up is not
recommended.

The above is in keeping with the ACR TI-RADS recommendations - [HOSPITAL] 0391;[DATE].

## 2019-05-12 ENCOUNTER — Inpatient Hospital Stay: Payer: Medicare Other | Attending: Adult Health | Admitting: Adult Health

## 2019-05-12 ENCOUNTER — Other Ambulatory Visit: Payer: Self-pay

## 2019-05-12 ENCOUNTER — Encounter: Payer: Self-pay | Admitting: Adult Health

## 2019-05-12 VITALS — BP 156/77 | HR 63 | Temp 98.0°F | Resp 17 | Ht 62.0 in | Wt 115.1 lb

## 2019-05-12 DIAGNOSIS — Z923 Personal history of irradiation: Secondary | ICD-10-CM | POA: Insufficient documentation

## 2019-05-12 DIAGNOSIS — Z853 Personal history of malignant neoplasm of breast: Secondary | ICD-10-CM | POA: Diagnosis not present

## 2019-05-12 DIAGNOSIS — M810 Age-related osteoporosis without current pathological fracture: Secondary | ICD-10-CM | POA: Diagnosis not present

## 2019-05-12 DIAGNOSIS — C50412 Malignant neoplasm of upper-outer quadrant of left female breast: Secondary | ICD-10-CM | POA: Diagnosis not present

## 2019-05-12 DIAGNOSIS — Z170 Estrogen receptor positive status [ER+]: Secondary | ICD-10-CM | POA: Diagnosis not present

## 2019-05-12 NOTE — Progress Notes (Signed)
[facility] up:    Deborah Lowe, [place] [address]   DIAGNOSIS: Cancer Staging Carcinoma of upper-outer quadrant of left female breast [facility]) Staging form: Breast, AJCC 7th Edition - Clinical: No stage assigned - Unsigned - Pathologic: Stage IA (T1c, N0, cM0) - Signed by Rulon Eisenmenger, MD on 02/03/2014   SUMMARY OF ONCOLOGIC HISTORY: Oncology History  Carcinoma of upper-outer quadrant of left female breast ([place])  12/25/2010 Surgery   Left breast lumpectomy invasive ductal carcinoma with high-grade DCIS ER 80% PR 10% Ki-67 60% T1 a. N0 M0 stage IA (IDC measured 0.1 cm)   02/04/2011 - 03/06/2011 Radiation Therapy   Radiation therapy to lumpectomy site by Dr. Valere Dross   04/26/2011 - 05/11/2017 Anti-estrogen oral therapy   Arimidex 1 mg daily. Plan is to treat her for 5 years     CURRENT THERAPY: observation  INTERVAL HISTORY: Deborah Lowe 78 y.o. female returns for evaluation of her h/o stage IA ER/PR positive invasive breast cancer.    Since her last visit she underwent a bilateral screening 3d mammogram that showed no evidence of malignancy and breast density category C.  She underwent bone density testing on 06/08/2018 that showed continued osteoporosis in her spine with a T score of -3.6.  She is taking calcium and vitamin d daily.    Deborah Lowe is doing well.  She denies any new issues today.  She continues to see her PCP regularly.  She exercises regularly by walking, walking up and down the stairs.  She does stretching on her computer every morning.  She is up to date with colon and skin cancer screenings.  She sees her gynecologist regularly.  She is feeling well today.     Patient Active Problem List   Diagnosis Date Noted  . Headache(784.0) 03/07/2013  . Hypertension 08/13/2011  . Carcinoma of upper-outer quadrant of left female breast ([place]) 02/20/2011    is allergic to avelox [moxifloxacin hcl in  nacl].  MEDICAL HISTORY: Past Medical History:  Diagnosis Date  . Breast cancer ([place]) 2012   s/p lumpectomy of left breast with XRT  . Hypertension   . Palpitations    on beta blocker therapy  . Ringing in ears   . Wears glasses     SURGICAL HISTORY: Past Surgical History:  Procedure Laterality Date  . BREAST LUMPECTOMY Left   . BREAST SURGERY    . CARDIAC CATHETERIZATION  01/29/2007  . CARDIOVASCULAR STRESS TEST  07/19/2002   EF 84%  . CHOLECYSTECTOMY    . TEAR DUCT PROBING      SOCIAL HISTORY: Social History   Socioeconomic History  . Marital status: Married    Spouse name: terry  . Number of children: 1  . Years of education: 34  . Highest education level: Not on file  Occupational History  . Occupation: retired  Tobacco Use  . Smoking status: Never Smoker  . Smokeless tobacco: Never Used  Substance and Sexual Activity  . Alcohol use: No  . Drug use: No  . Sexual activity: Yes    Birth control/protection: Post-menopausal  Other Topics Concern  . Not on file  Social History Narrative  . Not on file   Social Determinants of Health   Financial Resource Strain:   . Difficulty of Paying Living Expenses: Not on file  Food Insecurity:   . Worried About Charity fundraiser in the Last Year: Not on file  .  Ran Out of Food in the Last Year: Not on file  Transportation Needs:   . Lack of Transportation (Medical): Not on file  . Lack of Transportation (Non-Medical): Not on file  Physical Activity:   . Days of Exercise per Week: Not on file  . Minutes of Exercise per Session: Not on file  Stress:   . Feeling of Stress : Not on file  Social Connections:   . Frequency of Communication with Friends and Family: Not on file  . Frequency of Social Gatherings with Friends and Family: Not on file  . Attends Religious Services: Not on file  . Active Member of Clubs or Organizations: Not on file  . Attends Archivist Meetings: Not on file  . Marital Status:  Not on file  Intimate Partner Violence:   . Fear of Current or Ex-Partner: Not on file  . Emotionally Abused: Not on file  . Physically Abused: Not on file  . Sexually Abused: Not on file    FAMILY HISTORY: Family History  Problem Relation Age of Onset  . Colon cancer Mother   . Heart disease Father   . Bone cancer Brother     Review of Systems  Constitutional: Negative for appetite change, chills, fatigue, fever and unexpected weight change.  HENT:   Negative for hearing loss, lump/mass, sore throat and trouble swallowing.   Eyes: Negative for eye problems and icterus.  Respiratory: Negative for chest tightness, cough and shortness of breath.   Cardiovascular: Negative for chest pain, leg swelling and palpitations.  Gastrointestinal: Negative for abdominal distention, abdominal pain, constipation, diarrhea, nausea and vomiting.  Endocrine: Negative for hot flashes.  Genitourinary: Negative for difficulty urinating.   Musculoskeletal: Negative for arthralgias.  Skin: Negative for itching and rash.  Neurological: Negative for dizziness, extremity weakness, headaches and numbness.  Hematological: Negative for adenopathy. Does not bruise/bleed easily.  Psychiatric/Behavioral: Negative for depression. The patient is not nervous/anxious.       PHYSICAL EXAMINATION  ECOG PERFORMANCE STATUS: 0  Vitals:   05/12/19 1114  BP: (!) 156/77  Pulse: 63  Resp: 17  Temp: 98 F (36.7 C)  SpO2: 100%    Physical Exam Constitutional:      General: She is not in acute distress.    Appearance: Normal appearance.  HENT:     Head: Normocephalic and atraumatic.     Mouth/Throat:     Mouth: Mucous membranes are moist.     Pharynx: No oropharyngeal exudate.  Eyes:     General: No scleral icterus.    Pupils: Pupils are equal, round, and reactive to light.  Cardiovascular:     Rate and Rhythm: Normal rate and regular rhythm.     Heart sounds: Normal heart sounds.  Pulmonary:      Effort: Pulmonary effort is normal.     Breath sounds: Normal breath sounds.     Comments: Left breast s/p lumpectomy; no sign of local recurrence, right breast benign Abdominal:     General: Abdomen is flat. There is no distension.     Palpations: Abdomen is soft.     Tenderness: There is no abdominal tenderness.  Musculoskeletal:        General: No swelling.     Cervical back: Neck supple.  Lymphadenopathy:     Cervical: No cervical adenopathy.  Skin:    General: Skin is warm and dry.     Capillary Refill: Capillary refill takes less than 2 seconds.     Findings:  No rash.  Neurological:     General: No focal deficit present.     Mental Status: She is alert and oriented to person, place, and time.  Psychiatric:        Mood and Affect: Mood normal.        Behavior: Behavior normal.     LABORATORY DATA:  CBC    Component Value Date/Time   WBC 5.9 01/11/2019 1038   WBC 6.7 05/17/2015 1107   WBC 5.7 12/07/2012 1029   RBC 4.82 01/11/2019 1038   RBC 4.60 05/17/2015 1107   RBC 4.74 12/07/2012 1029   HGB 14.0 01/11/2019 1038   HGB 13.5 05/17/2015 1107   HCT 42.4 01/11/2019 1038   HCT 39.4 05/17/2015 1107   PLT 313 01/11/2019 1038   MCV 88 01/11/2019 1038   MCV 85.7 05/17/2015 1107   MCH 29.0 01/11/2019 1038   MCH 29.3 05/17/2015 1107   MCH 29.4 12/20/2010 1209   MCHC 33.0 01/11/2019 1038   MCHC 34.3 05/17/2015 1107   MCHC 34.0 12/07/2012 1029   RDW 12.4 01/11/2019 1038   RDW 12.7 05/17/2015 1107   LYMPHSABS 1.9 05/17/2015 1107   MONOABS 0.6 05/17/2015 1107   EOSABS 0.1 05/17/2015 1107   BASOSABS 0.0 05/17/2015 1107    CMP     Component Value Date/Time   NA 134 02/16/2019 0911   NA 133 (L) 05/17/2015 1107   K 3.9 02/16/2019 0911   K 3.6 05/17/2015 1107   CL 93 (L) 02/16/2019 0911   CL 97 (L) 08/04/2012 1010   CO2 26 02/16/2019 0911   CO2 28 05/17/2015 1107   GLUCOSE 90 02/16/2019 0911   GLUCOSE 101 (H) 12/05/2015 0953   GLUCOSE 108 05/17/2015 1107    GLUCOSE 94 08/04/2012 1010   BUN 9 02/16/2019 0911   BUN 9.9 05/17/2015 1107   CREATININE 0.64 02/16/2019 0911   CREATININE 0.70 12/05/2015 0953   CREATININE 0.8 05/17/2015 1107   CALCIUM 10.0 02/16/2019 0911   CALCIUM 9.5 05/17/2015 1107   PROT 6.5 01/11/2019 1038   PROT 7.0 05/17/2015 1107   ALBUMIN 4.6 01/11/2019 1038   ALBUMIN 4.0 05/17/2015 1107   AST 13 01/11/2019 1038   AST 12 05/17/2015 1107   ALT 12 01/11/2019 1038   ALT 11 05/17/2015 1107   ALKPHOS 60 01/11/2019 1038   ALKPHOS 71 05/17/2015 1107   BILITOT 1.2 01/11/2019 1038   BILITOT 0.91 05/17/2015 1107   GFRNONAA 88 02/16/2019 0911   GFRAA 101 02/16/2019 0911        RADIOGRAPHIC STUDIES:         ASSESSMENT and THERAPY PLAN:   Carcinoma of upper-outer quadrant of left female breast Left breast cancer status post lumpectomy, invasive ductal carcinoma ER 80% PR 10% Ki-67 is 60% T1 C. N0 M0 stage IA status post radiation therapy and took Arimidex from December 2012-2018    Osteoporosis management: On calcium and vitamin d. Bone density repeated today and continued T score of -3.6 in the Spine.   Recommended bisphosphanate therapy.  Patient declined currently and will consider. Gave information about bone health in her AVS.  Breast cancer surveillance: Normal mammogram in 03/2019 Breast density category C. No sign of recurrence Reviewed healthy diet and exercise To continue with f/u with PCP for her routine cancer screenings, health maintenance  RTC in 1 year for LTS follow up Mammogram in 03/2020 Bone density testing 05/2020   No orders of the defined types were placed in this encounter.  All questions were answered. The patient knows to call the clinic with any problems, questions or concerns. We can certainly see the patient much sooner if necessary.  A total of (15) minutes of face-to-face time was spent with this patient with greater than 50% of that time in counseling and  care-coordination.  This note was electronically signed. Scot Dock, NP 05/12/2019

## 2019-05-12 NOTE — Patient Instructions (Signed)
Alendronate tablets What is this medicine? ALENDRONATE (a LEN droe nate) slows calcium loss from bones. It helps to make normal healthy bone and to slow bone loss in people with Paget's disease and osteoporosis. It may be used in others at risk for bone loss. This medicine may be used for other purposes; ask your health care provider or pharmacist if you have questions. COMMON BRAND NAME(S): Fosamax What should I tell my health care provider before I take this medicine? They need to know if you have any of these conditions:  dental disease  esophagus, stomach, or intestine problems, like acid reflux or GERD  kidney disease  low blood calcium  low vitamin D  problems sitting or standing 30 minutes  trouble swallowing  an unusual or allergic reaction to alendronate, other medicines, foods, dyes, or preservatives  pregnant or trying to get pregnant  breast-feeding How should I use this medicine? You must take this medicine exactly as directed or you will lower the amount of the medicine you absorb into your body or you may cause yourself harm. Take this medicine by mouth first thing in the morning, after you are up for the day. Do not eat or drink anything before you take your medicine. Swallow the tablet with a full glass (6 to 8 fluid ounces) of plain water. Do not take this medicine with any other drink. Do not chew or crush the tablet. After taking this medicine, do not eat breakfast, drink, or take any medicines or vitamins for at least 30 minutes. Sit or stand up for at least 30 minutes after you take this medicine; do not lie down. Do not take your medicine more often than directed. Talk to your pediatrician regarding the use of this medicine in children. Special care may be needed. Overdosage: If you think you have taken too much of this medicine contact a poison control center or emergency room at once. NOTE: This medicine is only for you. Do not share this medicine with  others. What if I miss a dose? If you miss a dose, do not take it later in the day. Continue your normal schedule starting the next morning. Do not take double or extra doses. What may interact with this medicine?  aluminum hydroxide  antacids  aspirin  calcium supplements  drugs for inflammation like ibuprofen, naproxen, and others  iron supplements  magnesium supplements  vitamins with minerals This list may not describe all possible interactions. Give your health care provider a list of all the medicines, herbs, non-prescription drugs, or dietary supplements you use. Also tell them if you smoke, drink alcohol, or use illegal drugs. Some items may interact with your medicine. What should I watch for while using this medicine? Visit your doctor or health care professional for regular checks ups. It may be some time before you see benefit from this medicine. Do not stop taking your medicine except on your doctor's advice. Your doctor or health care professional may order blood tests and other tests to see how you are doing. You should make sure you get enough calcium and vitamin D while you are taking this medicine, unless your doctor tells you not to. Discuss the foods you eat and the vitamins you take with your health care professional. Some people who take this medicine have severe bone, joint, and/or muscle pain. This medicine may also increase your risk for a broken thigh bone. Tell your doctor right away if you have pain in your upper leg or  groin. Tell your doctor if you have any pain that does not go away or that gets worse. This medicine can make you more sensitive to the sun. If you get a rash while taking this medicine, sunlight may cause the rash to get worse. Keep out of the sun. If you cannot avoid being in the sun, wear protective clothing and use sunscreen. Do not use sun lamps or tanning beds/booths. What side effects may I notice from receiving this medicine? Side effects  that you should report to your doctor or health care professional as soon as possible:  allergic reactions like skin rash, itching or hives, swelling of the face, lips, or tongue  black or tarry stools  bone, muscle or joint pain  changes in vision  chest pain  heartburn or stomach pain  jaw pain, especially after dental work  pain or trouble when swallowing  redness, blistering, peeling or loosening of the skin, including inside the mouth Side effects that usually do not require medical attention (report to your doctor or health care professional if they continue or are bothersome):  changes in taste  diarrhea or constipation  eye pain or itching  headache  nausea or vomiting  stomach gas or fullness This list may not describe all possible side effects. Call your doctor for medical advice about side effects. You may report side effects to FDA at 1-800-FDA-1088. Where should I keep my medicine? Keep out of the reach of children. Store at room temperature of 15 and 30 degrees C (59 and 86 degrees F). Throw away any unused medicine after the expiration date. NOTE: This sheet is a summary. It may not cover all possible information. If you have questions about this medicine, talk to your doctor, pharmacist, or health care provider.  2020 Elsevier/Gold Standard (2010-11-08 08:56:09) Bone Health Bones protect organs, store calcium, anchor muscles, and support the whole body. Keeping your bones strong is important, especially as you get older. You can take actions to help keep your bones strong and healthy. Why is keeping my bones healthy important?  Keeping your bones healthy is important because your body constantly replaces bone cells. Cells get old, and new cells take their place. As we age, we lose bone cells because the body may not be able to make enough new cells to replace the old cells. The amount of bone cells and bone tissue you have is referred to as bone mass. The  higher your bone mass, the stronger your bones. The aging process leads to an overall loss of bone mass in the body, which can increase the likelihood of:  Joint pain and stiffness.  Broken bones.  A condition in which the bones become weak and brittle (osteoporosis). A large decline in bone mass occurs in older adults. In women, it occurs about the time of menopause. What actions can I take to keep my bones healthy? Good health habits are important for maintaining healthy bones. This includes eating nutritious foods and exercising regularly. To have healthy bones, you need to get enough of the right minerals and vitamins. Most nutrition experts recommend getting these nutrients from the foods that you eat. In some cases, taking supplements may also be recommended. Doing certain types of exercise is also important for bone health. What are the nutritional recommendations for healthy bones?  Eating a well-balanced diet with plenty of calcium and vitamin D will help to protect your bones. Nutritional recommendations vary from person to person. Ask your health care provider  what is healthy for you. Here are some general guidelines. Get enough calcium Calcium is the most important (essential) mineral for bone health. Most people can get enough calcium from their diet, but supplements may be recommended for people who are at risk for osteoporosis. Good sources of calcium include:  Dairy products, such as low-fat or nonfat milk, cheese, and yogurt.  Dark green leafy vegetables, such as bok choy and broccoli.  Calcium-fortified foods, such as orange juice, cereal, bread, soy beverages, and tofu products.  Nuts, such as almonds. Follow these recommended amounts for daily calcium intake:  Children, age 707-3: 700 mg.  Children, age 70-8: 1,000 mg.  Children, age 3-13: 1,300 mg.  Teens, age 706-18: 1,300 mg.  Adults, age 83-50: 1,000 mg.  Adults, age 60-70: ? Men: 1,000 mg. ? Women: 1,200  mg.  Adults, age 78 or older: 1,200 mg.  Pregnant and breastfeeding females: ? Teens: 1,300 mg. ? Adults: 1,000 mg. Get enough vitamin D Vitamin D is the most essential vitamin for bone health. It helps the body absorb calcium. Sunlight stimulates the skin to make vitamin D, so be sure to get enough sunlight. If you live in a cold climate or you do not get outside often, your health care provider may recommend that you take vitamin D supplements. Good sources of vitamin D in your diet include:  Egg yolks.  Saltwater fish.  Milk and cereal fortified with vitamin D. Follow these recommended amounts for daily vitamin D intake:  Children and teens, age 707-18: 600 international units.  Adults, age 75 or younger: 400-800 international units.  Adults, age 57 or older: 800-1,000 international units. Get other important nutrients Other nutrients that are important for bone health include:  Phosphorus. This mineral is found in meat, poultry, dairy foods, nuts, and legumes. The recommended daily intake for adult men and adult women is 700 mg.  Magnesium. This mineral is found in seeds, nuts, dark green vegetables, and legumes. The recommended daily intake for adult men is 400-420 mg. For adult women, it is 310-320 mg.  Vitamin K. This vitamin is found in green leafy vegetables. The recommended daily intake is 120 mg for adult men and 90 mg for adult women. What type of physical activity is best for building and maintaining healthy bones? Weight-bearing and strength-building activities are important for building and maintaining healthy bones. Weight-bearing activities cause muscles and bones to work against gravity. Strength-building activities increase the strength of the muscles that support bones. Weight-bearing and muscle-building activities include:  Walking and hiking.  Jogging and running.  Dancing.  Gym exercises.  Lifting weights.  Tennis and racquetball.  Climbing  stairs.  Aerobics. Adults should get at least 30 minutes of moderate physical activity on most days. Children should get at least 60 minutes of moderate physical activity on most days. Ask your health care provider what type of exercise is best for you. How can I find out if my bone mass is low? Bone mass can be measured with an X-ray test called a bone mineral density (BMD) test. This test is recommended for all women who are age 67 or older. It may also be recommended for:  Men who are age 39 or older.  People who are at risk for osteoporosis because of: ? Having bones that break easily. ? Having a long-term disease that weakens bones, such as kidney disease or rheumatoid arthritis. ? Having menopause earlier than normal. ? Taking medicine that weakens bones, such as steroids,  thyroid hormones, or hormone treatment for breast cancer or prostate cancer. ? Smoking. ? Drinking three or more alcoholic drinks a day. If you find that you have a low bone mass, you may be able to prevent osteoporosis or further bone loss by changing your diet and lifestyle. Where can I find more information? For more information, check out the following websites:  [employer]: AviationTales.fr  [employer] of Health: www.bones.SouthExposed.es  International Osteoporosis Foundation: Administrator.iofbonehealth.org Summary  The aging process leads to an overall loss of bone mass in the body, which can increase the likelihood of broken bones and osteoporosis.  Eating a well-balanced diet with plenty of calcium and vitamin D will help to protect your bones.  Weight-bearing and strength-building activities are also important for building and maintaining strong bones.  Bone mass can be measured with an X-ray test called a bone mineral density (BMD) test. This information is not intended to replace advice given to you by your health care provider. Make sure you discuss any questions you have with  your health care provider. Document Released: 08/02/2003 Document Revised: 06/08/2017 Document Reviewed: 06/08/2017 Elsevier Patient Education  2020 [employer].

## 2019-05-12 NOTE — Assessment & Plan Note (Signed)
Left breast cancer status post lumpectomy, invasive ductal carcinoma ER 80% PR 10% Ki-67 is 60% T1 C. N0 M0 stage IA status post radiation therapy and took Arimidex from December 2012-2018    Osteoporosis management: On calcium and vitamin d. Bone density repeated today and continued T score of -3.6 in the Spine.   Recommended bisphosphanate therapy.  Patient declined currently and will consider. Gave information about bone health in her AVS.  Breast cancer surveillance: Normal mammogram in 03/2019 Breast density category C. No sign of recurrence Reviewed healthy diet and exercise To continue with f/u with PCP for her routine cancer screenings, health maintenance  RTC in 1 year for LTS follow up Mammogram in 03/2020 Bone density testing 05/2020

## 2019-05-13 ENCOUNTER — Telehealth: Payer: Self-pay | Admitting: Adult Health

## 2019-05-13 NOTE — Telephone Encounter (Signed)
I talk with patient regarding schedule  

## 2019-07-04 ENCOUNTER — Ambulatory Visit: Payer: Medicare Other

## 2019-08-16 DIAGNOSIS — L578 Other skin changes due to chronic exposure to nonionizing radiation: Secondary | ICD-10-CM | POA: Diagnosis not present

## 2019-08-16 DIAGNOSIS — L821 Other seborrheic keratosis: Secondary | ICD-10-CM | POA: Diagnosis not present

## 2019-08-16 DIAGNOSIS — D485 Neoplasm of uncertain behavior of skin: Secondary | ICD-10-CM | POA: Diagnosis not present

## 2019-08-24 DIAGNOSIS — N952 Postmenopausal atrophic vaginitis: Secondary | ICD-10-CM | POA: Diagnosis not present

## 2019-08-25 DIAGNOSIS — M79675 Pain in left toe(s): Secondary | ICD-10-CM | POA: Diagnosis not present

## 2019-08-25 DIAGNOSIS — M779 Enthesopathy, unspecified: Secondary | ICD-10-CM | POA: Diagnosis not present

## 2019-09-08 DIAGNOSIS — N904 Leukoplakia of vulva: Secondary | ICD-10-CM | POA: Diagnosis not present

## 2019-09-08 DIAGNOSIS — N9089 Other specified noninflammatory disorders of vulva and perineum: Secondary | ICD-10-CM | POA: Diagnosis not present

## 2019-09-14 DIAGNOSIS — L900 Lichen sclerosus et atrophicus: Secondary | ICD-10-CM | POA: Diagnosis not present

## 2019-10-03 DIAGNOSIS — L900 Lichen sclerosus et atrophicus: Secondary | ICD-10-CM | POA: Diagnosis not present

## 2019-12-31 ENCOUNTER — Other Ambulatory Visit: Payer: Self-pay | Admitting: Nurse Practitioner

## 2020-01-03 DIAGNOSIS — L570 Actinic keratosis: Secondary | ICD-10-CM | POA: Diagnosis not present

## 2020-01-09 ENCOUNTER — Other Ambulatory Visit: Payer: Self-pay | Admitting: Nurse Practitioner

## 2020-01-09 DIAGNOSIS — H6121 Impacted cerumen, right ear: Secondary | ICD-10-CM | POA: Diagnosis not present

## 2020-01-09 NOTE — Progress Notes (Signed)
CARDIOLOGY OFFICE NOTE  Date:  01/17/2020    [name] Date of Birth: 1941-04-11 Medical Record #[id number]  PCP:  Sharion Balloon, FNP  Cardiologist:  Servando Snare Nahser  Chief Complaint  Patient presents with  . Follow-up    Seen for Dr. Acie Fredrickson    History of Present Illness: NHI BUTRUM is a 79 y.o. female who presents today for a one year check. Seen for Dr. Acie Fredrickson - former patient of Dr. Susa Simmonds. She predominantly follows with me.   She has had prior breast cancer, HTN, palpitations and has normal coronaries and renal arteries from a 2008 evaluation.   Had a work in visit back in [place] 2018 thatI saw herfor- for palpitations - felt to have been triggered by prednisone.Last visit back in August of 2020 and she was doing well. Was exercising at home due to the pandemic.   Comes in today. Here alone. Doing well. Not really having palpitations. HR is a little low but she is not dizzy or lightheaded. No syncope. She remains on Toprol. Needs medicines refilled. She is exercising inside her house. Weight is stable. Feels like she is doing well.   Past Medical History:  Diagnosis Date  . Breast cancer ([place]) 2012   s/p lumpectomy of left breast with XRT  . Hypertension   . Palpitations    on beta blocker therapy  . Ringing in ears   . Wears glasses     Past Surgical History:  Procedure Laterality Date  . BREAST LUMPECTOMY Left   . BREAST SURGERY    . CARDIAC CATHETERIZATION  01/29/2007  . CARDIOVASCULAR STRESS TEST  07/19/2002   EF 84%  . CHOLECYSTECTOMY    . TEAR DUCT PROBING       Medications: Current Meds  Medication Sig  . calcium-vitamin D (OSCAL WITH D) 250-125 MG-UNIT tablet Take 1 tablet by mouth daily.  Marland Kitchen losartan-hydrochlorothiazide (HYZAAR) 100-12.5 MG tablet Take 1 tablet by mouth daily.  . TOPROL XL 25 MG 24 hr tablet TAKE 1 TABLET BY MOUTH EVERY DAY  . [DISCONTINUED] losartan-hydrochlorothiazide (HYZAAR) 100-12.5 MG tablet Take 1  tablet by mouth daily. Please keep upcoming appt in August for future refills. Thank you     Allergies: Allergies  Allergen Reactions  . Avelox [Moxifloxacin Hcl In Nacl] Anaphylaxis    Social History: The patient  reports that she has never smoked. She has never used smokeless tobacco. She reports that she does not drink alcohol and does not use drugs.   Family History: The patient's family history includes Bone cancer in her brother; Colon cancer in her mother; Heart disease in her father.   Review of Systems: Please see the history of present illness.   All other systems are reviewed and negative.   Physical Exam: VS:  BP 120/80   Pulse (!) 54   Ht 5' 1.5" (1.562 m)   Wt 114 lb 6.4 oz (51.9 kg)   SpO2 99%   BMI 21.27 kg/m  .  BMI Body mass index is 21.27 kg/m.  Wt Readings from Last 3 Encounters:  01/17/20 114 lb 6.4 oz (51.9 kg)  05/12/19 115 lb 1.6 oz (52.2 kg)  01/11/19 115 lb 1.9 oz (52.2 kg)    General: Pleasant. Alert and in no acute distress. She looks younger than her stated age.  Cardiac: Regular rate and rhythm. No murmurs, rubs, or gallops. No edema.  Respiratory:  Lungs are clear to auscultation bilaterally with normal  work of breathing.  GI: Soft and nontender.  MS: No deformity or atrophy. Gait and ROM intact.  Skin: Warm and dry. Color is normal.  Neuro:  Strength and sensation are intact and no gross focal deficits noted.  Psych: Alert, appropriate and with normal affect.   LABORATORY DATA:  EKG:  EKG is ordered today.  Personally reviewed by me. This demonstrates sinus bradycardia - with non specific ST and T wave changes.  Lab Results  Component Value Date   WBC 5.9 01/11/2019   HGB 14.0 01/11/2019   HCT 42.4 01/11/2019   PLT 313 01/11/2019   GLUCOSE 90 02/16/2019   CHOL 189 01/11/2019   TRIG 62 01/11/2019   HDL 70 01/11/2019   LDLDIRECT 140.5 12/07/2012   LDLCALC 107 (H) 01/11/2019   ALT 12 01/11/2019   AST 13 01/11/2019   NA 134  02/16/2019   K 3.9 02/16/2019   CL 93 (L) 02/16/2019   CREATININE 0.64 02/16/2019   BUN 9 02/16/2019   CO2 26 02/16/2019   TSH 2.620 09/02/2016   HGBA1C 5.6 05/30/2013     BNP (last 3 results) No results for input(s): BNP in the last 8760 hours.  ProBNP (last 3 results) No results for input(s): PROBNP in the last 8760 hours.   Other Studies Reviewed Today:   Assessment/Plan:  1. Palpitations - not really an issue with low dose beta blocker. We will continue this.   2. HTN - great control here and at home. Would continue Hyzaar and Toprol.   3. HLD - managed with diet. Lab today.   4. Resting bradycardia - not symptomatic - will continue with low dose beta blocker.   Current medicines are reviewed with the patient today.  The patient does not have concerns regarding medicines other than what has been noted above.  The following changes have been made:  See above.  Labs/ tests ordered today include:    Orders Placed This Encounter  Procedures  . Basic metabolic panel  . CBC  . Hepatic function panel  . Lipid panel  . TSH     Disposition:   FU with Dr. Acie Fredrickson in one year.      Patient is agreeable to this plan and will call if any problems develop in the interim.   SignedTruitt Merle, NP  01/17/2020 11:05 AM  [facility] [address] [place], [place]  [address] Phone: [phone] Fax: [phone]

## 2020-01-17 ENCOUNTER — Other Ambulatory Visit: Payer: Self-pay

## 2020-01-17 ENCOUNTER — Encounter: Payer: Self-pay | Admitting: Nurse Practitioner

## 2020-01-17 ENCOUNTER — Other Ambulatory Visit (INDEPENDENT_AMBULATORY_CARE_PROVIDER_SITE_OTHER): Payer: Medicare Other | Admitting: *Deleted

## 2020-01-17 ENCOUNTER — Ambulatory Visit (INDEPENDENT_AMBULATORY_CARE_PROVIDER_SITE_OTHER): Payer: Medicare Other | Admitting: Nurse Practitioner

## 2020-01-17 VITALS — BP 120/80 | HR 54 | Ht 61.5 in | Wt 114.4 lb

## 2020-01-17 DIAGNOSIS — R002 Palpitations: Secondary | ICD-10-CM

## 2020-01-17 DIAGNOSIS — E7800 Pure hypercholesterolemia, unspecified: Secondary | ICD-10-CM | POA: Diagnosis not present

## 2020-01-17 DIAGNOSIS — I10 Essential (primary) hypertension: Secondary | ICD-10-CM

## 2020-01-17 LAB — BASIC METABOLIC PANEL
BUN/Creatinine Ratio: 15 (ref 12–28)
BUN: 9 mg/dL (ref 8–27)
CO2: 27 mmol/L (ref 20–29)
Calcium: 9.5 mg/dL (ref 8.7–10.3)
Chloride: 92 mmol/L — ABNORMAL LOW (ref 96–106)
Creatinine, Ser: 0.61 mg/dL (ref 0.57–1.00)
GFR calc Af Amer: 102 mL/min/{1.73_m2} (ref >59)
GFR calc non Af Amer: 88 mL/min/{1.73_m2} (ref >59)
Glucose: 90 mg/dL (ref 65–99)
Potassium: 3.7 mmol/L (ref 3.5–5.2)
Sodium: 133 mmol/L — ABNORMAL LOW (ref 134–144)

## 2020-01-17 LAB — HEPATIC FUNCTION PANEL
ALT: 12 IU/L (ref 0–32)
AST: 12 IU/L (ref 0–40)
Albumin: 4.5 g/dL (ref 3.7–4.7)
Alkaline Phosphatase: 67 IU/L (ref 48–121)
Bilirubin Total: 1.1 mg/dL (ref 0.0–1.2)
Bilirubin, Direct: 0.28 mg/dL (ref 0.00–0.40)
Total Protein: 6.3 g/dL (ref 6.0–8.5)

## 2020-01-17 LAB — LIPID PANEL
Chol/HDL Ratio: 2.8 ratio (ref 0.0–4.4)
Cholesterol, Total: 206 mg/dL — ABNORMAL HIGH (ref 100–199)
HDL: 73 mg/dL (ref >39)
LDL Chol Calc (NIH): 121 mg/dL — ABNORMAL HIGH (ref 0–99)
Triglycerides: 67 mg/dL (ref 0–149)
VLDL Cholesterol Cal: 12 mg/dL (ref 5–40)

## 2020-01-17 LAB — CBC
Hematocrit: 40.3 % (ref 34.0–46.6)
Hemoglobin: 13.2 g/dL (ref 11.1–15.9)
MCH: 28.9 pg (ref 26.6–33.0)
MCHC: 32.8 g/dL (ref 31.5–35.7)
MCV: 88 fL (ref 79–97)
Platelets: 301 10*3/uL (ref 150–450)
RBC: 4.56 x10E6/uL (ref 3.77–5.28)
RDW: 12.7 % (ref 11.7–15.4)
WBC: 6.7 10*3/uL (ref 3.4–10.8)

## 2020-01-17 LAB — TSH: TSH: 2.94 u[IU]/mL (ref 0.450–4.500)

## 2020-01-17 MED ORDER — LOSARTAN POTASSIUM-HCTZ 100-12.5 MG PO TABS: 1.0000 | ORAL_TABLET | Freq: Every day | ORAL | 3 refills | Status: DC

## 2020-01-17 NOTE — Patient Instructions (Addendum)
After Visit Summary:  We will be checking the following labs today - BMET, CBC, HPF, Lipids and TSH.    Medication Instructions:    Continue with your current medicines.    If you need a refill on your cardiac medications before your next appointment, please call your pharmacy.     Testing/Procedures To Be Arranged:  N/A  Follow-Up:   See Dr. Acie Fredrickson in one year -  You will receive a reminder letter in the mail two months in advance. If you don't receive a letter, please call our office to schedule the follow-up appointment.    At [facility], you and your health needs are our priority.  As part of our continuing mission to provide you with exceptional heart care, we have created designated Provider Care Teams.  These Care Teams include your primary Cardiologist (physician) and Advanced Practice Providers (APPs -  Physician Assistants and Nurse Practitioners) who all work together to provide you with the care you need, when you need it.  Special Instructions:  . Stay safe, wash your hands for at least 20 seconds and wear a mask when needed.  . It was good to talk with you today.    Call the [place] office at [phone] if you have any questions, problems or concerns.

## 2020-01-17 NOTE — Progress Notes (Signed)
Error

## 2020-02-18 DIAGNOSIS — Z23 Encounter for immunization: Secondary | ICD-10-CM | POA: Diagnosis not present

## 2020-03-27 ENCOUNTER — Ambulatory Visit (INDEPENDENT_AMBULATORY_CARE_PROVIDER_SITE_OTHER): Payer: Medicare Other

## 2020-03-27 ENCOUNTER — Other Ambulatory Visit: Payer: Self-pay

## 2020-03-27 DIAGNOSIS — Z23 Encounter for immunization: Secondary | ICD-10-CM | POA: Diagnosis not present

## 2020-05-17 ENCOUNTER — Telehealth: Payer: Self-pay | Admitting: Adult Health

## 2020-05-17 NOTE — Telephone Encounter (Signed)
Left message to reschedule appointment due to provider day off. 

## 2020-05-25 DIAGNOSIS — Z1231 Encounter for screening mammogram for malignant neoplasm of breast: Secondary | ICD-10-CM | POA: Diagnosis not present

## 2020-06-05 ENCOUNTER — Encounter: Payer: Medicare Other | Admitting: Adult Health

## 2020-06-06 ENCOUNTER — Encounter: Payer: Self-pay | Admitting: Adult Health

## 2020-06-06 ENCOUNTER — Inpatient Hospital Stay: Payer: Medicare Other | Attending: Adult Health | Admitting: Adult Health

## 2020-06-06 ENCOUNTER — Other Ambulatory Visit: Payer: Self-pay

## 2020-06-06 VITALS — BP 156/59 | HR 60 | Temp 97.7°F | Resp 18 | Ht 61.5 in | Wt 114.7 lb

## 2020-06-06 DIAGNOSIS — C50412 Malignant neoplasm of upper-outer quadrant of left female breast: Secondary | ICD-10-CM | POA: Diagnosis not present

## 2020-06-06 DIAGNOSIS — M810 Age-related osteoporosis without current pathological fracture: Secondary | ICD-10-CM | POA: Diagnosis not present

## 2020-06-06 DIAGNOSIS — Z170 Estrogen receptor positive status [ER+]: Secondary | ICD-10-CM

## 2020-06-06 DIAGNOSIS — Z853 Personal history of malignant neoplasm of breast: Secondary | ICD-10-CM | POA: Insufficient documentation

## 2020-06-06 NOTE — Progress Notes (Signed)
CLINIC:  Survivorship   REASON FOR VISIT:  Routine follow-up for history of breast cancer.   BRIEF ONCOLOGIC HISTORY:  Oncology History  Carcinoma of upper-outer quadrant of left female breast ([place])  12/25/2010 Surgery   Left breast lumpectomy invasive ductal carcinoma with high-grade DCIS ER 80% PR 10% Ki-67 60% T1 a. N0 M0 stage IA (IDC measured 0.1 cm)   02/04/2011 - 03/06/2011 Radiation Therapy   Radiation therapy to lumpectomy site by Dr. Valere Dross   04/26/2011 - 05/11/2017 Anti-estrogen oral therapy   Arimidex 1 mg daily. Plan is to treat her for 5 years      INTERVAL HISTORY:  [name] presents to the [facility] today for routine follow-up for her history of breast cancer.  Overall, she reports feeling quite well. She has had no health changes since we saw her last.  Her most recent screening mammogram was completed on 05/25/2020 and showed no evidence of malignancy and breast density category B.    Her most recent bone density was completed in 05/2018 and she had osteoporosis.  Her bone density improved from prior.  She is taking calcium, vitamin d, and performs exercise.  She enjoys her exercise bike, and watches her TV shows and rides her bike.    She sees her PCP regularly.  She notes her GI doc retired, and she will need to see a different GI for her colonoscopy.  She sees dermatology regularly for skin checks.  She is a never smoker, and doesn't drink ETOH.  REVIEW OF SYSTEMS:  Review of Systems  Constitutional: Negative for appetite change, chills, fatigue, fever and unexpected weight change.  HENT:   Negative for hearing loss, lump/mass and trouble swallowing.   Eyes: Negative for eye problems and icterus.  Respiratory: Negative for chest tightness, cough and shortness of breath.   Cardiovascular: Negative for chest pain, leg swelling and palpitations.  Gastrointestinal: Negative for abdominal distention, abdominal pain, constipation, diarrhea, nausea and  vomiting.  Endocrine: Negative for hot flashes.  Genitourinary: Negative for difficulty urinating.   Musculoskeletal: Negative for arthralgias.  Skin: Negative for itching and rash.  Neurological: Negative for dizziness, extremity weakness, headaches and numbness.  Hematological: Negative for adenopathy. Does not bruise/bleed easily.  Psychiatric/Behavioral: Negative for depression. The patient is not nervous/anxious.   Breast: Denies any new nodularity, masses, tenderness, nipple changes, or nipple discharge.       PAST MEDICAL/SURGICAL HISTORY:  Past Medical History:  Diagnosis Date  . Breast cancer ([place]) 2012   s/p lumpectomy of left breast with XRT  . Hypertension   . Palpitations    on beta blocker therapy  . Ringing in ears   . Wears glasses    Past Surgical History:  Procedure Laterality Date  . BREAST LUMPECTOMY Left   . BREAST SURGERY    . CARDIAC CATHETERIZATION  01/29/2007  . CARDIOVASCULAR STRESS TEST  07/19/2002   EF 84%  . CHOLECYSTECTOMY    . TEAR DUCT PROBING       ALLERGIES:  Allergies  Allergen Reactions  . Avelox [Moxifloxacin Hcl In Nacl] Anaphylaxis     CURRENT MEDICATIONS:  Outpatient Encounter Medications as of 06/06/2020  Medication Sig  . calcium-vitamin D (OSCAL WITH D) 250-125 MG-UNIT tablet Take 1 tablet by mouth daily.  Marland Kitchen losartan-hydrochlorothiazide (HYZAAR) 100-12.5 MG tablet Take 1 tablet by mouth daily.  . TOPROL XL 25 MG 24 hr tablet TAKE 1 TABLET BY MOUTH EVERY DAY   No facility-administered encounter medications on file  as of 06/06/2020.     ONCOLOGIC FAMILY HISTORY:  Family History  Problem Relation Age of Onset  . Colon cancer Mother   . Heart disease Father   . Bone cancer Brother     GENETIC COUNSELING/TESTING: Not at this time  SOCIAL HISTORY:  Social History   Socioeconomic History  . Marital status: Married    Spouse name: terry  . Number of children: 1  . Years of education: 73  . Highest education level:  Not on file  Occupational History  . Occupation: retired  Tobacco Use  . Smoking status: Never Smoker  . Smokeless tobacco: Never Used  Vaping Use  . Vaping Use: Never used  Substance and Sexual Activity  . Alcohol use: No  . Drug use: No  . Sexual activity: Yes    Birth control/protection: Post-menopausal  Other Topics Concern  . Not on file  Social History Narrative  . Not on file   Social Determinants of Health   Financial Resource Strain: Not on file  Food Insecurity: Not on file  Transportation Needs: Not on file  Physical Activity: Not on file  Stress: Not on file  Social Connections: Not on file  Intimate Partner Violence: Not on file      PHYSICAL EXAMINATION:  Vital Signs: Vitals:   06/06/20 1006  BP: (!) 156/59  Pulse: 60  Resp: 18  Temp: 97.7 F (36.5 C)  SpO2: 100%   Filed Weights   06/06/20 1006  Weight: 114 lb 11.2 oz (52 kg)   General: Well-nourished, well-appearing female in no acute distress.  Unaccompanied today.   HEENT: Head is normocephalic.  Pupils equal and reactive to light. Conjunctivae clear without exudate.  Sclerae anicteric. Oral mucosa is pink, moist.  Oropharynx is pink without lesions or erythema.  Lymph: No cervical, supraclavicular, or infraclavicular lymphadenopathy noted on palpation.  Cardiovascular: Regular rate and rhythm.Marland Kitchen Respiratory: Clear to auscultation bilaterally. Chest expansion symmetric; breathing non-labored.  Breast Exam:  -Left breast: No appreciable masses on palpation. No skin redness, thickening, or peau d'orange appearance; no nipple retraction or nipple discharge; mild distortion in symmetry at previous lumpectomy site well healed scar without erythema or nodularity.  -Right breast: No appreciable masses on palpation. No skin redness, thickening, or peau d'orange appearance; no nipple retraction or nipple discharge; mild distortion in symmetry at previous lumpectomy siterity. -Axilla: No axillary adenopathy  bilaterally.  GI: Abdomen soft and round; non-tender, non-distended. Bowel sounds normoactive. No hepatosplenomegaly.   GU: Deferred.  Neuro: No focal deficits. Steady gait.  Psych: Mood and affect normal and appropriate for situation.  MSK: No focal spinal tenderness to palpation, full range of motion in bilateral upper extremities Extremities: No edema. Skin: Warm and dry.  LABORATORY DATA:  None for this visit   DIAGNOSTIC IMAGING:  Most recent mammogram:      ASSESSMENT AND PLAN:  Ms.. Chiasson is a pleasant 80 y.o. female with history of Stage IA left breast invasive ductal carcinoma, ER+/PR+/HER2-, diagnosed in 2012, treated with lumpectomy, adjuvant radiation therapy, and anti-estrogen therapy with Anastrozole daily x 5 years completed in 2018.  She [facility] for surveillance and routine follow-up.   1. History of breast cancer:  Ms. Harold is currently clinically and radiographically without evidence of disease or recurrence of breast cancer. She will be due for mammogram in 04/2021; orders placed today.  We will return in 1 year for continued surveillance and monitoring.  I encouraged her to call me  with any questions or concerns before her next visit at the cancer center, and I would be happy to see her sooner, if needed.    2. Bone health:  Given Ms. Wolbert age, history of breast cancer, and her previous anti-estrogen therapy with Anastrozole, she is at risk for bone demineralization. Her last DEXA scan was in 04/2019 and showed osteoporosis, this was improved from prior.  She was given education on specific food and activities to promote bone health.  3. Cancer screening:  Due to Ms. Kochanowski history and her age, she should receive screening for skin cancers, colon cancer. She was encouraged to follow-up with her PCP for appropriate cancer screenings.   4. Health maintenance and wellness promotion: Ms. Gotsch was encouraged to consume 5-7 servings of fruits and  vegetables per day. She was also encouraged to engage in moderate to vigorous exercise for 30 minutes per day most days of the week. She was instructed to limit her alcohol consumption and continue to abstain from tobacco use.     Dispo:  -Return to cancer center in one year for LTS follow-up -Bone density testing due -Mammogram due 04/2021  Total encounter time: 20 minutes*  Wilber Bihari, NP 06/06/20 10:34 AM Medical Oncology and Hematology [facility] [place], [place] [address] Tel. [phone]    Fax. [phone]  *Total Encounter Time as defined by the Centers for Medicare and Medicaid Services includes, in addition to the face-to-face time of a patient visit (documented in the note above) non-face-to-face time: obtaining and reviewing outside history, ordering and reviewing medications, tests or procedures, care coordination (communications with other health care professionals or caregivers) and documentation in the medical record.    Note: PRIMARY CARE PROVIDER Sharion Balloon, [place] [phone]

## 2020-06-06 NOTE — Patient Instructions (Signed)

## 2020-06-07 ENCOUNTER — Telehealth: Payer: Self-pay | Admitting: Adult Health

## 2020-06-07 NOTE — Telephone Encounter (Signed)
Scheduled appt per 1/12 los. Left voicemail with appt date and time.

## 2020-06-18 ENCOUNTER — Ambulatory Visit: Payer: Medicare Other | Admitting: Family Medicine

## 2020-06-18 ENCOUNTER — Other Ambulatory Visit: Payer: Self-pay

## 2020-06-18 ENCOUNTER — Ambulatory Visit: Payer: Medicare Other

## 2020-06-18 DIAGNOSIS — Z6822 Body mass index (BMI) 22.0-22.9, adult: Secondary | ICD-10-CM | POA: Diagnosis not present

## 2020-06-18 DIAGNOSIS — N390 Urinary tract infection, site not specified: Secondary | ICD-10-CM | POA: Diagnosis not present

## 2020-06-18 DIAGNOSIS — Z124 Encounter for screening for malignant neoplasm of cervix: Secondary | ICD-10-CM | POA: Diagnosis not present

## 2020-06-18 DIAGNOSIS — R319 Hematuria, unspecified: Secondary | ICD-10-CM | POA: Diagnosis not present

## 2020-06-21 ENCOUNTER — Ambulatory Visit: Payer: Medicare Other

## 2020-08-16 DIAGNOSIS — L578 Other skin changes due to chronic exposure to nonionizing radiation: Secondary | ICD-10-CM | POA: Diagnosis not present

## 2020-08-16 DIAGNOSIS — D485 Neoplasm of uncertain behavior of skin: Secondary | ICD-10-CM | POA: Diagnosis not present

## 2020-08-16 DIAGNOSIS — L570 Actinic keratosis: Secondary | ICD-10-CM | POA: Diagnosis not present

## 2020-08-16 DIAGNOSIS — L821 Other seborrheic keratosis: Secondary | ICD-10-CM | POA: Diagnosis not present

## 2020-10-15 DIAGNOSIS — Z23 Encounter for immunization: Secondary | ICD-10-CM | POA: Diagnosis not present

## 2020-10-15 DIAGNOSIS — U071 COVID-19: Secondary | ICD-10-CM | POA: Diagnosis not present

## 2020-12-06 DIAGNOSIS — Z800 Family history of malignant neoplasm of digestive organs: Secondary | ICD-10-CM | POA: Diagnosis not present

## 2021-01-01 ENCOUNTER — Other Ambulatory Visit: Payer: Self-pay | Admitting: *Deleted

## 2021-01-01 MED ORDER — TOPROL XL 25 MG PO TB24: 25.0000 mg | ORAL_TABLET | Freq: Every day | ORAL | 3 refills | Status: DC

## 2021-01-14 ENCOUNTER — Telehealth: Payer: Self-pay | Admitting: Cardiovascular Disease

## 2021-01-14 NOTE — Telephone Encounter (Signed)
Pt is scheduled to see you 8/24.  What labs would you like pt to have prior to appt?

## 2021-01-14 NOTE — Telephone Encounter (Signed)
Patient is requesting orders to have lab work prior to her appointment on 01/16/21 with Dr. Acie Fredrickson.

## 2021-01-14 NOTE — Telephone Encounter (Signed)
Returned call to patient who states she would prefer to have lab work done on the same day as her office visit due to the distance she drives to our office. I advised her to come to her office visit in a fasting state and lab work will be done after she sees Dr. Acie Fredrickson. She verbalized understanding and agreement and thanked me for the call.

## 2021-01-16 ENCOUNTER — Ambulatory Visit (INDEPENDENT_AMBULATORY_CARE_PROVIDER_SITE_OTHER): Payer: Medicare Other | Admitting: Cardiovascular Disease

## 2021-01-16 ENCOUNTER — Other Ambulatory Visit: Payer: Self-pay

## 2021-01-16 ENCOUNTER — Encounter: Payer: Self-pay | Admitting: Cardiovascular Disease

## 2021-01-16 VITALS — BP 142/58 | HR 56 | Ht 61.5 in | Wt 113.6 lb

## 2021-01-16 DIAGNOSIS — I10 Essential (primary) hypertension: Secondary | ICD-10-CM | POA: Diagnosis not present

## 2021-01-16 DIAGNOSIS — R002 Palpitations: Secondary | ICD-10-CM | POA: Diagnosis not present

## 2021-01-16 DIAGNOSIS — E7800 Pure hypercholesterolemia, unspecified: Secondary | ICD-10-CM | POA: Diagnosis not present

## 2021-01-16 LAB — ALT: ALT: 12 IU/L (ref 0–32)

## 2021-01-16 LAB — BASIC METABOLIC PANEL
BUN/Creatinine Ratio: 14 (ref 12–28)
BUN: 10 mg/dL (ref 8–27)
CO2: 26 mmol/L (ref 20–29)
Calcium: 9.6 mg/dL (ref 8.7–10.3)
Chloride: 91 mmol/L — ABNORMAL LOW (ref 96–106)
Creatinine, Ser: 0.73 mg/dL (ref 0.57–1.00)
Glucose: 93 mg/dL (ref 65–99)
Potassium: 3.6 mmol/L (ref 3.5–5.2)
Sodium: 132 mmol/L — ABNORMAL LOW (ref 134–144)
eGFR: 85 mL/min/{1.73_m2} (ref >59)

## 2021-01-16 LAB — LIPID PANEL
Chol/HDL Ratio: 2.7 ratio (ref 0.0–4.4)
Cholesterol, Total: 202 mg/dL — ABNORMAL HIGH (ref 100–199)
HDL: 75 mg/dL (ref >39)
LDL Chol Calc (NIH): 116 mg/dL — ABNORMAL HIGH (ref 0–99)
Triglycerides: 60 mg/dL (ref 0–149)
VLDL Cholesterol Cal: 11 mg/dL (ref 5–40)

## 2021-01-16 NOTE — Patient Instructions (Signed)
Medication Instructions:  Your physician recommends that you continue on your current medications as directed. Please refer to the Current Medication list given to you today.  *If you need a refill on your cardiac medications before your next appointment, please call your pharmacy*   Lab Work: TODAY: FLP, ALT, BMET If you have labs (blood work) drawn today and your tests are completely normal, you will receive your results only by: [place] (if you have MyChart) OR A paper copy in the mail If you have any lab test that is abnormal or we need to change your treatment, we will call you to review the results.  Follow-Up: At [facility], you and your health needs are our priority.  As part of our continuing mission to provide you with exceptional heart care, we have created designated Provider Care Teams.  These Care Teams include your primary Cardiologist (physician) and Advanced Practice Providers (APPs -  Physician Assistants and Nurse Practitioners) who all work together to provide you with the care you need, when you need it.   Your next appointment:   1 year(s)  The format for your next appointment:   In Person  Provider:   You may see Mertie Moores, MD or one of the following Advanced Practice Providers on your designated Care Team:   Richardson Dopp, PA-C [place], [place]

## 2021-01-16 NOTE — Progress Notes (Signed)
Cardiology Office Note:    Date:  01/16/2021   ID:  Deborah Lowe, [place] August 03, 1940, MRN [id number]  PCP:  Sharion Balloon, FNP   [facility] Providers Cardiologist:  None     Referring MD: Sharion Balloon, FNP   Chief Complaint  Patient presents with   Hypertension   Palpitations     Aug. 24, 2022   Deborah Lowe is a 80 y.o. female with a hx of palpitations. Former patient of Dr. Doreatha Lew. I have seen her once in 2013.  She has been seen by Cecille Rubin for the past 9 years.  No issues with her palpitations Stays cold all the time  Walks regularly - 1 mile a day    Past Medical History:  Diagnosis Date   Breast cancer ([place]) 2012   s/p lumpectomy of left breast with XRT   Hypertension    Palpitations    on beta blocker therapy   Ringing in ears    Wears glasses     Past Surgical History:  Procedure Laterality Date   BREAST LUMPECTOMY Left    BREAST SURGERY     CARDIAC CATHETERIZATION  01/29/2007   CARDIOVASCULAR STRESS TEST  07/19/2002   EF 84%   CHOLECYSTECTOMY     TEAR DUCT PROBING      Current Medications: Current Meds  Medication Sig   calcium-vitamin D (OSCAL WITH D) 250-125 MG-UNIT tablet Take 1 tablet by mouth daily.   losartan-hydrochlorothiazide (HYZAAR) 100-12.5 MG tablet Take 1 tablet by mouth daily.   TOPROL XL 25 MG 24 hr tablet Take 1 tablet (25 mg total) by mouth daily.     Allergies:   Avelox [moxifloxacin hcl in nacl]   Social History   Socioeconomic History   Marital status: Married    Spouse name: terry   Number of children: 1   Years of education: 12   Highest education level: Not on file  Occupational History   Occupation: retired  Tobacco Use   Smoking status: Never   Smokeless tobacco: Never  Vaping Use   Vaping Use: Never used  Substance and Sexual Activity   Alcohol use: No   Drug use: No   Sexual activity: Yes    Birth control/protection: Post-menopausal  Other Topics Concern   Not on file  Social History  Narrative   Not on file   Social Determinants of Health   Financial Resource Strain: Not on file  Food Insecurity: Not on file  Transportation Needs: Not on file  Physical Activity: Not on file  Stress: Not on file  Social Connections: Not on file     Family History: The patient's family history includes Bone cancer in her brother; Colon cancer in her mother; Heart disease in her father.  ROS:   Please see the history of present illness.     All other systems reviewed and are negative.  EKGs/Labs/Other Studies Reviewed:    The following studies were reviewed today:   EKG:   January 16, 2021: Sinus bradycardia 56.  No ST or T wave changes.  Recent Labs: 01/17/2020: ALT 12; BUN 9; Creatinine, Ser 0.61; Hemoglobin 13.2; Platelets 301; Potassium 3.7; Sodium 133; TSH 2.940  Recent Lipid Panel    Component Value Date/Time   CHOL 206 (H) 01/17/2020 1123   TRIG 67 01/17/2020 1123   HDL 73 01/17/2020 1123   CHOLHDL 2.8 01/17/2020 1123   CHOLHDL 3.2 12/05/2015 0953   VLDL 16 12/05/2015 0953   LDLCALC 121 (  H) 01/17/2020 1123   LDLDIRECT 140.5 12/07/2012 1029     Risk Assessment/Calculations:           Physical Exam:    VS:  BP (!) 142/58   Pulse (!) 56   Ht 5' 1.5" (1.562 m)   Wt 113 lb 9.6 oz (51.5 kg)   SpO2 99%   BMI 21.12 kg/m     Wt Readings from Last 3 Encounters:  01/16/21 113 lb 9.6 oz (51.5 kg)  06/06/20 114 lb 11.2 oz (52 kg)  01/17/20 114 lb 6.4 oz (51.9 kg)     GEN:  Well nourished, well developed in no acute distress HEENT: Normal NECK: No JVD; No carotid bruits LYMPHATICS: No lymphadenopathy CARDIAC: RRR, no murmurs, rubs, gallops RESPIRATORY:  Clear to auscultation without rales, wheezing or rhonchi  ABDOMEN: Soft, non-tender, non-distended MUSCULOSKELETAL:  No edema; No deformity  SKIN: Warm and dry NEUROLOGIC:  Alert and oriented x 3 PSYCHIATRIC:  Normal affect   ASSESSMENT:    1. Heart palpitations   2. Essential hypertension   3.  Pure hypercholesterolemia    PLAN:    In order of problems listed above:  Palpitations: She is not having any new symptoms of palpitations.  She exercises on a regular basis.  We will continue her same medications.  2.  Hyperlipidemia: She has mild elevations of her LDL.  We will recheck her lipids today.  I will see her again in 1 year.        Medication Adjustments/Labs and Tests Ordered: Current medicines are reviewed at length with the patient today.  Concerns regarding medicines are outlined above.  Orders Placed This Encounter  Procedures   ALT   Lipid panel   Basic metabolic panel   EKG [id number]   No orders of the defined types were placed in this encounter.   Patient Instructions  Medication Instructions:  Your physician recommends that you continue on your current medications as directed. Please refer to the Current Medication list given to you today.  *If you need a refill on your cardiac medications before your next appointment, please call your pharmacy*   Lab Work: TODAY: FLP, ALT, BMET If you have labs (blood work) drawn today and your tests are completely normal, you will receive your results only by: [place] (if you have MyChart) OR A paper copy in the mail If you have any lab test that is abnormal or we need to change your treatment, we will call you to review the results.  Follow-Up: At [facility], you and your health needs are our priority.  As part of our continuing mission to provide you with exceptional heart care, we have created designated Provider Care Teams.  These Care Teams include your primary Cardiologist (physician) and Advanced Practice Providers (APPs -  Physician Assistants and Nurse Practitioners) who all work together to provide you with the care you need, when you need it.   Your next appointment:   1 year(s)  The format for your next appointment:   In Person  Provider:   You may see Mertie Moores, MD or one of the  following Advanced Practice Providers on your designated Care Team:   Richardson Dopp, PA-C [place], [place]    Signed, Mertie Moores, MD  01/16/2021 10:01 AM    [place]

## 2021-03-14 DIAGNOSIS — Z23 Encounter for immunization: Secondary | ICD-10-CM | POA: Diagnosis not present

## 2021-04-01 ENCOUNTER — Other Ambulatory Visit: Payer: Self-pay

## 2021-04-01 DIAGNOSIS — Z23 Encounter for immunization: Secondary | ICD-10-CM | POA: Diagnosis not present

## 2021-04-01 MED ORDER — LOSARTAN POTASSIUM-HCTZ 100-12.5 MG PO TABS: 1.0000 | ORAL_TABLET | Freq: Every day | ORAL | 2 refills | Status: DC

## 2021-04-04 ENCOUNTER — Telehealth: Payer: Self-pay | Admitting: Family

## 2021-04-04 NOTE — Telephone Encounter (Signed)
  Left message for patient to call back and schedule Medicare Annual Wellness Visit (AWV) to be completed by video or phone.  No hx of AWV eligible for AWVI as of 05/26/2009 per [employer]  Please schedule at anytime with [facility]-Nurse Health Advisor.      45 Minutes appointment   Any questions, please call me at [phone]   

## 2021-04-12 DIAGNOSIS — N760 Acute vaginitis: Secondary | ICD-10-CM | POA: Diagnosis not present

## 2021-04-12 DIAGNOSIS — R300 Dysuria: Secondary | ICD-10-CM | POA: Diagnosis not present

## 2021-05-14 ENCOUNTER — Telehealth: Payer: Self-pay | Admitting: Family

## 2021-05-24 ENCOUNTER — Ambulatory Visit: Payer: Medicare Other

## 2021-05-28 ENCOUNTER — Ambulatory Visit (INDEPENDENT_AMBULATORY_CARE_PROVIDER_SITE_OTHER): Payer: Medicare Other

## 2021-05-28 VITALS — Ht 62.0 in | Wt 113.0 lb

## 2021-05-28 DIAGNOSIS — M810 Age-related osteoporosis without current pathological fracture: Secondary | ICD-10-CM | POA: Insufficient documentation

## 2021-05-28 DIAGNOSIS — Z0000 Encounter for general adult medical examination without abnormal findings: Secondary | ICD-10-CM | POA: Diagnosis not present

## 2021-05-28 DIAGNOSIS — Z853 Personal history of malignant neoplasm of breast: Secondary | ICD-10-CM | POA: Insufficient documentation

## 2021-05-28 NOTE — Progress Notes (Signed)
Subjective:   Deborah Lowe is a 81 y.o. female who presents for an Initial Medicare Annual Wellness Visit.  Virtual Visit via Telephone Note  I connected with  Deborah Lowe on 05/28/21 at  9:00 AM EST by telephone and verified that I am speaking with the correct person using two identifiers.  Location: Patient: Home Provider: [facility] Persons participating in the virtual visit: patient/Nurse Health Advisor   I discussed the limitations, risks, security and privacy concerns of performing an evaluation and management service by telephone and the availability of in person appointments. The patient expressed understanding and agreed to proceed.  Interactive audio and video telecommunications were attempted between this nurse and patient, however failed, due to patient having technical difficulties OR patient did not have access to video capability.  We continued and completed visit with audio only.  Some vital signs may be absent or patient reported.   Makynna Manocchio E Lashaundra Lehrmann, LPN   Review of Systems     Cardiac Risk Factors include: advanced age (>40men, >67 women);sedentary lifestyle;hypertension     Objective:    Today's Vitals   05/28/21 0904  Weight: 113 lb (51.3 kg)  Height: 5\' 2"  (1.575 m)   Body mass index is 20.67 kg/m.  Advanced Directives 05/28/2021 05/15/2016 11/16/2014 08/04/2014  Does Patient Have a Medical Advance Directive? No No No No  Would patient like information on creating a medical advance directive? No - Patient declined - - No - patient declined information    Current Medications (verified) Outpatient Encounter Medications as of 05/28/2021  Medication Sig   calcium-vitamin D (OSCAL WITH D) 250-125 MG-UNIT tablet Take 1 tablet by mouth daily.   losartan-hydrochlorothiazide (HYZAAR) 100-12.5 MG tablet Take 1 tablet by mouth daily.   TOPROL XL 25 MG 24 hr tablet Take 1 tablet (25 mg total) by mouth daily.   No facility-administered encounter medications on file as  of 05/28/2021.    Allergies (verified) Avelox [moxifloxacin hcl in nacl]   History: Past Medical History:  Diagnosis Date   Breast cancer ([place]) 2012   s/p lumpectomy of left breast with XRT   Hypertension    Palpitations    on beta blocker therapy   Ringing in ears    Wears glasses    Past Surgical History:  Procedure Laterality Date   BREAST LUMPECTOMY Left    BREAST SURGERY     CARDIAC CATHETERIZATION  01/29/2007   CARDIOVASCULAR STRESS TEST  07/19/2002   EF 84%   CHOLECYSTECTOMY     TEAR DUCT PROBING     Family History  Problem Relation Age of Onset   Colon cancer Mother    Heart disease Father    Bone cancer Brother    Social History   Socioeconomic History   Marital status: Married    Spouse name: Coralyn Mark   Number of children: 1   Years of education: 12   Highest education level: Not on file  Occupational History   Occupation: retired  Tobacco Use   Smoking status: Never   Smokeless tobacco: Never  Vaping Use   Vaping Use: Never used  Substance and Sexual Activity   Alcohol use: No   Drug use: No   Sexual activity: Yes    Birth control/protection: Post-menopausal  Other Topics Concern   Not on file  Social History Narrative   Not on file   Social Determinants of Health   Financial Resource Strain: Low Risk    Difficulty of Paying Living Expenses:  Not hard at all  Food Insecurity: No Food Insecurity   Worried About Charity fundraiser in the Last Year: Never true   Ran Out of Food in the Last Year: Never true  Transportation Needs: No Transportation Needs   Lack of Transportation (Medical): No   Lack of Transportation (Non-Medical): No  Physical Activity: Insufficiently Active   Days of Exercise per Week: 3 days   Minutes of Exercise per Session: 30 min  Stress: No Stress Concern Present   Feeling of Stress : Not at all  Social Connections: Moderately Isolated   Frequency of Communication with Friends and Family: More than three times a week    Frequency of Social Gatherings with Friends and Family: More than three times a week   Attends Religious Services: Never   Marine scientist or Organizations: No   Attends Music therapist: Never   Marital Status: Married    Tobacco Counseling Counseling given: Not Answered   Clinical Intake:  Pre-visit preparation completed: Yes  Pain : No/denies pain     BMI - recorded: 20.67 Nutritional Status: BMI of 19-24  Normal Nutritional Risks: None Diabetes: No  How often do you need to have someone help you when you read instructions, pamphlets, or other written materials from your doctor or pharmacy?: 1 - Never  Diabetic? no  Interpreter Needed?: No  Information entered by :: Nela Bascom, LPN   Activities of Daily Living In your present state of health, do you have any difficulty performing the following activities: 05/28/2021  Hearing? N  Vision? N  Difficulty concentrating or making decisions? Y  Comment mild, intermittent, takes longer to think of names and dates at times  Walking or climbing stairs? N  Dressing or bathing? N  Doing errands, shopping? N  Preparing Food and eating ? N  Using the Toilet? N  In the past six months, have you accidently leaked urine? N  Do you have problems with loss of bowel control? N  Managing your Medications? N  Managing your Finances? N  Housekeeping or managing your Housekeeping? N  Some recent data might be hidden    Patient Care Team: Sharion Balloon, FNP as PCP - General (Family Medicine) Burtis Junes, NP (Inactive) as Nurse Practitioner (Nurse Practitioner) Molli Posey, MD as Consulting Physician (Obstetrics and Gynecology)  Indicate any recent Medical Services you may have received from other than [facility] providers in the past year (date may be approximate).     Assessment:   This is a routine wellness examination for Deborah Lowe.  Hearing/Vision screen Hearing Screening - Comments:: Denies  hearing difficulties  Vision Screening - Comments:: Wears rx glasses - up to date with annual eye exams with [facility]  Dietary issues and exercise activities discussed: Current Exercise Habits: Home exercise routine, Type of exercise: walking;stretching, Time (Minutes): 30, Frequency (Times/Week): 3, Weekly Exercise (Minutes/Week): 90, Intensity: Mild, Exercise limited by: None identified   Goals Addressed             This Visit's Progress    Exercise 150 min/wk Moderate Activity       Exercise daily Be consistent       Depression Screen PHQ 2/9 Scores 05/28/2021 10/13/2017 08/20/2017 06/16/2016 12/13/2015 09/21/2015 04/02/2015  PHQ - 2 Score 0 1 0 0 0 0 1    Fall Risk Fall Risk  05/28/2021 04/19/2019 10/13/2017 08/20/2017 06/16/2016  Falls in the past year? 0 0 No No No  Comment - Emmi  Telephone Survey: data to providers prior to load - - -  Number falls in past yr: 0 - - - -  Injury with Fall? 0 - - - -  Risk for fall due to : No Fall Risks - - - -  Follow up Falls prevention discussed - - - -    FALL RISK PREVENTION PERTAINING TO THE HOME:  Any stairs in or around the home? Yes  If so, are there any without handrails? No  Home free of loose throw rugs in walkways, pet beds, electrical cords, etc? Yes  Adequate lighting in your home to reduce risk of falls? Yes   ASSISTIVE DEVICES UTILIZED TO PREVENT FALLS:  Life alert? No  Use of a cane, walker or w/c? No  Grab bars in the bathroom? Yes  Shower chair or bench in shower? No  Elevated toilet seat or a handicapped toilet? Yes   TIMED UP AND GO:  Was the test performed? No . Telephonic visit  Cognitive Function:     6CIT Screen 05/28/2021  What Year? 0 points  What month? 0 points  What time? 0 points  Count back from 20 0 points  Months in reverse 0 points  Repeat phrase 6 points  Total Score 6    Immunizations Immunization History  Administered Date(s) Administered   Fluad Quad(high Dose 65+) 03/17/2019, 03/27/2020    Influenza, High Dose Seasonal PF 04/22/2017, 03/10/2018   Influenza-Unspecified 04/01/2021   Moderna Covid-19 Vaccine Bivalent Booster 74yrs & up 04/01/2021   Moderna SARS-COV2 Booster Vaccination 10/15/2020   Pneumococcal Conjugate-13 03/19/2015   Pneumococcal Polysaccharide-23 03/31/2016   Unspecified SARS-COV-2 Vaccination 06/16/2019, 07/07/2019, 02/18/2020    TDAP status: Due, Education has been provided regarding the importance of this vaccine. Advised may receive this vaccine at local pharmacy or Health Dept. Aware to provide a copy of the vaccination record if obtained from local pharmacy or Health Dept. Verbalized acceptance and understanding.  Flu Vaccine status: Up to date  Pneumococcal vaccine status: Up to date  Covid-19 vaccine status: Completed vaccines  Qualifies for Shingles Vaccine? Yes   Zostavax completed No   Shingrix Completed?: No.    Education has been provided regarding the importance of this vaccine. Patient has been advised to call insurance company to determine out of pocket expense if they have not yet received this vaccine. Advised may also receive vaccine at local pharmacy or Health Dept. Verbalized acceptance and understanding.  Screening Tests Health Maintenance  Topic Date Due   Hepatitis C Screening  Never done   TETANUS/TDAP  Never done   Zoster Vaccines- Shingrix (1 of 2) Never done   Pneumonia Vaccine 88+ Years old  Completed   INFLUENZA VACCINE  Completed   DEXA SCAN  Completed   COVID-19 Vaccine  Completed   HPV VACCINES  Aged Out   COLONOSCOPY (Pts 45-64yrs Insurance coverage will need to be confirmed)  Discontinued    Health Maintenance  Health Maintenance Due  Topic Date Due   Hepatitis C Screening  Never done   TETANUS/TDAP  Never done   Zoster Vaccines- Shingrix (1 of 2) Never done    Colorectal cancer screening: No longer required.   Mammogram status: Completed 05/29/2020. Repeat every year with GYN - has appt  06/19/2021  Bone Density status: Completed 06/22/2018. Results reflect: Bone density results: OSTEOPOROSIS. Repeat every 2 years. - does with GYN - has appt 06/19/2021  Lung Cancer Screening: (Low Dose CT Chest recommended if Age 38-80 years, 73  pack-year currently smoking OR have quit w/in 15years.) does not qualify  Additional Screening:  Hepatitis C Screening: does not qualify  Vision Screening: Recommended annual ophthalmology exams for early detection of glaucoma and other disorders of the eye. Is the patient up to date with their annual eye exam?  Yes  Who is the provider or what is the name of the office in which the patient attends annual eye exams? [facility] If pt is not established with a provider, would they like to be referred to a provider to establish care? No .   Dental Screening: Recommended annual dental exams for proper oral hygiene  Community Resource Referral / Chronic Care Management: CRR required this visit?  No   CCM required this visit?  No      Plan:     I have personally reviewed and noted the following in the patients chart:   Medical and social history Use of alcohol, tobacco or illicit drugs  Current medications and supplements including opioid prescriptions. Patient is not currently taking opioid prescriptions. Functional ability and status Nutritional status Physical activity Advanced directives List of other physicians Hospitalizations, surgeries, and ER visits in previous 12 months Vitals Screenings to include cognitive, depression, and falls Referrals and appointments  In addition, I have reviewed and discussed with patient certain preventive protocols, quality metrics, and best practice recommendations. A written personalized care plan for preventive services as well as general preventive health recommendations were provided to patient.     Sandrea Hammond, LPN   06/29/5359   Nurse Notes: None

## 2021-05-28 NOTE — Patient Instructions (Signed)
Deborah Lowe , Thank you for taking time to come for your Medicare Wellness Visit. I appreciate your ongoing commitment to your health goals. Please review the following plan we discussed and let me know if I can assist you in the future.   Screening recommendations/referrals: Colonoscopy: Done 12/06/2020 - no repeat Mammogram: Done 05/29/2020 - Repeat annually (appointment with gyn 06/19/21) Bone Density: Done 06/22/2018 - Repeat every 2 years (appointment with gyn 06/19/2021) Recommended yearly ophthalmology/optometry visit for glaucoma screening and checkup Recommended yearly dental visit for hygiene and checkup  Vaccinations: Influenza vaccine: Done 04/01/2021 - Repeat annually Pneumococcal vaccine: Done 03/19/2015 & 03/31/2016 Tdap vaccine: Due (every 10 years) Shingles vaccine: Due (2 doses 6 months apart, over 90% effective)   Covid-19:Done 06/16/2019, 07/07/2019, 02/18/2020, 10/15/2020, 04/01/2021  Advanced directives: Advance directive discussed with you today. Even though you declined this today, please call our office should you change your mind, and we can give you the proper paperwork for you to fill out.   Conditions/risks identified: Aim for 30 minutes of exercise or brisk walking each day, drink 6-8 glasses of water and eat lots of fruits and vegetables.   Next appointment: Follow up in one year for your annual wellness visit    Preventive Care 79 Years and Older, Female Preventive care refers to lifestyle choices and visits with your health care provider that can promote health and wellness. What does preventive care include? A yearly physical exam. This is also called an annual well check. Dental exams once or twice a year. Routine eye exams. Ask your health care provider how often you should have your eyes checked. Personal lifestyle choices, including: Daily care of your teeth and gums. Regular physical activity. Eating a healthy diet. Avoiding tobacco and drug use. Limiting  alcohol use. Practicing safe sex. Taking low-dose aspirin every day. Taking vitamin and mineral supplements as recommended by your health care provider. What happens during an annual well check? The services and screenings done by your health care provider during your annual well check will depend on your age, overall health, lifestyle risk factors, and family history of disease. Counseling  Your health care provider may ask you questions about your: Alcohol use. Tobacco use. Drug use. Emotional well-being. Home and relationship well-being. Sexual activity. Eating habits. History of falls. Memory and ability to understand (cognition). Work and work Statistician. Reproductive health. Screening  You may have the following tests or measurements: Height, weight, and BMI. Blood pressure. Lipid and cholesterol levels. These may be checked every 5 years, or more frequently if you are over 79 years old. Skin check. Lung cancer screening. You may have this screening every year starting at age 79 if you have a 30-pack-year history of smoking and currently smoke or have quit within the past 15 years. Fecal occult blood test (FOBT) of the stool. You may have this test every year starting at age 79. Flexible sigmoidoscopy or colonoscopy. You may have a sigmoidoscopy every 5 years or a colonoscopy every 10 years starting at age 29. Hepatitis C blood test. Hepatitis B blood test. Sexually transmitted disease (STD) testing. Diabetes screening. This is done by checking your blood sugar (glucose) after you have not eaten for a while (fasting). You may have this done every 1-3 years. Bone density scan. This is done to screen for osteoporosis. You may have this done starting at age 79. Mammogram. This may be done every 1-2 years. Talk to your health care provider about how often you should have  regular mammograms. Talk with your health care provider about your test results, treatment options, and if  necessary, the need for more tests. Vaccines  Your health care provider may recommend certain vaccines, such as: Influenza vaccine. This is recommended every year. Tetanus, diphtheria, and acellular pertussis (Tdap, Td) vaccine. You may need a Td booster every 10 years. Zoster vaccine. You may need this after age 79. Pneumococcal 13-valent conjugate (PCV13) vaccine. One dose is recommended after age 79. Pneumococcal polysaccharide (PPSV23) vaccine. One dose is recommended after age 79. Talk to your health care provider about which screenings and vaccines you need and how often you need them. This information is not intended to replace advice given to you by your health care provider. Make sure you discuss any questions you have with your health care provider. Document Released: 06/08/2015 Document Revised: 01/30/2016 Document Reviewed: 03/13/2015 Elsevier Interactive Patient Education  2017 [place] Prevention in the Home Falls can cause injuries. They can happen to people of all ages. There are many things you can do to make your home safe and to help prevent falls. What can I do on the outside of my home? Regularly fix the edges of walkways and driveways and fix any cracks. Remove anything that might make you trip as you walk through a door, such as a raised step or threshold. Trim any bushes or trees on the path to your home. Use bright outdoor lighting. Clear any walking paths of anything that might make someone trip, such as rocks or tools. Regularly check to see if handrails are loose or broken. Make sure that both sides of any steps have handrails. Any raised decks and porches should have guardrails on the edges. Have any leaves, snow, or ice cleared regularly. Use sand or salt on walking paths during winter. Clean up any spills in your garage right away. This includes oil or grease spills. What can I do in the bathroom? Use night lights. Install grab bars by the toilet  and in the tub and shower. Do not use towel bars as grab bars. Use non-skid mats or decals in the tub or shower. If you need to sit down in the shower, use a plastic, non-slip stool. Keep the floor dry. Clean up any water that spills on the floor as soon as it happens. Remove soap buildup in the tub or shower regularly. Attach bath mats securely with double-sided non-slip rug tape. Do not have throw rugs and other things on the floor that can make you trip. What can I do in the bedroom? Use night lights. Make sure that you have a light by your bed that is easy to reach. Do not use any sheets or blankets that are too big for your bed. They should not hang down onto the floor. Have a firm chair that has side arms. You can use this for support while you get dressed. Do not have throw rugs and other things on the floor that can make you trip. What can I do in the kitchen? Clean up any spills right away. Avoid walking on wet floors. Keep items that you use a lot in easy-to-reach places. If you need to reach something above you, use a strong step stool that has a grab bar. Keep electrical cords out of the way. Do not use floor polish or wax that makes floors slippery. If you must use wax, use non-skid floor wax. Do not have throw rugs and other things on the floor that  can make you trip. What can I do with my stairs? Do not leave any items on the stairs. Make sure that there are handrails on both sides of the stairs and use them. Fix handrails that are broken or loose. Make sure that handrails are as long as the stairways. Check any carpeting to make sure that it is firmly attached to the stairs. Fix any carpet that is loose or worn. Avoid having throw rugs at the top or bottom of the stairs. If you do have throw rugs, attach them to the floor with carpet tape. Make sure that you have a light switch at the top of the stairs and the bottom of the stairs. If you do not have them, ask someone to add  them for you. What else can I do to help prevent falls? Wear shoes that: Do not have high heels. Have rubber bottoms. Are comfortable and fit you well. Are closed at the toe. Do not wear sandals. If you use a stepladder: Make sure that it is fully opened. Do not climb a closed stepladder. Make sure that both sides of the stepladder are locked into place. Ask someone to hold it for you, if possible. Clearly mark and make sure that you can see: Any grab bars or handrails. First and last steps. Where the edge of each step is. Use tools that help you move around (mobility aids) if they are needed. These include: Canes. Walkers. Scooters. Crutches. Turn on the lights when you go into a dark area. Replace any light bulbs as soon as they burn out. Set up your furniture so you have a clear path. Avoid moving your furniture around. If any of your floors are uneven, fix them. If there are any pets around you, be aware of where they are. Review your medicines with your doctor. Some medicines can make you feel dizzy. This can increase your chance of falling. Ask your doctor what other things that you can do to help prevent falls. This information is not intended to replace advice given to you by your health care provider. Make sure you discuss any questions you have with your health care provider. Document Released: 03/08/2009 Document Revised: 10/18/2015 Document Reviewed: 06/16/2014 Elsevier Interactive Patient Education  2017 [employer].

## 2021-06-06 ENCOUNTER — Encounter: Payer: Self-pay | Admitting: Adult Health

## 2021-06-06 ENCOUNTER — Other Ambulatory Visit: Payer: Self-pay

## 2021-06-06 ENCOUNTER — Inpatient Hospital Stay: Payer: Medicare Other | Attending: Adult Health | Admitting: Adult Health

## 2021-06-06 VITALS — BP 176/58 | HR 59 | Temp 97.7°F | Resp 18 | Ht 62.0 in | Wt 114.2 lb

## 2021-06-06 DIAGNOSIS — Z170 Estrogen receptor positive status [ER+]: Secondary | ICD-10-CM | POA: Diagnosis not present

## 2021-06-06 DIAGNOSIS — M810 Age-related osteoporosis without current pathological fracture: Secondary | ICD-10-CM | POA: Diagnosis not present

## 2021-06-06 DIAGNOSIS — C50412 Malignant neoplasm of upper-outer quadrant of left female breast: Secondary | ICD-10-CM

## 2021-06-06 DIAGNOSIS — Z853 Personal history of malignant neoplasm of breast: Secondary | ICD-10-CM | POA: Insufficient documentation

## 2021-06-06 NOTE — Progress Notes (Signed)
CLINIC:  Survivorship   REASON FOR VISIT:  Routine follow-up for history of breast cancer.   BRIEF ONCOLOGIC HISTORY:  Oncology History  Carcinoma of upper-outer quadrant of left female breast ([place])  12/25/2010 Surgery   Left breast lumpectomy invasive ductal carcinoma with high-grade DCIS ER 80% PR 10% Ki-67 60% T1 a. N0 M0 stage IA (IDC measured 0.1 cm)   02/04/2011 - 03/06/2011 Radiation Therapy   Radiation therapy to lumpectomy site by Dr. Valere Dross   04/26/2011 - 05/11/2017 Anti-estrogen oral therapy   Arimidex 1 mg daily. Plan is to treat her for 5 years      INTERVAL HISTORY:  Ms. Waln presents to the [facility] today for routine follow-up for her history of breast cancer.  Overall, she reports feeling quite well. She has had no health changes since we saw her last.  Her most recent mammogram was completed on May 29, 2020.  This showed no mammographic evidence of malignancy in breast density category B.  Her most recent bone density was completed in 05/2018 and she had osteoporosis.  Her bone density improved from prior.  She is taking calcium, vitamin d, and performs exercise.  She is due for repeat bone density this year.  She is planning on calling [facility] and getting it scheduled now that she is in 2023 and her insurance will cover it.  Overall she is feeling quite well.  She saw Dr. Paulita Fujita last year and had her final colonoscopy.  She says that she is up-to-date on her vaccinations.  She remains active.  REVIEW OF SYSTEMS:  Review of Systems  Constitutional:  Negative for appetite change, chills, fatigue, fever and unexpected weight change.  HENT:   Negative for hearing loss, lump/mass and trouble swallowing.   Eyes:  Negative for eye problems and icterus.  Respiratory:  Negative for chest tightness, cough and shortness of breath.   Cardiovascular:  Negative for chest pain, leg swelling and palpitations.  Gastrointestinal:  Negative for abdominal distention,  abdominal pain, constipation, diarrhea, nausea and vomiting.  Endocrine: Negative for hot flashes.  Genitourinary:  Negative for difficulty urinating.   Musculoskeletal:  Negative for arthralgias.  Skin:  Negative for itching and rash.  Neurological:  Negative for dizziness, extremity weakness, headaches and numbness.  Hematological:  Negative for adenopathy. Does not bruise/bleed easily.  Psychiatric/Behavioral:  Negative for depression. The patient is not nervous/anxious.  Breast: Denies any new nodularity, masses, tenderness, nipple changes, or nipple discharge.       PAST MEDICAL/SURGICAL HISTORY:  Past Medical History:  Diagnosis Date   Breast cancer ([place]) 2012   s/p lumpectomy of left breast with XRT   Hypertension    Palpitations    on beta blocker therapy   Ringing in ears    Wears glasses    Past Surgical History:  Procedure Laterality Date   BREAST LUMPECTOMY Left    BREAST SURGERY     CARDIAC CATHETERIZATION  01/29/2007   CARDIOVASCULAR STRESS TEST  07/19/2002   EF 84%   CHOLECYSTECTOMY     TEAR DUCT PROBING       ALLERGIES:  Allergies  Allergen Reactions   Avelox [Moxifloxacin Hcl In Nacl] Anaphylaxis     CURRENT MEDICATIONS:  Outpatient Encounter Medications as of 06/06/2021  Medication Sig   calcium-vitamin D (OSCAL WITH D) 250-125 MG-UNIT tablet Take 1 tablet by mouth daily.   losartan-hydrochlorothiazide (HYZAAR) 100-12.5 MG tablet Take 1 tablet by mouth daily.   TOPROL XL 25 MG 24  hr tablet Take 1 tablet (25 mg total) by mouth daily.   No facility-administered encounter medications on file as of 06/06/2021.     ONCOLOGIC FAMILY HISTORY:  Family History  Problem Relation Age of Onset   Colon cancer Mother    Heart disease Father    Bone cancer Brother     GENETIC COUNSELING/TESTING: Not at this time  SOCIAL HISTORY:  Social History   Socioeconomic History   Marital status: Married    Spouse name: Coralyn Mark   Number of children: 1   Years  of education: 12   Highest education level: Not on file  Occupational History   Occupation: retired  Tobacco Use   Smoking status: Never   Smokeless tobacco: Never  Vaping Use   Vaping Use: Never used  Substance and Sexual Activity   Alcohol use: No   Drug use: No   Sexual activity: Yes    Birth control/protection: Post-menopausal  Other Topics Concern   Not on file  Social History Narrative   Not on file   Social Determinants of Health   Financial Resource Strain: Low Risk    Difficulty of Paying Living Expenses: Not hard at all  Food Insecurity: No Food Insecurity   Worried About Charity fundraiser in the Last Year: Never true   [place] in the Last Year: Never true  Transportation Needs: No Transportation Needs   Lack of Transportation (Medical): No   Lack of Transportation (Non-Medical): No  Physical Activity: Insufficiently Active   Days of Exercise per Week: 3 days   Minutes of Exercise per Session: 30 min  Stress: No Stress Concern Present   Feeling of Stress : Not at all  Social Connections: Moderately Isolated   Frequency of Communication with Friends and Family: More than three times a week   Frequency of Social Gatherings with Friends and Family: More than three times a week   Attends Religious Services: Never   Marine scientist or Organizations: No   Attends Archivist Meetings: Never   Marital Status: Married  Human resources officer Violence: Not At Risk   Fear of Current or Ex-Partner: No   Emotionally Abused: No   Physically Abused: No   Sexually Abused: No      PHYSICAL EXAMINATION:  Vital Signs: Vitals:   06/06/21 1114  BP: (!) 176/58  Pulse: (!) 59  Resp: 18  Temp: 97.7 F (36.5 C)  SpO2: 100%   Filed Weights   06/06/21 1114  Weight: 114 lb 3.2 oz (51.8 kg)   General: Well-nourished, well-appearing female in no acute distress.  Unaccompanied today.   HEENT: Head is normocephalic.  Pupils equal and reactive to light.  Conjunctivae clear without exudate.  Sclerae anicteric. Oral mucosa is pink, moist.  Oropharynx is pink without lesions or erythema.  Lymph: No cervical, supraclavicular, or infraclavicular lymphadenopathy noted on palpation.  Cardiovascular: Regular rate and rhythm.Marland Kitchen Respiratory: Clear to auscultation bilaterally. Chest expansion symmetric; breathing non-labored.  Breast Exam:  -Left breast: No appreciable masses on palpation. No skin redness, thickening, or peau d'orange appearance; no nipple retraction or nipple discharge; mild distortion in symmetry at previous lumpectomy site well healed scar without erythema or nodularity.  -Right breast: No appreciable masses on palpation. No skin redness, thickening, or peau d'orange appearance; no nipple retraction or nipple discharge; mild distortion in symmetry at previous lumpectomy siterity. -Axilla: No axillary adenopathy bilaterally.  GI: Abdomen soft and round; non-tender, non-distended. Bowel sounds normoactive.  No hepatosplenomegaly.   GU: Deferred.  Neuro: No focal deficits. Steady gait.  Psych: Mood and affect normal and appropriate for situation.  MSK: No focal spinal tenderness to palpation, full range of motion in bilateral upper extremities Extremities: No edema. Skin: Warm and dry.  LABORATORY DATA:  None for this visit   DIAGNOSTIC IMAGING:  Most recent mammogram:      ASSESSMENT AND PLAN:  Ms.. Nguyen is a pleasant 81 y.o. female with history of Stage IA left breast invasive ductal carcinoma, ER+/PR+/HER2-, diagnosed in 2012, treated with lumpectomy, adjuvant radiation therapy, and anti-estrogen therapy with Anastrozole daily x 5 years completed in 2018.  [facility] for surveillance and routine follow-up.   1. History of breast cancer:  Ms. Londo is currently clinically and radiographically without evidence of disease or recurrence of breast cancer.  She is due for repeat mammogram this month.  She is  planning on calling [facility] and getting this scheduled.  We will continue to see her once a year at her request, which we are happy to accommodate.  I encouraged her to call me with any questions or concerns before her next visit at the cancer center, and I would be happy to see her sooner, if needed.    2. Bone health:  Given Ms. Rada age, history of breast cancer, and her previous anti-estrogen therapy with Anastrozole, she is at risk for bone demineralization. Her last DEXA scan was in 04/2019 and showed osteoporosis, this was improved from prior.  She is going to have this bone density testing repeated this year.  She was given education on specific food and activities to promote bone health.  3. Cancer screening:  Due to Ms. Donlan history and her age, she should receive screening for skin cancers, colon cancer. She was encouraged to follow-up with her PCP for appropriate cancer screenings.   4. Health maintenance and wellness promotion: Ms. Polendo was encouraged to consume 5-7 servings of fruits and vegetables per day. She was also encouraged to engage in moderate to vigorous exercise for 30 minutes per day most days of the week. She was instructed to limit her alcohol consumption and continue to abstain from tobacco use.     Dispo:  -Return to cancer center in one year for LTS follow-up -Bone density testing due -Mammogram due   Total encounter time: 20 minutes*in face-to-face visit time, chart review, lab review, care coordination, and documentation of the encounter.  Wilber Bihari, NP 06/06/21 11:56 AM Medical Oncology and Hematology [facility] [place], [place] [address] Tel. [phone]    Fax. [phone]  *Total Encounter Time as defined by the Centers for Medicare and Medicaid Services includes, in addition to the face-to-face time of a patient visit (documented in the note above) non-face-to-face time: obtaining and reviewing outside history,  ordering and reviewing medications, tests or procedures, care coordination (communications with other health care professionals or caregivers) and documentation in the medical record.    Note: PRIMARY CARE PROVIDER Sharion Balloon, [place] [phone]

## 2021-06-19 DIAGNOSIS — Z6821 Body mass index (BMI) 21.0-21.9, adult: Secondary | ICD-10-CM | POA: Diagnosis not present

## 2021-06-19 DIAGNOSIS — Z124 Encounter for screening for malignant neoplasm of cervix: Secondary | ICD-10-CM | POA: Diagnosis not present

## 2021-06-26 ENCOUNTER — Encounter: Payer: Self-pay | Admitting: Adult Health

## 2021-06-26 DIAGNOSIS — Z780 Asymptomatic menopausal state: Secondary | ICD-10-CM | POA: Diagnosis not present

## 2021-06-26 DIAGNOSIS — M810 Age-related osteoporosis without current pathological fracture: Secondary | ICD-10-CM | POA: Diagnosis not present

## 2021-06-26 DIAGNOSIS — Z1231 Encounter for screening mammogram for malignant neoplasm of breast: Secondary | ICD-10-CM | POA: Diagnosis not present

## 2021-07-09 DIAGNOSIS — Z20822 Contact with and (suspected) exposure to covid-19: Secondary | ICD-10-CM | POA: Diagnosis not present

## 2021-07-09 DIAGNOSIS — K529 Noninfective gastroenteritis and colitis, unspecified: Secondary | ICD-10-CM | POA: Diagnosis not present

## 2021-07-09 DIAGNOSIS — R110 Nausea: Secondary | ICD-10-CM | POA: Diagnosis not present

## 2021-08-01 DIAGNOSIS — D2372 Other benign neoplasm of skin of left lower limb, including hip: Secondary | ICD-10-CM | POA: Diagnosis not present

## 2021-08-01 DIAGNOSIS — M79672 Pain in left foot: Secondary | ICD-10-CM | POA: Diagnosis not present

## 2021-09-13 ENCOUNTER — Other Ambulatory Visit: Payer: Self-pay | Admitting: Nurse Practitioner

## 2021-09-23 DIAGNOSIS — D225 Melanocytic nevi of trunk: Secondary | ICD-10-CM | POA: Diagnosis not present

## 2021-09-23 DIAGNOSIS — L219 Seborrheic dermatitis, unspecified: Secondary | ICD-10-CM | POA: Diagnosis not present

## 2021-09-23 DIAGNOSIS — D485 Neoplasm of uncertain behavior of skin: Secondary | ICD-10-CM | POA: Diagnosis not present

## 2021-09-23 DIAGNOSIS — L821 Other seborrheic keratosis: Secondary | ICD-10-CM | POA: Diagnosis not present

## 2021-12-04 DIAGNOSIS — Z961 Presence of intraocular lens: Secondary | ICD-10-CM | POA: Diagnosis not present

## 2021-12-04 DIAGNOSIS — H26491 Other secondary cataract, right eye: Secondary | ICD-10-CM | POA: Diagnosis not present

## 2021-12-11 DIAGNOSIS — H26492 Other secondary cataract, left eye: Secondary | ICD-10-CM | POA: Diagnosis not present

## 2021-12-26 DIAGNOSIS — N952 Postmenopausal atrophic vaginitis: Secondary | ICD-10-CM | POA: Diagnosis not present

## 2021-12-30 ENCOUNTER — Other Ambulatory Visit: Payer: Self-pay | Admitting: *Deleted

## 2021-12-30 MED ORDER — TOPROL XL 25 MG PO TB24: 25.0000 mg | ORAL_TABLET | Freq: Every day | ORAL | 3 refills | Status: DC

## 2022-01-01 ENCOUNTER — Other Ambulatory Visit: Payer: Self-pay | Admitting: Cardiovascular Disease

## 2022-01-16 NOTE — Progress Notes (Signed)
Cardiology Office Note:    Date:  01/17/2022   ID:  Elasha, Tess [birth year], MRN [id number]  PCP:  Sharion Balloon, [place] Providers Cardiologist:  Mertie Moores, MD Cardiology APP:  [facility]     Referring MD: Sharion Balloon, FNP   Chief Complaint:  Follow-up for hypertension, palpitations    Patient Profile: Palpitations  Hypertension  Hyperlipidemia  Breast CA s/p lumpectomy (L breast)  Normal coronary arteries (cath 01/2007)  Prior CV Studies: Myoview 07/19/02 Low risk   History of Present Illness:   Deborah Lowe is a 81 y.o. female with the above problem list.  She was last seen by Dr. Acie Fredrickson in Aug 22. She returns for f/u. She is here alone. She is doing well w/o chest pain, shortness of breath, syncope, orthopnea, leg edema. She is fasting for labs today. She would like to have her "iron" checked. She notes fatigue.     Past Medical History:  Diagnosis Date   Breast cancer ([place]) 2012   s/p lumpectomy of left breast with XRT   Hypertension    Palpitations    on beta blocker therapy   Ringing in ears    Wears glasses    Current Medications: Current Meds  Medication Sig   calcium-vitamin D (OSCAL WITH D) 250-125 MG-UNIT tablet Take 1 tablet by mouth daily.   [DISCONTINUED] losartan-hydrochlorothiazide (HYZAAR) 100-12.5 MG tablet Take 1 tablet by mouth daily. Please keep upcoming appt. with Cardiologist in order to receive future refills. Thank You.   [DISCONTINUED] TOPROL XL 25 MG 24 hr tablet Take 1 tablet (25 mg total) by mouth daily.    Allergies:   Avelox [moxifloxacin hcl in nacl]   Social History   Tobacco Use   Smoking status: Never   Smokeless tobacco: Never  Vaping Use   Vaping Use: Never used  Substance Use Topics   Alcohol use: No   Drug use: No    Family Hx: The patient's family history includes Bone cancer in her brother; Colon cancer in her mother; Heart disease in her father.  Review of Systems   Constitutional: Positive for malaise/fatigue.  Endocrine: Positive for cold intolerance.     EKGs/Labs/Other Test Reviewed:    EKG:  EKG is  ordered today.  The ekg ordered today demonstrates sinus bradycardia, HR 51, leftward axis, low voltage, no ST-T wave changes, QTc 412  Recent Labs: No results found for requested labs within last 365 days.   Recent Lipid Panel No results for input(s): "CHOL", "TRIG", "HDL", "VLDL", "LDLCALC", "LDLDIRECT" in the last 8760 hours.   Risk Assessment/Calculations/Metrics:         HYPERTENSION CONTROL Vitals:   01/17/22 1034 01/17/22 1111  BP: (!) 180/60 (!) 184/80    The patient's blood pressure is elevated above target today.  In order to address the patient's elevated BP: Blood pressure will be monitored at home to determine if medication changes need to be made.       Physical Exam:    VS:  BP (!) 184/80   Pulse (!) 51   Ht '5\' 1"'$  (1.549 m)   Wt 114 lb (51.7 kg)   SpO2 95%   BMI 21.54 kg/m     Wt Readings from Last 3 Encounters:  01/17/22 114 lb (51.7 kg)  06/06/21 114 lb 3.2 oz (51.8 kg)  05/28/21 113 lb (51.3 kg)    Constitutional:      Appearance: Healthy appearance.  Not in distress.  Neck:     Vascular: No carotid bruit. JVD normal.  Pulmonary:     Effort: Pulmonary effort is normal.     Breath sounds: No wheezing. No rales.  Cardiovascular:     Normal rate. Regular rhythm. Normal S1. Normal S2.      Murmurs: There is no murmur.  Edema:    Peripheral edema absent.  Abdominal:     Palpations: Abdomen is soft.  Skin:    General: Skin is warm and dry.  Neurological:     Mental Status: Alert and oriented to person, place and time.          ASSESSMENT & PLAN:   Essential hypertension Blood pressure is elevated today.  She notes optimal blood pressures at home.  She typically has elevated blood pressures in clinic.  Prior to adjusting her medications, I would like her to monitor her blood pressure for 2 weeks at  home.  She will send those readings via MyChart.  Continue losartan/HCTZ 100/12.5 mg daily, Toprol-XL 25 mg daily.  If blood pressure above target, consider adding amlodipine.  Follow-up in 1 year or sooner if blood pressure medications need to be adjusted.  Pure hypercholesterolemia Managed by diet.  She is fasting today.  Obtain CMET, lipids.  Other fatigue She notes fatigue, especially in the evenings.  She also notes cold intolerance.  Obtain CBC, TSH.  Palpitations Overall well controlled on beta-blocker therapy.  Continue Toprol-XL 25 mg daily.           Dispo:  Return in about 1 year (around 01/18/2023) for Routine Follow Up, w/ Dr. Acie Fredrickson, or Richardson Dopp, PA-C.   Medication Adjustments/Labs and Tests Ordered: Current medicines are reviewed at length with the patient today.  Concerns regarding medicines are outlined above.  Tests Ordered: Orders Placed This Encounter  Procedures   Comprehensive metabolic panel   Lipid panel   CBC   TSH   Medication Changes: Meds ordered this encounter  Medications   losartan-hydrochlorothiazide (HYZAAR) 100-12.5 MG tablet    Sig: Take 1 tablet by mouth daily.    Dispense:  90 tablet    Refill:  3   TOPROL XL 25 MG 24 hr tablet    Sig: Take 1 tablet (25 mg total) by mouth daily.    Dispense:  90 tablet    Refill:  3   Signed, Richardson Dopp, PA-C  01/17/2022 11:15 AM    [facility] [address], [place], [place]  [address] Phone: [phone]; Fax: [phone]

## 2022-01-17 ENCOUNTER — Encounter: Payer: Self-pay | Admitting: Physician Assistant

## 2022-01-17 ENCOUNTER — Ambulatory Visit (INDEPENDENT_AMBULATORY_CARE_PROVIDER_SITE_OTHER): Payer: Medicare Other | Admitting: Physician Assistant

## 2022-01-17 VITALS — BP 184/80 | HR 51 | Ht 61.0 in | Wt 114.0 lb

## 2022-01-17 DIAGNOSIS — E7800 Pure hypercholesterolemia, unspecified: Secondary | ICD-10-CM | POA: Diagnosis not present

## 2022-01-17 DIAGNOSIS — I10 Essential (primary) hypertension: Secondary | ICD-10-CM | POA: Diagnosis not present

## 2022-01-17 DIAGNOSIS — R002 Palpitations: Secondary | ICD-10-CM | POA: Diagnosis not present

## 2022-01-17 DIAGNOSIS — R5383 Other fatigue: Secondary | ICD-10-CM | POA: Insufficient documentation

## 2022-01-17 LAB — CBC

## 2022-01-17 MED ORDER — TOPROL XL 25 MG PO TB24: 25.0000 mg | ORAL_TABLET | Freq: Every day | ORAL | 3 refills | Status: DC

## 2022-01-17 MED ORDER — LOSARTAN POTASSIUM-HCTZ 100-12.5 MG PO TABS: 1.0000 | ORAL_TABLET | Freq: Every day | ORAL | 3 refills | Status: DC

## 2022-01-17 NOTE — Assessment & Plan Note (Signed)
Managed by diet.  She is fasting today.  Obtain CMET, lipids.

## 2022-01-17 NOTE — Assessment & Plan Note (Signed)
She notes fatigue, especially in the evenings.  She also notes cold intolerance.  Obtain CBC, TSH.

## 2022-01-17 NOTE — Patient Instructions (Signed)
Medication Instructions:  Your physician recommends that you continue on your current medications as directed. Please refer to the Current Medication list given to you today.  *If you need a refill on your cardiac medications before your next appointment, please call your pharmacy*   Lab Work: TODAY:  CMET, LIPID, CBC, & TSH  If you have labs (blood work) drawn today and your tests are completely normal, you will receive your results only by: [place] (if you have MyChart) OR A paper copy in the mail If you have any lab test that is abnormal or we need to change your treatment, we will call you to review the results.   Testing/Procedures: None ordered   Follow-Up: At [facility], you and your health needs are our priority.  As part of our continuing mission to provide you with exceptional heart care, we have created designated Provider Care Teams.  These Care Teams include your primary Cardiologist (physician) and Advanced Practice Providers (APPs -  Physician Assistants and Nurse Practitioners) who all work together to provide you with the care you need, when you need it.  We recommend signing up for the patient portal called "MyChart".  Sign up information is provided on this After Visit Summary.  MyChart is used to connect with patients for Virtual Visits (Telemedicine).  Patients are able to view lab/test results, encounter notes, upcoming appointments, etc.  Non-urgent messages can be sent to your provider as well.   To learn more about what you can do with MyChart, go to [email or web address].    Your next appointment:   1 year(s)  The format for your next appointment:   In Person  Provider:   None  or Richardson Dopp, PA-C         Other Instructions Your physician has requested that you regularly monitor and record your blood pressure readings at home. Please use the same machine at the same time of day to check your readings and record them to bring to your  follow-up visit.   Please monitor blood pressures and keep a log of your readings and send them via mychart in 2 weeks.   Make sure to check 2 hours after your medications.    AVOID these things for 30 minutes before checking your blood pressure: No Drinking caffeine. No Drinking alcohol. No Eating. No Smoking. No Exercising.   Five minutes before checking your blood pressure: Pee. Sit in a dining chair. Avoid sitting in a soft couch or armchair. Be quiet. Do not talk   Important Information About Sugar

## 2022-01-17 NOTE — Assessment & Plan Note (Signed)
Blood pressure is elevated today.  She notes optimal blood pressures at home.  She typically has elevated blood pressures in clinic.  Prior to adjusting her medications, I would like her to monitor her blood pressure for 2 weeks at home.  She will send those readings via MyChart.  Continue losartan/HCTZ 100/12.5 mg daily, Toprol-XL 25 mg daily.  If blood pressure above target, consider adding amlodipine.  Follow-up in 1 year or sooner if blood pressure medications need to be adjusted.

## 2022-01-17 NOTE — Assessment & Plan Note (Signed)
Overall well controlled on beta-blocker therapy.  Continue Toprol-XL 25 mg daily.

## 2022-01-18 LAB — CBC
Hematocrit: 41.9 % (ref 34.0–46.6)
Hemoglobin: 13.5 g/dL (ref 11.1–15.9)
MCH: 29.3 pg (ref 26.6–33.0)
MCHC: 32.2 g/dL (ref 31.5–35.7)
MCV: 91 fL (ref 79–97)
Platelets: 312 10*3/uL (ref 150–450)
RBC: 4.61 x10E6/uL (ref 3.77–5.28)
RDW: 12.6 % (ref 11.7–15.4)
WBC: 5.9 10*3/uL (ref 3.4–10.8)

## 2022-01-18 LAB — LIPID PANEL
Chol/HDL Ratio: 2.4 ratio (ref 0.0–4.4)
Cholesterol, Total: 176 mg/dL (ref 100–199)
HDL: 73 mg/dL (ref >39)
LDL Chol Calc (NIH): 92 mg/dL (ref 0–99)
Triglycerides: 55 mg/dL (ref 0–149)
VLDL Cholesterol Cal: 11 mg/dL (ref 5–40)

## 2022-01-18 LAB — COMPREHENSIVE METABOLIC PANEL
ALT: 12 IU/L (ref 0–32)
AST: 10 IU/L (ref 0–40)
Albumin/Globulin Ratio: 2.5 — ABNORMAL HIGH (ref 1.2–2.2)
Albumin: 4.7 g/dL (ref 3.8–4.8)
Alkaline Phosphatase: 74 IU/L (ref 44–121)
BUN/Creatinine Ratio: 14 (ref 12–28)
BUN: 10 mg/dL (ref 8–27)
Bilirubin Total: 1.3 mg/dL — ABNORMAL HIGH (ref 0.0–1.2)
CO2: 27 mmol/L (ref 20–29)
Calcium: 9.8 mg/dL (ref 8.7–10.3)
Chloride: 94 mmol/L — ABNORMAL LOW (ref 96–106)
Creatinine, Ser: 0.69 mg/dL (ref 0.57–1.00)
Globulin, Total: 1.9 g/dL (ref 1.5–4.5)
Glucose: 92 mg/dL (ref 70–99)
Potassium: 3.9 mmol/L (ref 3.5–5.2)
Sodium: 134 mmol/L (ref 134–144)
Total Protein: 6.6 g/dL (ref 6.0–8.5)
eGFR: 89 mL/min/{1.73_m2} (ref >59)

## 2022-01-18 LAB — TSH: TSH: 2.58 u[IU]/mL (ref 0.450–4.500)

## 2022-01-20 NOTE — Addendum Note (Signed)
Addended by: Briant Cedar on: 01/20/2022 03:40 PM   Modules accepted: Orders

## 2022-01-21 NOTE — Addendum Note (Signed)
Addended by: Briant Cedar on: 01/21/2022 08:44 AM   Modules accepted: Orders

## 2022-01-23 NOTE — Progress Notes (Signed)
Pt has been made aware of normal result and verbalized understanding.  jw

## 2022-02-25 DIAGNOSIS — H93293 Other abnormal auditory perceptions, bilateral: Secondary | ICD-10-CM | POA: Diagnosis not present

## 2022-02-25 DIAGNOSIS — E0789 Other specified disorders of thyroid: Secondary | ICD-10-CM | POA: Diagnosis not present

## 2022-02-26 ENCOUNTER — Other Ambulatory Visit: Payer: Self-pay | Admitting: Otolaryngology

## 2022-02-26 DIAGNOSIS — E079 Disorder of thyroid, unspecified: Secondary | ICD-10-CM

## 2022-03-06 DIAGNOSIS — H903 Sensorineural hearing loss, bilateral: Secondary | ICD-10-CM | POA: Diagnosis not present

## 2022-03-10 ENCOUNTER — Ambulatory Visit
Admission: RE | Admit: 2022-03-10 | Discharge: 2022-03-10 | Disposition: A | Discharge status: Home / Self Care | Payer: Medicare Other | Source: Ambulatory Visit | Attending: Otolaryngology | Admitting: Otolaryngology

## 2022-03-10 DIAGNOSIS — E079 Disorder of thyroid, unspecified: Secondary | ICD-10-CM

## 2022-03-10 DIAGNOSIS — E041 Nontoxic single thyroid nodule: Secondary | ICD-10-CM | POA: Diagnosis not present

## 2022-03-17 DIAGNOSIS — Z23 Encounter for immunization: Secondary | ICD-10-CM | POA: Diagnosis not present

## 2022-04-02 DIAGNOSIS — N762 Acute vulvitis: Secondary | ICD-10-CM | POA: Diagnosis not present

## 2022-05-13 ENCOUNTER — Encounter: Payer: Self-pay | Admitting: Physician Assistant

## 2022-05-13 ENCOUNTER — Telehealth: Payer: Self-pay | Admitting: Cardiovascular Disease

## 2022-05-13 ENCOUNTER — Ambulatory Visit: Payer: Medicare Other | Attending: Physician Assistant | Admitting: Physician Assistant

## 2022-05-13 VITALS — BP 170/80 | HR 67 | Ht 61.0 in | Wt 114.8 lb

## 2022-05-13 DIAGNOSIS — I10 Essential (primary) hypertension: Secondary | ICD-10-CM

## 2022-05-13 DIAGNOSIS — R002 Palpitations: Secondary | ICD-10-CM

## 2022-05-13 DIAGNOSIS — F419 Anxiety disorder, unspecified: Secondary | ICD-10-CM

## 2022-05-13 MED ORDER — LOSARTAN POTASSIUM-HCTZ 100-25 MG PO TABS: 1.0000 | ORAL_TABLET | Freq: Every day | ORAL | 3 refills | Status: DC

## 2022-05-13 MED ORDER — METOPROLOL SUCCINATE ER 25 MG PO TB24: ORAL_TABLET | ORAL | 3 refills | Status: DC

## 2022-05-13 NOTE — Assessment & Plan Note (Signed)
She has a long history of palpitations which have been managed with beta-blocker therapy.  She describes her palpitations as a rapid or pounding sensation.  She has not noticed any significant change over the years.  I have asked her to contact me if she continues to have rapid palpitations.  At that point, I would recommend a 14-day ZIO monitor to rule out significant arrhythmias.  Her heart rate is 60.  I do not think she would tolerate increasing her dose of beta-blocker.  I did advise her that she can take an extra dose of metoprolol succinate 25 mg as needed for significantly elevated blood pressures or prolonged palpitations.  Continue metoprolol succinate 25 mg daily.

## 2022-05-13 NOTE — Telephone Encounter (Signed)
Called patient about her message. Patient stated her SBP has been in the 170's for the last couple of days. At patient's last visit with Richardson Dopp PA before they make changes he wants her to monitor her BP at home, and he advised if her BP was elevated again, patient could follow-up sooner than later. Patient stated she was doing fine until now. Made patient an appointment to see Richardson Dopp PA for evaluation.

## 2022-05-13 NOTE — Assessment & Plan Note (Signed)
I have asked her to follow-up with her PCP for further management.

## 2022-05-13 NOTE — Assessment & Plan Note (Signed)
She brings in a list of her blood pressures from home.  Most of her readings are above target.  Her blood pressure is significantly elevated today.  It is usually higher in the clinic.  At last visit, consider using amlodipine.  I will try to keep her medications simple by increasing her losartan/HCTZ to 100/25 mg daily.  Obtain follow-up BMET, magnesium in 1 week.  She will monitor her blood pressures and provide those to me for review in 1 to 2 weeks.  Follow-up in 3 months.

## 2022-05-13 NOTE — Telephone Encounter (Signed)
Patient c/o Palpitations:  High priority if patient c/o lightheadedness, shortness of breath, or chest pain  How long have you had palpitations/irregular HR/ Afib? Are you having the symptoms now? Yes  Are you currently experiencing lightheadedness, SOB or CP? no  Do you have a history of afib (atrial fibrillation) or irregular heart rhythm? no  Have you checked your BP or HR? (document readings if available): 170/72; 161/77  Are you experiencing any other symptoms? no

## 2022-05-13 NOTE — Patient Instructions (Signed)
Medication Instructions:  Your physician has recommended you make the following change in your medication:   STOP Losartan-HCTZ 100-12.5  START Losartan-HCTZ 100-25 mg taking 1 a day  OK to take an extra Toprol Xl 25 mg daily as needed for palpitations or for blood pressure over 140/90  *If you need a refill on your cardiac medications before your next appointment, please call your pharmacy*   Lab Work: 1 WEEK:  BMET & MAG  If you have labs (blood work) drawn today and your tests are completely normal, you will receive your results only by: [place] (if you have MyChart) OR A paper copy in the mail If you have any lab test that is abnormal or we need to change your treatment, we will call you to review the results.   Testing/Procedures: None ordered   Follow-Up: At [facility], you and your health needs are our priority.  As part of our continuing mission to provide you with exceptional heart care, we have created designated Provider Care Teams.  These Care Teams include your primary Cardiologist (physician) and Advanced Practice Providers (APPs -  Physician Assistants and Nurse Practitioners) who all work together to provide you with the care you need, when you need it.  We recommend signing up for the patient portal called "MyChart".  Sign up information is provided on this After Visit Summary.  MyChart is used to connect with patients for Virtual Visits (Telemedicine).  Patients are able to view lab/test results, encounter notes, upcoming appointments, etc.  Non-urgent messages can be sent to your provider as well.   To learn more about what you can do with MyChart, go to [email or web address].    Your next appointment:   3 month(s)  The format for your next appointment:   In Person  Provider:   Richardson Dopp, PA-C         Other Instructions Your physician has requested that you regularly monitor and record your blood pressure readings at home. Please use  the same machine at the same time of day to check your readings and record them to bring to your follow-up visit.   Please monitor blood pressures and keep a log of your readings for 2 weeks and send a mychart message with these.    Make sure to check 2 hours after your medications.    AVOID these things for 30 minutes before checking your blood pressure: No Drinking caffeine. No Drinking alcohol. No Eating. No Smoking. No Exercising.   Five minutes before checking your blood pressure: Pee. Sit in a dining chair. Avoid sitting in a soft couch or armchair. Be quiet. Do not talk   Important Information About Sugar

## 2022-05-13 NOTE — Progress Notes (Signed)
Cardiology Office Note:    Date:  05/13/2022   ID:  Rionna, Feltes August 30, 1940, MRN [id number]  PCP:  Sharion Balloon, [facility] Cardiologist:  Mertie Moores, MD Cardiology APP:  Sharmon Revere    Referring MD: Sharion Balloon, FNP   Chief Complaint:  Follow-up on blood pressure    Patient Profile: Palpitations  Hypertension  Hyperlipidemia  Breast CA s/p lumpectomy (L breast)  Normal coronary arteries (cath 01/2007)     History of Present Illness:   Yakelin A Kates is a 81 y.o. female with the above problem list.  She returns for evaluation of elevated blood pressure.  Recently, her blood pressures have been elevated.  Yesterday, she had more palpitations associated with this.  She typically has issues with anxiety around this time of year.  I have recommended that she follow-up with her PCP for recommendations to manage her anxiety.  She has not had chest pain, shortness of breath or syncope.  She has not had orthopnea or leg edema.  She does limit her salt.  She has a long history of palpitations.  Her symptoms last night were a rapid/pounding sensation.  She has never worn a monitor.      EKG: NSR, HR 60, low voltage, poor wave progression, nonspecific ST-T wave changes, QTc 408    Reviewed and updated this encounter:  Tobacco  Allergies  Meds  Problems  Med Hx  Surg Hx  Fam Hx     Review of Systems  Psychiatric/Behavioral:  The patient is nervous/anxious.     Labs/Other Test Reviewed:   Recent Labs: 01/17/2022: ALT 12; BUN 10; Creatinine, Ser 0.69; Hemoglobin 13.5; Platelets 312; Potassium 3.9; Sodium 134; TSH 2.580   Recent Lipid Panel Recent Labs    01/17/22 1118  CHOL 176  TRIG 55  HDL 73  LDLCALC 92    Risk Assessment/Calculations/Metrics:         HYPERTENSION CONTROL Vitals:   05/13/22 1512 05/13/22 1654  BP: (!) 156/68 (!) 170/80    The patient's blood pressure is elevated above target today.  In order to  address the patient's elevated BP: A current anti-hypertensive medication was adjusted today.      Physical Exam:   VS:  BP (!) 170/80   Pulse 67   Ht '5\' 1"'$  (1.549 m)   Wt 114 lb 12.8 oz (52.1 kg)   SpO2 96%   BMI 21.69 kg/m    Wt Readings from Last 3 Encounters:  05/13/22 114 lb 12.8 oz (52.1 kg)  01/17/22 114 lb (51.7 kg)  06/06/21 114 lb 3.2 oz (51.8 kg)    Constitutional:      Appearance: Healthy appearance. Not in distress.  Neck:     Vascular: JVD normal.  Pulmonary:     Effort: Pulmonary effort is normal.     Breath sounds: No wheezing. No rales.  Cardiovascular:     Normal rate. Regular rhythm. Normal S1. Normal S2.      Murmurs: There is no murmur.  Edema:    Peripheral edema absent.  Abdominal:     Palpations: There is no hepatomegaly.         ASSESSMENT & PLAN:   Essential hypertension She brings in a list of her blood pressures from home.  Most of her readings are above target.  Her blood pressure is significantly elevated today.  It is usually higher in the clinic.  At last visit, consider using  amlodipine.  I will try to keep her medications simple by increasing her losartan/HCTZ to 100/25 mg daily.  Obtain follow-up BMET, magnesium in 1 week.  She will monitor her blood pressures and provide those to me for review in 1 to 2 weeks.  Follow-up in 3 months.  Palpitations She has a long history of palpitations which have been managed with beta-blocker therapy.  She describes her palpitations as a rapid or pounding sensation.  She has not noticed any significant change over the years.  I have asked her to contact me if she continues to have rapid palpitations.  At that point, I would recommend a 14-day ZIO monitor to rule out significant arrhythmias.  Her heart rate is 60.  I do not think she would tolerate increasing her dose of beta-blocker.  I did advise her that she can take an extra dose of metoprolol succinate 25 mg as needed for significantly elevated blood  pressures or prolonged palpitations.  Continue metoprolol succinate 25 mg daily.  Anxiety I have asked her to follow-up with her PCP for further management.          Dispo:  Return in about 3 months (around 08/12/2022) for Routine Follow Up, w/ Richardson Dopp, PA-C.  Medication Adjustments/Labs and Tests Ordered: Current medicines are reviewed at length with the patient today.  Concerns regarding medicines are outlined above.  Tests Ordered: Orders Placed This Encounter  Procedures   Basic metabolic panel   Magnesium   EKG 12-Lead   Medication Changes: Meds ordered this encounter  Medications   losartan-hydrochlorothiazide (HYZAAR) 100-25 MG tablet    Sig: Take 1 tablet by mouth daily.    Dispense:  90 tablet    Refill:  3   metoprolol succinate (TOPROL XL) 25 MG 24 hr tablet    Sig: Take 1 tablet by mouth daily, may take an extra tablet by mouth daily for palpitations or blood pressure over 140-90    Dispense:  180 tablet    Refill:  3   Signed, Richardson Dopp, PA-C  05/13/2022 5:00 PM    [facility] [address], [place], [place]  [address] Phone: [phone]; Fax: [phone]

## 2022-05-20 ENCOUNTER — Ambulatory Visit: Payer: Medicare Other | Attending: Physician Assistant

## 2022-05-20 DIAGNOSIS — I10 Essential (primary) hypertension: Secondary | ICD-10-CM | POA: Diagnosis not present

## 2022-05-21 LAB — BASIC METABOLIC PANEL
BUN/Creatinine Ratio: 18 (ref 12–28)
BUN: 12 mg/dL (ref 8–27)
CO2: 26 mmol/L (ref 20–29)
Calcium: 9.7 mg/dL (ref 8.7–10.3)
Chloride: 90 mmol/L — ABNORMAL LOW (ref 96–106)
Creatinine, Ser: 0.67 mg/dL (ref 0.57–1.00)
Glucose: 94 mg/dL (ref 70–99)
Potassium: 3.6 mmol/L (ref 3.5–5.2)
Sodium: 131 mmol/L — ABNORMAL LOW (ref 134–144)
eGFR: 89 mL/min/{1.73_m2} (ref >59)

## 2022-05-21 LAB — MAGNESIUM: Magnesium: 1.8 mg/dL (ref 1.6–2.3)

## 2022-05-22 ENCOUNTER — Other Ambulatory Visit: Payer: Self-pay | Admitting: *Deleted

## 2022-05-22 DIAGNOSIS — Z79899 Other long term (current) drug therapy: Secondary | ICD-10-CM

## 2022-06-04 ENCOUNTER — Ambulatory Visit: Payer: Medicare Other | Attending: Physician Assistant

## 2022-06-04 DIAGNOSIS — Z79899 Other long term (current) drug therapy: Secondary | ICD-10-CM | POA: Diagnosis not present

## 2022-06-04 LAB — BASIC METABOLIC PANEL
BUN/Creatinine Ratio: 18 (ref 12–28)
BUN: 12 mg/dL (ref 8–27)
CO2: 28 mmol/L (ref 20–29)
Calcium: 9.9 mg/dL (ref 8.7–10.3)
Chloride: 89 mmol/L — ABNORMAL LOW (ref 96–106)
Creatinine, Ser: 0.65 mg/dL (ref 0.57–1.00)
Glucose: 96 mg/dL (ref 70–99)
Potassium: 3.7 mmol/L (ref 3.5–5.2)
Sodium: 130 mmol/L — ABNORMAL LOW (ref 134–144)
eGFR: 90 mL/min/{1.73_m2} (ref >59)

## 2022-06-05 ENCOUNTER — Telehealth: Payer: Self-pay | Admitting: Physician Assistant

## 2022-06-05 NOTE — Telephone Encounter (Signed)
Called and spoke with patient about BP and hyponatremia likely from HCTZ. She states that she brought a paper in yesterday and gave it to the front office staff with her BP readings since she was last seen here on 05/13/22. Advised that this was likely placed in Scott's box and he hasn't viewed it at the time of this message to triage. Will route to SW to review log that pt dropped off. However, she states that her BP has been "all over the place" and remains around 916 systolic. She avidly checks it 2 hours after medication administration and takes her am pills around 8am. She has not checked it this morning at time of call. Advised her to continue her meds as is until she hears back from [place] (HCTZ is combined with Losartan, so can't advise her to hold it). Routing to [place] at this time.

## 2022-06-05 NOTE — Telephone Encounter (Signed)
-----   Message from Liliane Shi, [place] sent at 06/04/2022  5:31 PM EST ----- Creatinine, K+ normal. Na lower since I increased her HCTZ from 12.5 to 25 mg.  I think we should probably eliminate the HCTZ since it is causing her Na to be low PLAN:  -Ask pt how her BP is doing since we adjusted her meds at last visit? Richardson Dopp, PA-C    06/04/2022 5:29 PM

## 2022-06-06 ENCOUNTER — Other Ambulatory Visit: Payer: Self-pay

## 2022-06-06 ENCOUNTER — Inpatient Hospital Stay: Payer: Medicare Other | Attending: Adult Health | Admitting: Adult Health

## 2022-06-06 ENCOUNTER — Telehealth: Payer: Self-pay | Admitting: Physician Assistant

## 2022-06-06 VITALS — BP 170/68 | HR 62 | Temp 98.1°F | Resp 18 | Ht 61.0 in | Wt 115.1 lb

## 2022-06-06 DIAGNOSIS — C50412 Malignant neoplasm of upper-outer quadrant of left female breast: Secondary | ICD-10-CM

## 2022-06-06 DIAGNOSIS — Z923 Personal history of irradiation: Secondary | ICD-10-CM | POA: Diagnosis not present

## 2022-06-06 DIAGNOSIS — Z170 Estrogen receptor positive status [ER+]: Secondary | ICD-10-CM | POA: Diagnosis not present

## 2022-06-06 DIAGNOSIS — Z853 Personal history of malignant neoplasm of breast: Secondary | ICD-10-CM | POA: Diagnosis not present

## 2022-06-06 DIAGNOSIS — M810 Age-related osteoporosis without current pathological fracture: Secondary | ICD-10-CM | POA: Diagnosis not present

## 2022-06-06 MED ORDER — LOSARTAN POTASSIUM 100 MG PO TABS: 100.0000 mg | ORAL_TABLET | Freq: Every day | ORAL | 3 refills | Status: DC

## 2022-06-06 MED ORDER — AMLODIPINE BESYLATE 5 MG PO TABS: 5.0000 mg | ORAL_TABLET | Freq: Every day | ORAL | 3 refills | Status: DC

## 2022-06-06 NOTE — Addendum Note (Signed)
Addended by: Gaetano Net on: 06/06/2022 03:28 PM   Modules accepted: Orders

## 2022-06-06 NOTE — Telephone Encounter (Signed)
Pt has been made aware to stop Losartan/HCTZ.  She will start Losartan 100 mg daily and Amlodipine 5 mg daily.  She has been advised that 2 weeks after med changes, start monitoring BP for 1 week and send those readings in to [place] to review.  She verbalized understandingh.

## 2022-06-06 NOTE — Telephone Encounter (Signed)
Call placed to pt regarding message below.  Left a message for pt to call back.  

## 2022-06-06 NOTE — Telephone Encounter (Signed)
Reviewed BP readings dropped off by patient. Most BP readings are above goal. Some are optimal. See recent lab results. Her sodium has been low with the HCTZ and is lower on recent BMET. I think we should get her off of the HCTZ.  PLAN:  Stop Losartan/HCTZ Start Losartan 100 mg once daily Start Amlodipine 5 mg once daily. 2 weeks after med changes, start monitoring BP for 1 week and send those readings to me for review.  Richardson Dopp, PA-C    06/06/2022 11:11 AM

## 2022-06-06 NOTE — Telephone Encounter (Signed)
This is a duplicate encounter.

## 2022-06-06 NOTE — Telephone Encounter (Signed)
  Pt is returning Jennifer's call

## 2022-06-06 NOTE — Progress Notes (Signed)
[facility] Follow up:    Deborah [address], [place] [address]   DIAGNOSIS: Cancer Staging  Carcinoma of upper-outer quadrant of left female breast [facility]) Staging form: Breast, AJCC 7th Edition - Clinical: No stage assigned - Unsigned Laterality: Left - Pathologic: Stage IA (T1c, N0, cM0) - Signed by Rulon Eisenmenger, MD on 02/03/2014 Laterality: Left   SUMMARY OF ONCOLOGIC HISTORY: Oncology History  Carcinoma of upper-outer quadrant of left female breast ([place])  12/25/2010 Surgery   Left breast lumpectomy invasive ductal carcinoma with high-grade DCIS ER 80% PR 10% Ki-67 60% T1 a. N0 M0 stage IA (IDC measured 0.1 cm)   02/04/2011 - 03/06/2011 Radiation Therapy   Radiation therapy to lumpectomy site by Dr. Valere Dross   04/26/2011 - 05/11/2017 Anti-estrogen oral therapy   Arimidex 1 mg daily. Plan is to treat her for 5 years     CURRENT THERAPY:  INTERVAL HISTORY: Deborah Lowe 82 y.o. female returns for   Most recent mammogram occurred on June 26, 2021 demonstrating no mammographic evidence of malignancy and breast density category B.  She also underwent bone density testing that demonstrated a T-score of -3.4 in the AP spine consistent with osteoporosis. Patient Active Problem List   Diagnosis Date Noted  . Anxiety 05/13/2022  . Pure hypercholesterolemia 01/17/2022  . Other fatigue 01/17/2022  . History of malignant neoplasm of breast 05/28/2021  . Osteoporosis 05/28/2021  . Palpitations 01/16/2021  . Cerumen impaction 07/30/2015  . Thyroid mass 07/30/2015  . Pseudophakia of both eyes 09/05/2014  . Punctal stenosis, acquired 10/13/2013  . Tearing 10/13/2013  . Headache(784.0) 03/07/2013  . Essential hypertension 08/13/2011  . Carcinoma of upper-outer quadrant of left female breast ([place]) 02/20/2011    is allergic to avelox [moxifloxacin hcl in nacl].  MEDICAL HISTORY: Past Medical History:  Diagnosis Date  . Breast  cancer ([place]) 2012   s/p lumpectomy of left breast with XRT  . Hypertension   . Palpitations    on beta blocker therapy  . Ringing in ears   . Wears glasses     SURGICAL HISTORY: Past Surgical History:  Procedure Laterality Date  . BREAST LUMPECTOMY Left   . BREAST SURGERY    . CARDIAC CATHETERIZATION  01/29/2007  . CARDIOVASCULAR STRESS TEST  07/19/2002   EF 84%  . CHOLECYSTECTOMY    . TEAR DUCT PROBING      SOCIAL HISTORY: Social History   Socioeconomic History  . Marital status: Married    Spouse name: Coralyn Mark  . Number of children: 1  . Years of education: 11  . Highest education level: Not on file  Occupational History  . Occupation: retired  Tobacco Use  . Smoking status: Never  . Smokeless tobacco: Never  Vaping Use  . Vaping Use: Never used  Substance and Sexual Activity  . Alcohol use: No  . Drug use: No  . Sexual activity: Yes    Birth control/protection: Post-menopausal  Other Topics Concern  . Not on file  Social History Narrative  . Not on file   Social Determinants of Health   Financial Resource Strain: Low Risk  (05/28/2021)   Overall Financial Resource Strain (CARDIA)   . Difficulty of Paying Living Expenses: Not hard at all  Food Insecurity: No Food Insecurity (05/28/2021)   Hunger Vital Sign   . Worried About Charity fundraiser in the Last Year: Never true   . Ran Out of Food in  the Last Year: Never true  Transportation Needs: No Transportation Needs (05/28/2021)   PRAPARE - Transportation   . Lack of Transportation (Medical): No   . Lack of Transportation (Non-Medical): No  Physical Activity: Insufficiently Active (05/28/2021)   Exercise Vital Sign   . Days of Exercise per Week: 3 days   . Minutes of Exercise per Session: 30 min  Stress: No Stress Concern Present (05/28/2021)   [place]   . Feeling of Stress : Not at all  Social Connections: Moderately Isolated (05/28/2021)    Social Connection and Isolation Panel [NHANES]   . Frequency of Communication with Friends and Family: More than three times a week   . Frequency of Social Gatherings with Friends and Family: More than three times a week   . Attends Religious Services: Never   . Active Member of Clubs or Organizations: No   . Attends Archivist Meetings: Never   . Marital Status: Married  Human resources officer Violence: Not At Risk (05/28/2021)   Humiliation, Afraid, Rape, and Kick questionnaire   . Fear of Current or Ex-Partner: No   . Emotionally Abused: No   . Physically Abused: No   . Sexually Abused: No    FAMILY HISTORY: Family History  Problem Relation Age of Onset  . Colon cancer Mother   . Heart disease Father   . Bone cancer Brother     Review of Systems - Oncology    PHYSICAL EXAMINATION  ECOG PERFORMANCE STATUS: {CHL ONC ECOG ZO:[id number]}  Vitals:   06/06/22 1114  BP: (!) 170/68  Pulse: 62  Resp: 18  Temp: 98.1 F (36.7 C)  SpO2: 100%    Physical Exam  LABORATORY DATA:  CBC    Component Value Date/Time   WBC 5.9 01/17/2022 1118   WBC 6.7 05/17/2015 1107   WBC 5.7 12/07/2012 1029   RBC 4.61 01/17/2022 1118   RBC 4.60 05/17/2015 1107   RBC 4.74 12/07/2012 1029   HGB 13.5 01/17/2022 1118   HGB 13.5 05/17/2015 1107   HCT 41.9 01/17/2022 1118   HCT 39.4 05/17/2015 1107   PLT 312 01/17/2022 1118   MCV 91 01/17/2022 1118   MCV 85.7 05/17/2015 1107   MCH 29.3 01/17/2022 1118   MCH 29.3 05/17/2015 1107   MCH 29.4 12/20/2010 1209   MCHC 32.2 01/17/2022 1118   MCHC 34.3 05/17/2015 1107   MCHC 34.0 12/07/2012 1029   RDW 12.6 01/17/2022 1118   RDW 12.7 05/17/2015 1107   LYMPHSABS 1.9 05/17/2015 1107   MONOABS 0.6 05/17/2015 1107   EOSABS 0.1 05/17/2015 1107   BASOSABS 0.0 05/17/2015 1107    CMP     Component Value Date/Time   NA 130 (L) 06/04/2022 0938   NA 133 (L) 05/17/2015 1107   K 3.7 06/04/2022 0938   K 3.6 05/17/2015 1107   CL 89 (L)  06/04/2022 0938   CL 97 (L) 08/04/2012 1010   CO2 28 06/04/2022 0938   CO2 28 05/17/2015 1107   GLUCOSE 96 06/04/2022 0938   GLUCOSE 101 (H) 12/05/2015 0953   GLUCOSE 108 05/17/2015 1107   GLUCOSE 94 08/04/2012 1010   BUN 12 06/04/2022 0938   BUN 9.9 05/17/2015 1107   CREATININE 0.65 06/04/2022 0938   CREATININE 0.70 12/05/2015 0953   CREATININE 0.8 05/17/2015 1107   CALCIUM 9.9 06/04/2022 0938   CALCIUM 9.5 05/17/2015 1107   PROT 6.6 01/17/2022 1118   PROT 7.0 05/17/2015  1107   ALBUMIN 4.7 01/17/2022 1118   ALBUMIN 4.0 05/17/2015 1107   AST 10 01/17/2022 1118   AST 12 05/17/2015 1107   ALT 12 01/17/2022 1118   ALT 11 05/17/2015 1107   ALKPHOS 74 01/17/2022 1118   ALKPHOS 71 05/17/2015 1107   BILITOT 1.3 (H) 01/17/2022 1118   BILITOT 0.91 05/17/2015 1107   GFRNONAA 88 01/17/2020 1123   GFRAA 102 01/17/2020 1123       PENDING LABS:   RADIOGRAPHIC STUDIES:  No results found.   PATHOLOGY:     ASSESSMENT and THERAPY PLAN:   No problem-specific Assessment & Plan notes found for this encounter.   No orders of the defined types were placed in this encounter.   All questions were answered. The patient knows to call the clinic with any problems, questions or concerns. We can certainly see the patient much sooner if necessary. This note was electronically signed. Scot Dock, NP 06/06/2022

## 2022-06-09 ENCOUNTER — Encounter: Payer: Self-pay | Admitting: Adult Health

## 2022-06-09 NOTE — Assessment & Plan Note (Signed)
Deborah Lowe is here today for f/u of her h/o breast cancer.  She has no clinical or radiographic signs of breast cancer recurrence.  She will proceed with her next mammogram in February 2024.  We discussed her osteoporosis I recommended continued follow-up with her primary care, and bone health including calcium, vitamin D, and weightbearing exercises.  I recommended that she stay up-to-date with her health maintenance, and encouraged healthy diet and exercise.  We will see her back in 1 year for continued follow-up and surveillance.

## 2022-06-23 DIAGNOSIS — Z1151 Encounter for screening for human papillomavirus (HPV): Secondary | ICD-10-CM | POA: Diagnosis not present

## 2022-06-23 DIAGNOSIS — Z124 Encounter for screening for malignant neoplasm of cervix: Secondary | ICD-10-CM | POA: Diagnosis not present

## 2022-06-23 DIAGNOSIS — Z6822 Body mass index (BMI) 22.0-22.9, adult: Secondary | ICD-10-CM | POA: Diagnosis not present

## 2022-06-24 ENCOUNTER — Telehealth: Payer: Self-pay | Admitting: Physician Assistant

## 2022-06-24 MED ORDER — DOXAZOSIN MESYLATE 1 MG PO TABS: 1.0000 mg | ORAL_TABLET | Freq: Every day | ORAL | 11 refills | Status: DC

## 2022-06-24 MED ORDER — AMLODIPINE BESYLATE 5 MG PO TABS: 2.5000 mg | ORAL_TABLET | Freq: Every day | ORAL | Status: DC

## 2022-06-24 NOTE — Telephone Encounter (Signed)
Home BP readings received and reviewed. Most readings are somewhat above goal. Notes on home BP readings indicate she is having some swelling in her ankles. I suspect this is from the amlodipine. Options are s/w limited.  She has a hx of bradycardia, so we are not able to increase the daily dose of Toprol XL. She had issues with low sodium with HCTZ, so it is best to avoid diuretics if possible.  Hydralazine is a choice but has to be taken 3 x a day. Clonidine is another choice but has side effects that are sometimes difficult to tolerate. Doxazosin is a good choice and can be dosed once a day. The first couple of doses can cause orthostatic hypotension in the first few hours after taking it. So it is best to take before bed to not experience.  PLAN:  - Decrease Amlodipine to 2.5 mg once daily  - Add Doxazosin 1 mg once daily at bedtime. - Continue Losartan 100 mg once daily  - If swelling is not controlled on lower dose Amlodipine, let me know - Call with BP readings again in 2 weeks.   Richardson Dopp, PA-C    06/24/2022 4:03 PM

## 2022-06-24 NOTE — Telephone Encounter (Signed)
Spoke with patient and share Richardson Dopp, PA-C's recommendations:  PLAN:  - Decrease Amlodipine to 2.5 mg once daily  - Add Doxazosin 1 mg once daily at bedtime. - Continue Losartan 100 mg once daily  - If swelling is not controlled on lower dose Amlodipine, let me know - Call with BP readings again in 2 weeks.   Medication list updated, Doxazosin sent to pharmacy. Patient verbalized understanding of the above and expressed appreciation for call.

## 2022-07-10 ENCOUNTER — Telehealth: Payer: Self-pay | Admitting: Cardiovascular Disease

## 2022-07-10 NOTE — Telephone Encounter (Signed)
Unable to leave a message.

## 2022-07-10 NOTE — Telephone Encounter (Signed)
Pt c/o medication issue:  1. Name of Medication:   doxazosin (CARDURA) 1 MG tablet   2. How are you currently taking this medication (dosage and times per day)?   As prescribed  3. Are you having a reaction (difficulty breathing--STAT)?   4. What is your medication issue?   Patient stated she has not been feeling right with this medication.  Patient stated the medication makes her feel woozy and bad.  Patient reported her BP yesterday was 172/61 and today 155/65.  Patient would like an alternate medication.

## 2022-07-11 NOTE — Telephone Encounter (Signed)
BP remains uncontrolled. Pt unable to tolerate Doxazosin. Stop the Doxazosin. Valsartan is more potent than Losartan. If she is ok switching, we can: Stop Losartan Start Valsartan 160 mg once daily  BMET 1 month If she would rather stay on Losartan: Continue Losartan 100 mg once daily  Start Hydralazine 25 mg three times a day. Richardson Dopp, PA-C    07/11/2022 10:04 AM

## 2022-07-11 NOTE — Telephone Encounter (Signed)
[place], PA-C    07/11/2022 12:30 PM

## 2022-07-11 NOTE — Telephone Encounter (Signed)
Returned the call to pt.  She was made aware that we wanted to make some medication changes based upon her call. Per pt, she is feeling fine now, bp is better, down to 130/74.  She took the medication last night and this morning and she states she is feeling fine and would like to continue on what she is on.

## 2022-07-24 ENCOUNTER — Telehealth: Payer: Self-pay | Admitting: Physician Assistant

## 2022-07-24 MED ORDER — DOXAZOSIN MESYLATE 2 MG PO TABS: 2.0000 mg | ORAL_TABLET | Freq: Every day | ORAL | 3 refills | Status: DC

## 2022-07-24 NOTE — Telephone Encounter (Signed)
Called pt in regards to BP and swelling.  Reports the following SBP readings:  Sat. [place] 154 Tues. 153 Wed. 160 Pt uses a wrist cuff and reports it was checked about a month ago for accuracy.   BP readings are from different times of the day.  Advised pt to check BP 1.5 to 2 hours after taking med.  Also advised of proper technique for checking BP.  Reviewed meds pt is taking Norvasc 2.5 mg and losartan 100 mg Qam. Doxazosin '1mg'$ , metoprolol succinate 25 mg QHS.  Pt is not taking an extra dose of metoprolol for BP > 140.  Advised pt to take medication as needed and keep records of how BP responds.  Pt reports will start.  Also reports pharmacy has been filling generic metoprolol instead of name brand. Generic does not work for her.  Pharmacy will fill brand name pt thinks this will help with BP issues.  Also notes ankle swelling has improved with decrease in amlodipine to 2.5 but still remains.  Ankles are normal when first wakes up then swells throughout the day.  Will send to Provider to address.

## 2022-07-24 NOTE — Telephone Encounter (Signed)
Called pt advised of Providers recommendations: Increase Doxazosin to 2 mg once daily. If BP > 170/100, repeat BP 15 min later. If still > 170/100, take extra Amlodipine 2.5 mg x 1. Monitor BP as outlined - about 2 hours after meds; seated with feet on floor; resting for at least 15-20 minutes before checking. Richardson Dopp, PA-C    07/24/2022 11:22 AM     Pt wrote down instructions and read back to RN.  All questions answered.

## 2022-07-24 NOTE — Telephone Encounter (Signed)
Pt states she is having some BP and swelling. She denied any dizziness or headache. She is requesting to speak to RN

## 2022-07-24 NOTE — Telephone Encounter (Signed)
Increase Doxazosin to 2 mg once daily. If BP > 170/100, repeat BP 15 min later. If still > 170/100, take extra Amlodipine 2.5 mg x 1. Monitor BP as outlined - about 2 hours after meds; seated with feet on floor; resting for at least 15-20 minutes before checking. Richardson Dopp, PA-C    07/24/2022 11:22 AM

## 2022-07-30 ENCOUNTER — Ambulatory Visit (INDEPENDENT_AMBULATORY_CARE_PROVIDER_SITE_OTHER): Payer: Medicare Other

## 2022-07-30 VITALS — Ht 61.0 in | Wt 112.0 lb

## 2022-07-30 DIAGNOSIS — Z0000 Encounter for general adult medical examination without abnormal findings: Secondary | ICD-10-CM | POA: Diagnosis not present

## 2022-07-30 NOTE — Patient Instructions (Signed)
Deborah Lowe , Thank you for taking time to come for your Medicare Wellness Visit. I appreciate your ongoing commitment to your health goals. Please review the following plan we discussed and let me know if I can assist you in the future.   These are the goals we discussed:  Goals      Exercise 150 min/wk Moderate Activity     Exercise daily Be consistent        This is a list of the screening recommended for you and due dates:  Health Maintenance  Topic Date Due   Hepatitis C Screening: USPSTF Recommendation to screen - Ages 8-79 yo.  Never done   DTaP/Tdap/Td vaccine (1 - Tdap) Never done   Zoster (Shingles) Vaccine (1 of 2) Never done   COVID-19 Vaccine (7 - 2023-24 season) 04/23/2022   Medicare Annual Wellness Visit  07/30/2023   Pneumonia Vaccine  Completed   Flu Shot  Completed   DEXA scan (bone density measurement)  Completed   HPV Vaccine  Aged Out   Colon Cancer Screening  Discontinued    Advanced directives: Advance directive discussed with you today. I have provided a copy for you to complete at home and have notarized. Once this is complete please bring a copy in to our office so we can scan it into your chart.   Conditions/risks identified: Aim for 30 minutes of exercise or brisk walking, 6-8 glasses of water, and 5 servings of fruits and vegetables each day.   Next appointment: Follow up in one year for your annual wellness visit    Preventive Care 65 Years and Older, Female Preventive care refers to lifestyle choices and visits with your health care provider that can promote health and wellness. What does preventive care include? A yearly physical exam. This is also called an annual well check. Dental exams once or twice a year. Routine eye exams. Ask your health care provider how often you should have your eyes checked. Personal lifestyle choices, including: Daily care of your teeth and gums. Regular physical activity. Eating a healthy diet. Avoiding tobacco  and drug use. Limiting alcohol use. Practicing safe sex. Taking low-dose aspirin every day. Taking vitamin and mineral supplements as recommended by your health care provider. What happens during an annual well check? The services and screenings done by your health care provider during your annual well check will depend on your age, overall health, lifestyle risk factors, and family history of disease. Counseling  Your health care provider may ask you questions about your: Alcohol use. Tobacco use. Drug use. Emotional well-being. Home and relationship well-being. Sexual activity. Eating habits. History of falls. Memory and ability to understand (cognition). Work and work Statistician. Reproductive health. Screening  You may have the following tests or measurements: Height, weight, and BMI. Blood pressure. Lipid and cholesterol levels. These may be checked every 5 years, or more frequently if you are over 26 years old. Skin check. Lung cancer screening. You may have this screening every year starting at age 53 if you have a 30-pack-year history of smoking and currently smoke or have quit within the past 15 years. Fecal occult blood test (FOBT) of the stool. You may have this test every year starting at age 64. Flexible sigmoidoscopy or colonoscopy. You may have a sigmoidoscopy every 5 years or a colonoscopy every 10 years starting at age 64. Hepatitis C blood test. Hepatitis B blood test. Sexually transmitted disease (STD) testing. Diabetes screening. This is done by checking your  blood sugar (glucose) after you have not eaten for a while (fasting). You may have this done every 1-3 years. Bone density scan. This is done to screen for osteoporosis. You may have this done starting at age 4. Mammogram. This may be done every 1-2 years. Talk to your health care provider about how often you should have regular mammograms. Talk with your health care provider about your test results,  treatment options, and if necessary, the need for more tests. Vaccines  Your health care provider may recommend certain vaccines, such as: Influenza vaccine. This is recommended every year. Tetanus, diphtheria, and acellular pertussis (Tdap, Td) vaccine. You may need a Td booster every 10 years. Zoster vaccine. You may need this after age 23. Pneumococcal 13-valent conjugate (PCV13) vaccine. One dose is recommended after age 57. Pneumococcal polysaccharide (PPSV23) vaccine. One dose is recommended after age 46. Talk to your health care provider about which screenings and vaccines you need and how often you need them. This information is not intended to replace advice given to you by your health care provider. Make sure you discuss any questions you have with your health care provider. Document Released: 06/08/2015 Document Revised: 01/30/2016 Document Reviewed: 03/13/2015 Elsevier Interactive Patient Education  2017 [place] Prevention in the Home Falls can cause injuries. They can happen to people of all ages. There are many things you can do to make your home safe and to help prevent falls. What can I do on the outside of my home? Regularly fix the edges of walkways and driveways and fix any cracks. Remove anything that might make you trip as you walk through a door, such as a raised step or threshold. Trim any bushes or trees on the path to your home. Use bright outdoor lighting. Clear any walking paths of anything that might make someone trip, such as rocks or tools. Regularly check to see if handrails are loose or broken. Make sure that both sides of any steps have handrails. Any raised decks and porches should have guardrails on the edges. Have any leaves, snow, or ice cleared regularly. Use sand or salt on walking paths during winter. Clean up any spills in your garage right away. This includes oil or grease spills. What can I do in the bathroom? Use night  lights. Install grab bars by the toilet and in the tub and shower. Do not use towel bars as grab bars. Use non-skid mats or decals in the tub or shower. If you need to sit down in the shower, use a plastic, non-slip stool. Keep the floor dry. Clean up any water that spills on the floor as soon as it happens. Remove soap buildup in the tub or shower regularly. Attach bath mats securely with double-sided non-slip rug tape. Do not have throw rugs and other things on the floor that can make you trip. What can I do in the bedroom? Use night lights. Make sure that you have a light by your bed that is easy to reach. Do not use any sheets or blankets that are too big for your bed. They should not hang down onto the floor. Have a firm chair that has side arms. You can use this for support while you get dressed. Do not have throw rugs and other things on the floor that can make you trip. What can I do in the kitchen? Clean up any spills right away. Avoid walking on wet floors. Keep items that you use a lot in  easy-to-reach places. If you need to reach something above you, use a strong step stool that has a grab bar. Keep electrical cords out of the way. Do not use floor polish or wax that makes floors slippery. If you must use wax, use non-skid floor wax. Do not have throw rugs and other things on the floor that can make you trip. What can I do with my stairs? Do not leave any items on the stairs. Make sure that there are handrails on both sides of the stairs and use them. Fix handrails that are broken or loose. Make sure that handrails are as long as the stairways. Check any carpeting to make sure that it is firmly attached to the stairs. Fix any carpet that is loose or worn. Avoid having throw rugs at the top or bottom of the stairs. If you do have throw rugs, attach them to the floor with carpet tape. Make sure that you have a light switch at the top of the stairs and the bottom of the stairs. If  you do not have them, ask someone to add them for you. What else can I do to help prevent falls? Wear shoes that: Do not have high heels. Have rubber bottoms. Are comfortable and fit you well. Are closed at the toe. Do not wear sandals. If you use a stepladder: Make sure that it is fully opened. Do not climb a closed stepladder. Make sure that both sides of the stepladder are locked into place. Ask someone to hold it for you, if possible. Clearly mark and make sure that you can see: Any grab bars or handrails. First and last steps. Where the edge of each step is. Use tools that help you move around (mobility aids) if they are needed. These include: Canes. Walkers. Scooters. Crutches. Turn on the lights when you go into a dark area. Replace any light bulbs as soon as they burn out. Set up your furniture so you have a clear path. Avoid moving your furniture around. If any of your floors are uneven, fix them. If there are any pets around you, be aware of where they are. Review your medicines with your doctor. Some medicines can make you feel dizzy. This can increase your chance of falling. Ask your doctor what other things that you can do to help prevent falls. This information is not intended to replace advice given to you by your health care provider. Make sure you discuss any questions you have with your health care provider. Document Released: 03/08/2009 Document Revised: 10/18/2015 Document Reviewed: 06/16/2014 Elsevier Interactive Patient Education  2017 [employer].

## 2022-07-30 NOTE — Progress Notes (Signed)
Subjective:   Deborah Lowe is a 82 y.o. female who presents for Medicare Annual (Subsequent) preventive examination.  I connected with  [name] on 07/30/22 by a audio enabled telemedicine application and verified that I am speaking with the correct person using two identifiers.  Patient Location: Home  Provider Location: Home Office  I discussed the limitations of evaluation and management by telemedicine. The patient expressed understanding and agreed to proceed.  Review of Systems     Cardiac Risk Factors include: advanced age (>60mn, >>41women);hypertension     Objective:    Today's Vitals   07/30/22 1131  Weight: 112 lb (50.8 kg)  Height: '5\' 1"'$  (1.549 m)   Body mass index is 21.16 kg/m.     07/30/2022   11:39 AM 05/28/2021    9:09 AM 05/15/2016   11:41 AM 11/16/2014   11:31 AM 08/04/2014    9:52 AM  Advanced Directives  Does Patient Have a Medical Advance Directive? No No No No No  Would patient like information on creating a medical advance directive? Yes (MAU/Ambulatory/Procedural Areas - Information given) No - Patient declined   No - patient declined information    Current Medications (verified) Outpatient Encounter Medications as of 07/30/2022  Medication Sig   amLODipine (NORVASC) 5 MG tablet Take 0.5 tablets (2.5 mg total) by mouth daily.   calcium-vitamin D (OSCAL WITH D) 250-125 MG-UNIT tablet Take 1 tablet by mouth daily.   doxazosin (CARDURA) 2 MG tablet Take 1 tablet (2 mg total) by mouth daily.   losartan (COZAAR) 100 MG tablet Take 1 tablet (100 mg total) by mouth daily.   metoprolol succinate (TOPROL XL) 25 MG 24 hr tablet Take 1 tablet by mouth daily, may take an extra tablet by mouth daily for palpitations or blood pressure over 140-90   No facility-administered encounter medications on file as of 07/30/2022.    Allergies (verified) Avelox [moxifloxacin hcl in nacl]   History: Past Medical History:  Diagnosis Date   [place]   s/p lumpectomy of left breast with XRT   Hypertension    Palpitations    on beta blocker therapy   Ringing in ears    Wears glasses    Past Surgical History:  Procedure Laterality Date   BREAST LUMPECTOMY Left    BREAST SURGERY     CARDIAC CATHETERIZATION  01/29/2007   CARDIOVASCULAR STRESS TEST  07/19/2002   EF 84%   CHOLECYSTECTOMY     TEAR DUCT PROBING     Family History  Problem Relation Age of Onset   Colon cancer Mother    Heart disease Father    Bone cancer Brother    Social History   Socioeconomic History   Marital status: Married    Spouse name: TCoralyn Mark  Number of children: 1   Years of education: 12   Highest education level: Not on file  Occupational History   Occupation: retired  Tobacco Use   Smoking status: Never   Smokeless tobacco: Never  Vaping Use   Vaping Use: Never used  Substance and Sexual Activity   Alcohol use: No   Drug use: No   Sexual activity: Yes    Birth control/protection: Post-menopausal  Other Topics Concern   Not on file  Social History Narrative   One daughter   Social Determinants of Health   Financial Resource Strain: Low Risk  (07/30/2022)   Overall Financial Resource Strain (CARDIA)    Difficulty  of Paying Living Expenses: Not hard at all  Food Insecurity: No Food Insecurity (07/30/2022)   Hunger Vital Sign    Worried About Running Out of Food in the Last Year: Never true    Ran Out of Food in the Last Year: Never true  Transportation Needs: No Transportation Needs (07/30/2022)   PRAPARE - Hydrologist (Medical): No    Lack of Transportation (Non-Medical): No  Physical Activity: Insufficiently Active (07/30/2022)   Exercise Vital Sign    Days of Exercise per Week: 3 days    Minutes of Exercise per Session: 30 min  Stress: No Stress Concern Present (07/30/2022)   [place]    Feeling of Stress : Not at all  Social  Connections: Moderately Isolated (07/30/2022)   Social Connection and Isolation Panel [NHANES]    Frequency of Communication with Friends and Family: More than three times a week    Frequency of Social Gatherings with Friends and Family: More than three times a week    Attends Religious Services: Never    Marine scientist or Organizations: No    Attends Music therapist: Never    Marital Status: Married    Tobacco Counseling Counseling given: Not Answered   Clinical Intake:  Pre-visit preparation completed: Yes  Pain : No/denies pain  Diabetes: No  How often do you need to have someone help you when you read instructions, pamphlets, or other written materials from your doctor or pharmacy?: 1 - Never  Diabetic?No   Interpreter Needed?: No  Information entered by :: Denman George LPN   Activities of Daily Living    07/30/2022   11:39 AM  In your present state of health, do you have any difficulty performing the following activities:  Hearing? 0  Vision? 0  Difficulty concentrating or making decisions? 0  Walking or climbing stairs? 0  Dressing or bathing? 0  Doing errands, shopping? 0  Preparing Food and eating ? N  Using the Toilet? N  In the past six months, have you accidently leaked urine? N  Do you have problems with loss of bowel control? N  Managing your Medications? N  Managing your Finances? N  Housekeeping or managing your Housekeeping? N    Patient Care Team: Sharion Balloon, FNP as PCP - General (Family Medicine) Nahser, Wonda Cheng, MD as PCP - Cardiology (Cardiology) Molli Posey, MD as Consulting Physician (Obstetrics and Gynecology) Steffanie Rainwater, DPM as Consulting Physician (Podiatry) Yvone Neu, MD as Consulting Physician (Dermatology) Delice Bison, Charlestine Massed, NP as Nurse Practitioner (Hematology and Oncology) Sharmon Revere as Physician Assistant (Cardiology)  Indicate any recent Medical Services you may have  received from other than [facility] providers in the past year (date may be approximate).     Assessment:   This is a routine wellness examination for Deborah Lowe.  Hearing/Vision screen Hearing Screening - Comments:: Hard of hearing  Vision Screening - Comments:: Wears rx glasses - up to date with routine eye exams with [place] issues and exercise activities discussed: Current Exercise Habits: Home exercise routine, Type of exercise: walking, Time (Minutes): 30, Frequency (Times/Week): 3, Weekly Exercise (Minutes/Week): 90, Intensity: Mild   Goals Addressed   None   Depression Screen    07/30/2022   11:38 AM 05/28/2021    9:07 AM 10/13/2017    9:56 AM 08/20/2017    5:41 PM 06/16/2016  2:42 PM 12/13/2015    3:57 PM 09/21/2015   10:10 AM  PHQ 2/9 Scores  PHQ - 2 Score 0 0 1 0 0 0 0    Fall Risk    07/30/2022   11:36 AM 05/28/2021    9:10 AM 04/19/2019   12:34 PM 10/13/2017    9:56 AM 08/20/2017    5:41 PM  Fall Risk   Falls in the past year? 0 0 0 No No  Comment   Emmi Telephone Survey: data to providers prior to load    Number falls in past yr: 0 0     Injury with Fall? 0 0     Risk for fall due to : No Fall Risks No Fall Risks     Follow up Falls prevention discussed;Education provided;Falls evaluation completed Falls prevention discussed       FALL RISK PREVENTION PERTAINING TO THE HOME:  Any stairs in or around the home? No  If so, are there any without handrails? No  Home free of loose throw rugs in walkways, pet beds, electrical cords, etc? Yes  Adequate lighting in your home to reduce risk of falls? Yes   ASSISTIVE DEVICES UTILIZED TO PREVENT FALLS:  Life alert? No  Use of a cane, walker or w/c? No  Grab bars in the bathroom? Yes  Shower chair or bench in shower? No  Elevated toilet seat or a handicapped toilet? Yes   TIMED UP AND GO:  Was the test performed? No . Telephonic visit   Cognitive Function:        07/30/2022   11:39 AM 05/28/2021    9:18  AM  6CIT Screen  What Year? 0 points 0 points  What month? 0 points 0 points  What time? 0 points 0 points  Count back from 20 0 points 0 points  Months in reverse 0 points 0 points  Repeat phrase 0 points 6 points  Total Score 0 points 6 points    Immunizations Immunization History  Administered Date(s) Administered   Covid-19, Mrna,Vaccine(Spikevax)83yr and older 02/26/2022   Fluad Quad(high Dose 65+) 03/17/2019, 03/27/2020   Influenza, High Dose Seasonal PF 04/22/2017, 03/10/2018   Influenza-Unspecified 04/01/2021, 03/17/2022   Moderna Covid-19 Vaccine Bivalent Booster 159yr& up 04/01/2021   Moderna SARS-COV2 Booster Vaccination 10/15/2020   PFIZER Comirnaty(Gray Top)Covid-19 Tri-Sucrose Vaccine 10/15/2020   Pneumococcal Conjugate-13 03/19/2015   Pneumococcal Polysaccharide-23 03/31/2016   Unspecified SARS-COV-2 Vaccination 06/16/2019, 07/07/2019, 02/18/2020    TDAP status: Due, Education has been provided regarding the importance of this vaccine. Advised may receive this vaccine at local pharmacy or Health Dept. Aware to provide a copy of the vaccination record if obtained from local pharmacy or Health Dept. Verbalized acceptance and understanding.  Flu Vaccine status: Up to date  Pneumococcal vaccine status: Up to date  Covid-19 vaccine status: Information provided on how to obtain vaccines.   Qualifies for Shingles Vaccine? Yes   Zostavax completed No   Shingrix Completed?: No.    Education has been provided regarding the importance of this vaccine. Patient has been advised to call insurance company to determine out of pocket expense if they have not yet received this vaccine. Advised may also receive vaccine at local pharmacy or Health Dept. Verbalized acceptance and understanding.  Screening Tests Health Maintenance  Topic Date Due   Hepatitis C Screening  Never done   DTaP/Tdap/Td (1 - Tdap) Never done   Zoster Vaccines- Shingrix (1 of 2) Never done   COVID-19  Vaccine (7 - 2023-24 season) 04/23/2022   Medicare Annual Wellness (AWV)  07/30/2023   Pneumonia Vaccine 60+ Years old  Completed   INFLUENZA VACCINE  Completed   DEXA SCAN  Completed   HPV VACCINES  Aged Out   COLONOSCOPY (Pts 45-54yr Insurance coverage will need to be confirmed)  Discontinued    Health Maintenance  Health Maintenance Due  Topic Date Due   Hepatitis C Screening  Never done   DTaP/Tdap/Td (1 - Tdap) Never done   Zoster Vaccines- Shingrix (1 of 2) Never done   COVID-19 Vaccine (7 - 2023-24 season) 04/23/2022    Colorectal cancer screening: No longer required.   Mammogram status: No longer required due to age.  Bone Density status: Completed 06/26/21. Results reflect: Bone density results: OSTEOPOROSIS. Repeat every 2 years.  Lung Cancer Screening: (Low Dose CT Chest recommended if Age 80-80years, 30 pack-year currently smoking OR have quit w/in 15years.) does not qualify.   Lung Cancer Screening Referral: n/a  Additional Screening:  Hepatitis C Screening: does qualify  Vision Screening: Recommended annual ophthalmology exams for early detection of glaucoma and other disorders of the eye. Is the patient up to date with their annual eye exam?  Yes  Who is the provider or what is the name of the [facility] which the patient [facility]  If pt is not established with a provider, would they like to be referred to a provider to establish care? No .   Dental Screening: Recommended annual dental exams for proper oral hygiene  Community Resource Referral / Chronic Care Management: CRR required this visit?  No   CCM required this visit?  No      Plan:     I have personally reviewed and noted the following in the patient's chart:   Medical and social history Use of alcohol, tobacco or illicit drugs  Current medications and supplements including opioid prescriptions. Patient is not currently taking opioid prescriptions. Functional  ability and status Nutritional status Physical activity Advanced directives List of other physicians Hospitalizations, surgeries, and ER visits in previous 12 months Vitals Screenings to include cognitive, depression, and falls Referrals and appointments  In addition, I have reviewed and discussed with patient certain preventive protocols, quality metrics, and best practice recommendations. A written personalized care plan for preventive services as well as general preventive health recommendations were provided to patient.     SVanetta Mulders [place]  [id number]  Due to this being a virtual visit, the after visit summary with patients personalized plan was offered to patient via mail or my-chart.  per request, patient was mailed a copy of AVS./  Nurse Notes: Patient would like to switch pcp to Dr.Gottschalk

## 2022-08-07 ENCOUNTER — Other Ambulatory Visit: Payer: Self-pay

## 2022-08-07 ENCOUNTER — Encounter (HOSPITAL_BASED_OUTPATIENT_CLINIC_OR_DEPARTMENT_OTHER): Payer: Self-pay

## 2022-08-07 ENCOUNTER — Emergency Department (HOSPITAL_BASED_OUTPATIENT_CLINIC_OR_DEPARTMENT_OTHER): Payer: Medicare Other | Admitting: Radiology

## 2022-08-07 ENCOUNTER — Telehealth: Payer: Self-pay | Admitting: Physician Assistant

## 2022-08-07 ENCOUNTER — Emergency Department (HOSPITAL_BASED_OUTPATIENT_CLINIC_OR_DEPARTMENT_OTHER)
Admission: EM | Admit: 2022-08-07 | Discharge: 2022-08-07 | Disposition: A | Discharge status: Home / Self Care | Payer: Medicare Other | Attending: Emergency Medicine | Admitting: Emergency Medicine

## 2022-08-07 ENCOUNTER — Telehealth: Payer: Self-pay

## 2022-08-07 DIAGNOSIS — R002 Palpitations: Secondary | ICD-10-CM | POA: Diagnosis not present

## 2022-08-07 DIAGNOSIS — Z79899 Other long term (current) drug therapy: Secondary | ICD-10-CM | POA: Diagnosis not present

## 2022-08-07 DIAGNOSIS — I4891 Unspecified atrial fibrillation: Secondary | ICD-10-CM | POA: Insufficient documentation

## 2022-08-07 DIAGNOSIS — I10 Essential (primary) hypertension: Secondary | ICD-10-CM | POA: Diagnosis not present

## 2022-08-07 DIAGNOSIS — Z7901 Long term (current) use of anticoagulants: Secondary | ICD-10-CM | POA: Diagnosis not present

## 2022-08-07 DIAGNOSIS — R600 Localized edema: Secondary | ICD-10-CM | POA: Insufficient documentation

## 2022-08-07 DIAGNOSIS — R000 Tachycardia, unspecified: Secondary | ICD-10-CM | POA: Diagnosis not present

## 2022-08-07 LAB — BASIC METABOLIC PANEL
Anion gap: 12 (ref 5–15)
BUN: 11 mg/dL (ref 8–23)
CO2: 25 mmol/L (ref 22–32)
Calcium: 10 mg/dL (ref 8.9–10.3)
Chloride: 102 mmol/L (ref 98–111)
Creatinine, Ser: 0.62 mg/dL (ref 0.44–1.00)
GFR, Estimated: >60 mL/min (ref >60)
Glucose, Bld: 102 mg/dL — ABNORMAL HIGH (ref 70–99)
Potassium: 3.7 mmol/L (ref 3.5–5.1)
Sodium: 139 mmol/L (ref 135–145)

## 2022-08-07 LAB — TSH: TSH: 5.309 u[IU]/mL — ABNORMAL HIGH (ref 0.350–4.500)

## 2022-08-07 LAB — CBC
HCT: 42.1 % (ref 36.0–46.0)
Hemoglobin: 14.1 g/dL (ref 12.0–15.0)
MCH: 29.6 pg (ref 26.0–34.0)
MCHC: 33.5 g/dL (ref 30.0–36.0)
MCV: 88.4 fL (ref 80.0–100.0)
Platelets: 259 10*3/uL (ref 150–400)
RBC: 4.76 MIL/uL (ref 3.87–5.11)
RDW: 12.9 % (ref 11.5–15.5)
WBC: 7.2 10*3/uL (ref 4.0–10.5)
nRBC: 0 % (ref 0.0–0.2)

## 2022-08-07 LAB — TROPONIN I (HIGH SENSITIVITY): Troponin I (High Sensitivity): 12 ng/L (ref <18)

## 2022-08-07 LAB — MAGNESIUM: Magnesium: 2 mg/dL (ref 1.7–2.4)

## 2022-08-07 MED ORDER — METOPROLOL SUCCINATE ER 25 MG PO TB24: 25.0000 mg | ORAL_TABLET | Freq: Two times a day (BID) | ORAL | 0 refills | Status: DC

## 2022-08-07 MED ORDER — APIXABAN 5 MG PO TABS: 5.0000 mg | ORAL_TABLET | Freq: Two times a day (BID) | ORAL | 0 refills | Status: DC

## 2022-08-07 NOTE — ED Provider Notes (Signed)
[facility] Provider Note   CSN: [id number] Arrival date & time: 08/07/22  1814     History  Chief Complaint  Patient presents with   Palpitations    Deborah Lowe is a 82 y.o. female.  82 year old female with a history of hypertension and palpitations on metoprolol 25 mg daily presents emergency department with palpitations, shortness of breath, chest discomfort, and dizziness.  Reports that her symptoms started this afternoon.  She called her cardiologist when she found her heart rate was 50 bpm at home.  Took an extra dose of her 25 mg of metoprolol and came into the emergency department.  While in triage was noted to be tachycardic in the 150s as well.  Came back to the room and reported that her symptoms had resolved and is now back in normal sinus rhythm.  Denies any heavy alcohol use.  Drinks decaffeinated coffee only.  Has a history of breast cancer in remission.  Is not currently on anticoagulation or aspirin.  Denies any other recent illnesses.       Home Medications Prior to Admission medications   Medication Sig Start Date End Date Taking? Authorizing Provider  apixaban (ELIQUIS) 5 MG TABS tablet Take 1 tablet (5 mg total) by mouth 2 (two) times daily. 08/07/22 09/06/22 Yes Fransico Meadow, MD  amLODipine (NORVASC) 5 MG tablet Take 0.5 tablets (2.5 mg total) by mouth daily. 06/24/22   Richardson Dopp T, PA-C  calcium-vitamin D (OSCAL WITH D) 250-125 MG-UNIT tablet Take 1 tablet by mouth daily.    [provider]  doxazosin (CARDURA) 2 MG tablet Take 1 tablet (2 mg total) by mouth daily. 07/24/22   Richardson Dopp T, PA-C  losartan (COZAAR) 100 MG tablet Take 1 tablet (100 mg total) by mouth daily. 06/06/22   Richardson Dopp T, PA-C  metoprolol succinate (TOPROL XL) 25 MG 24 hr tablet Take 1 tablet (25 mg total) by mouth 2 (two) times daily. 08/07/22 09/06/22  Fransico Meadow, MD      Allergies    Avelox [moxifloxacin hcl in  nacl]    Review of Systems   Review of Systems  Physical Exam Updated Vital Signs BP 132/60   Pulse 65   Temp 97.7 F (36.5 C) (Oral)   Resp 19   Ht '5\' 1"'$  (1.549 m)   Wt 50.8 kg   SpO2 98%   BMI 21.16 kg/m  Physical Exam Vitals and nursing note reviewed.  Constitutional:      General: She is not in acute distress.    Appearance: She is well-developed.  HENT:     Head: Normocephalic and atraumatic.     Right Ear: External ear normal.     Left Ear: External ear normal.     Nose: Nose normal.  Eyes:     Extraocular Movements: Extraocular movements intact.     Conjunctiva/sclera: Conjunctivae normal.     Pupils: Pupils are equal, round, and reactive to light.  Cardiovascular:     Rate and Rhythm: Normal rate and regular rhythm.     Heart sounds: No murmur heard. Pulmonary:     Effort: Pulmonary effort is normal. No respiratory distress.     Breath sounds: Normal breath sounds.  Musculoskeletal:     Cervical back: Normal range of motion and neck supple.     Right lower leg: Edema (Trace) present.     Left lower leg: Edema (Trace) present.  Skin:    General:  Skin is warm and dry.  Neurological:     Mental Status: She is alert and oriented to person, place, and time. Mental status is at baseline.  Psychiatric:        Mood and Affect: Mood normal.     ED Results / Procedures / Treatments   Labs (all labs ordered are listed, but only abnormal results are displayed) Labs Reviewed  BASIC METABOLIC PANEL - Abnormal; Notable for the following components:      Result Value   Glucose, Bld 102 (*)    All other components within normal limits  TSH - Abnormal; Notable for the following components:   TSH 5.309 (*)    All other components within normal limits  CBC  MAGNESIUM  TROPONIN I (HIGH SENSITIVITY)    EKG EKG Interpretation  Date/Time:  Thursday August 07 2022 18:26:42 EDT Ventricular Rate:  152 PR Interval:  164 QRS Duration: 72 QT Interval:  286 QTC  Calculation: 454 R Axis:   62 Text Interpretation: Atrial fibrillation with RVR Marked ST abnormality, possible inferior subendocardial injury Abnormal ECG When compared with ECG of 20-Dec-2010 11:13, Vent. rate has increased BY  95 BPM Questionable change in QRS axis ST now depressed in Inferior leads ST now depressed in Lateral leads T wave inversion now evident in Inferior leads T wave inversion now evident in Anterolateral leads Confirmed by Margaretmary Eddy [phone]) on 08/07/2022 7:21:59 PM  Radiology DG Chest 2 View  Result Date: 08/07/2022 CLINICAL DATA:  Palpitations and tachycardia EXAM: CHEST - 2 VIEW COMPARISON:  12/20/2010 FINDINGS: Stable cardiomediastinal silhouette. Aortic atherosclerotic calcification. No focal consolidation, pleural effusion, or pneumothorax. No acute osseous abnormality. IMPRESSION: No active cardiopulmonary disease. Electronically Signed   By: Placido Sou M.D.   On: 08/07/2022 19:03    Procedures Procedures   Medications Ordered in ED Medications - No data to display  ED Course/ Medical Decision Making/ A&P Clinical Course as of 08/07/22 2025  Thu Aug 07, 2022  1950 Dr Marlou Porch recommends 25 mg BID of metoprolol. Recommends starting patient on eliquis at this time. In October will reduce dose when she turns 82.  [RP]    Clinical Course User Index [RP] Fransico Meadow, MD       CHA2DS2-VASc Score: 4                    Medical Decision Making Amount and/or Complexity of Data Reviewed Labs: ordered. Radiology: ordered.  Risk Prescription drug management.   Deborah Lowe is a 82 y.o. female with comorbidities that complicate the patient evaluation including palpitations on metoprolol and hypertension who presents to the emergency department with atrial fibrillation with RVR accompanied by chest discomfort, shortness of breath, dizziness  Initial Ddx:  Atrial fibrillation RVR, MI, electrolyte abnormality, thyroid abnormality  MDM:  Feel the  patient likely had A-fib with RVR causing her symptoms especially because with the atrial fibrillation with RVR resolved so did her symptoms.  Will send troponin to assess for MI given her chest discomfort but feel this is less likely.  Will also assess for electrolyte abnormality and possible hyperthyroidism.  Plan:  Labs Troponin TSH EKG Chest x-ray  ED Summary/Re-evaluation:  Patient reassessed and remained stable while in the emergency department.  Ambulated about the emergency department without return of her atrial fibrillation with RVR.  Discussed with cardiology who felt that she should be started on her metoprolol twice daily and Eliquis as well.  They will follow-up with her  in clinic.  Patient was noted to have elevated TSH so feel that hyperthyroidism less likely.  Will have her follow-up with her primary doctor regarding this finding.  This patient presents to the ED for concern of complaints listed in HPI, this involves an extensive number of treatment options, and is a complaint that carries with it a high risk of complications and morbidity. Disposition including potential need for admission considered.   Dispo: DC Home. Return precautions discussed including, but not limited to, those listed in the AVS. Allowed pt time to ask questions which were answered fully prior to dc.  Additional history obtained from spouse Records reviewed Outpatient Clinic Notes The following labs were independently interpreted: Chemistry and show no acute abnormality I independently reviewed the following imaging with scope of interpretation limited to determining acute life threatening conditions related to emergency care: Chest x-ray and agree with the radiologist interpretation with the following exceptions: None I personally reviewed and interpreted cardiac monitoring: normal sinus rhythm  and atrial fibrillation with RVR I personally reviewed and interpreted the pt's EKG: see above for  interpretation  I have reviewed the patients home medications and made adjustments as needed Consults: Cardiology Social Determinants of health:  Elderly  Final Clinical Impression(s) / ED Diagnoses Final diagnoses:  Atrial fibrillation with RVR ([place])    Rx / DC Orders ED Discharge Orders          Ordered    metoprolol succinate (TOPROL XL) 25 MG 24 hr tablet  2 times daily        08/07/22 2021    apixaban (ELIQUIS) 5 MG TABS tablet  2 times daily        08/07/22 2021    Amb referral to [facility]        08/07/22 2021           CRITICAL CARE Performed by: Fransico Meadow   Total critical care time: 30 minutes  Critical care time was exclusive of separately billable procedures and treating other patients.  Critical care was necessary to treat or prevent imminent or life-threatening deterioration.  Critical care was time spent personally by me on the following activities: development of treatment plan with patient and/or surrogate as well as nursing, discussions with consultants, evaluation of patient's response to treatment, examination of patient, obtaining history from patient or surrogate, ordering and performing treatments and interventions, ordering and review of laboratory studies, ordering and review of radiographic studies, pulse oximetry and re-evaluation of patient's condition.    Fransico Meadow, MD 08/07/22 2028

## 2022-08-07 NOTE — Telephone Encounter (Signed)
Called patient back to see how she was doing. Patient now c/o SOB, dizziness. She states she checked her BP again and it was 126/95, HR 132. She states she still feels palpitations and she is audibly SOB on the phone. Advised patient to go to ER, spoke to spouse with patient's permission, spouse verbalizes understanding to take patient to ER or to call EMS if patient seems like her breathing is getting worse.

## 2022-08-07 NOTE — Discharge Instructions (Addendum)
You were seen for your atrial fibrillation in the emergency department.  This is likely what was causing your palpitations.  At home, please take your metoprolol twice a day.  Please also take the Eliquis we prescribed you to prevent stroke.    Check your MyChart online for the results of any tests that had not resulted by the time you left the emergency department.   Follow-up with your cardiologist within the next week regarding your symptoms.  We have also placed a referral to the atrial fibrillation clinic so they may call you about an appointment.  Return immediately to the emergency department if you experience any of the following: Chest pain, shortness of breath, or any other concerning symptoms.    Thank you for visiting our Emergency Department. It was a pleasure taking care of you today.

## 2022-08-07 NOTE — Telephone Encounter (Signed)
Dr. Marlou Porch fielded a call from the ER on this patient and discussed recs with EDP -> new onset afib, converted in ED. He recommended ER discharge patient with rx for Eliquis '5mg'$  BID (will need attention to upcoming birthday in [birth year]23 when she will turn 82 for dosing requirement).  Dr. Marlou Porch requests early afib clinic follow-up - will route to afib clinic to help arrange. If no availbility in afib clinic, please reach out to [facility] team where pt is established. Thanks!

## 2022-08-07 NOTE — Telephone Encounter (Signed)
Patient calling in with concerns about elevated HR, BP and she is also experiencing palpitations. Patient states she was sitting down around 4 pm when she began to experience palpitations. She checked her HR and BP, HR 117 and BP 124/105. She took her vitals again on the same arm 5 mins later and got a HR of 121 and BP of 150/89. I explained to patient that taking BP on the same arm one after the other may results in a falsely elevated BP. I also reviewed with patient from her record in Epic that she experiences chronic palpitations. Patient states her HR is not usually this high and this is what is concerning her. She denies SOB, chest pain but does endorse swelling in her ankles that she states she has called in about recently (see telephone note 07/24/22).   I also reviewed meds with patient who states she took her amlodipine and losartan this morning. She states she doesn't usually take her metoprolol or cardura until nighttime. I noted on instructions for metoprolol that she is to take an extra dose of Toprol 25 mg for palpitations or bp over 140/90. I advised patient to take a dose of toprol 25 mg, patient agreed. I told patient I would call her in one hour to check on her symptoms. Patient states she will remain at home resting as she is very anxious at this time.

## 2022-08-07 NOTE — ED Triage Notes (Signed)
Patient here POV from Home.  Endorses being at Home a few hours ago when she felt Palpitations and Tachycardia of 120-130. BP was noted to be high prompting concern. Called PCP who recommended taking another dose of Metoprolol which she did. BP was noted to still be high as well as the HR. Also became SOB and Dizzy at that time.  No CP.  NAD Noted during Triage. A&Ox4. GCS 15. BIB Wheelchair.

## 2022-08-07 NOTE — Telephone Encounter (Signed)
Pt c/o BP issue: STAT if pt c/o blurred vision, one-sided weakness or slurred speech  1. What are your last 5 BP readings? 150/89   2. Are you having any other symptoms (ex. Dizziness, headache, blurred vision, passed out)? No, but pt can tell her heart is racing   3. What is your BP issue? Pt states her BP has been very elevated since 2 hours ago  STAT if HR is under 50 or over 120 (normal HR is 60-100 beats per minute)  What is your heart rate? 121  Do you have a log of your heart rate readings (document readings)? Was 117 at 3:55pm  Do you have any other symptoms? No, Pt just states she can feel her heart beating very fast.

## 2022-08-08 NOTE — Telephone Encounter (Signed)
Appt already in place with scott weaver 3/19.

## 2022-08-08 NOTE — Telephone Encounter (Signed)
See telephone note from 08/07/22 at 5:30 pm, patient sent to ED.

## 2022-08-12 ENCOUNTER — Ambulatory Visit: Payer: Medicare Other | Attending: Physician Assistant | Admitting: Physician Assistant

## 2022-08-12 ENCOUNTER — Encounter: Payer: Self-pay | Admitting: Physician Assistant

## 2022-08-12 VITALS — BP 178/76 | HR 59 | Ht 61.0 in | Wt 117.4 lb

## 2022-08-12 DIAGNOSIS — I480 Paroxysmal atrial fibrillation: Secondary | ICD-10-CM

## 2022-08-12 DIAGNOSIS — R072 Precordial pain: Secondary | ICD-10-CM | POA: Diagnosis not present

## 2022-08-12 DIAGNOSIS — R7989 Other specified abnormal findings of blood chemistry: Secondary | ICD-10-CM

## 2022-08-12 DIAGNOSIS — I10 Essential (primary) hypertension: Secondary | ICD-10-CM | POA: Diagnosis not present

## 2022-08-12 DIAGNOSIS — R9431 Abnormal electrocardiogram [ECG] [EKG]: Secondary | ICD-10-CM | POA: Diagnosis not present

## 2022-08-12 HISTORY — DX: Paroxysmal atrial fibrillation: I48.0

## 2022-08-12 MED ORDER — DOXAZOSIN MESYLATE 4 MG PO TABS: 4.0000 mg | ORAL_TABLET | Freq: Every day | ORAL | 3 refills | Status: DC

## 2022-08-12 MED ORDER — DOXAZOSIN MESYLATE 2 MG PO TABS: 2.0000 mg | ORAL_TABLET | Freq: Every day | ORAL | 1 refills | Status: DC

## 2022-08-12 MED ORDER — APIXABAN 5 MG PO TABS: 5.0000 mg | ORAL_TABLET | Freq: Two times a day (BID) | ORAL | 11 refills | Status: DC

## 2022-08-12 NOTE — Assessment & Plan Note (Signed)
I have asked her to follow-up with primary care to further evaluate this.

## 2022-08-12 NOTE — Assessment & Plan Note (Signed)
She does have an element of whitecoat hypertension.  She brings in a list of her blood pressures from home.  Her blood pressures at home are optimal.  She does have significant swelling in her legs from amlodipine.  This is quite bothersome for her.  I will stop her amlodipine.  I will increase her doxazosin from 1 mg to 2 mg daily.  Continue losartan 100 mg daily.  I have asked her to continue to monitor her blood pressures at home and notify me if they are increasing.

## 2022-08-12 NOTE — Assessment & Plan Note (Addendum)
As noted, she recently presented to the emergency room with atrial fibrillation with rapid ventricular rate.  She converted quickly to normal sinus rhythm.  She continues to have episodes of palpitations.  She also notes chest burning/heaviness with this.  She had inferior and anterolateral ST depression on her electrocardiogram while she was in rapid ventricular rate.  She had a troponin that was negative.  She seems to be very symptomatic with atrial fibrillation.  I suspect she will need an antiarrhythmic to control her symptoms.  I think flecainide would be a good choice for her.  However, given her symptoms and EKG changes, we need to rule out obstructive coronary artery disease.  As noted, we discussed the importance of anticoagulation to prevent stroke.  Of note, she will be 82 in October.  At that time, she will require the lower dose of Eliquis.  For now, she will take the higher dose.  Recent TSH was mildly elevated.  This will need follow-up with primary care. Start Eliquis 5 mg twice daily Continue metoprolol succinate 25 mg twice daily Obtain echocardiogram to rule out structural heart disease Follow-up 2 months

## 2022-08-12 NOTE — Assessment & Plan Note (Addendum)
She notes chest burning/heaviness at night when she notices palpitations.  As noted, she had EKG changes when her heart rate was in the 150s-160s.  Arrange coronary CTA with FFR to rule out obstructive coronary artery disease as well as to help guide antiarrhythmic drug management.  Obtain echocardiogram.

## 2022-08-12 NOTE — Patient Instructions (Addendum)
Medication Instructions:  Your physician has recommended you make the following change in your medication:   INCREASE Cardura to 2 mg taking  1 daily  YOU CAN TAKE 2 OF THE 1 MG TABLETS TO USE THEM UP  START Eliquis 5 mg taking 1 twice a day    *If you need a refill on your cardiac medications before your next appointment, please call your pharmacy*   Lab Work: None ordered  If you have labs (blood work) drawn today and your tests are completely normal, you will receive your results only by: [place] (if you have MyChart) OR A paper copy in the mail If you have any lab test that is abnormal or we need to change your treatment, we will call you to review the results.   Testing/Procedures: Your physician has requested that you have an echocardiogram. Echocardiography is a painless test that uses sound waves to create images of your heart. It provides your doctor with information about the size and shape of your heart and how well your heart's chambers and valves are working. This procedure takes approximately one hour. There are no restrictions for this procedure. Please do NOT wear cologne, perfume, aftershave, or lotions (deodorant is allowed). Please arrive 15 minutes prior to your appointment time.   Your physician has requested that you have cardiac CT. Cardiac computed tomography (CT) is a painless test that uses an x-ray machine to take clear, detailed pictures of your heart. For further information please visit [email or web address].tn. Please follow instruction sheet as BELOW:    Your cardiac CT will be scheduled at one of the below locations:   [facility] [address] [place], [place] [address] [phone]  Please arrive at the [facility] En[facility] (Entrance C2) of [facility] 30 minutes prior to test start time. You can use the FREE valet parking offered at entrance C (encouraged to control the heart rate for the test)  Proceed to  the [facility] Radiology Department (first floor) to check-in and test prep.  All radiology patients and guests should use entrance C2 at [facility], accessed from [facility], even though the hospital's physical address listed is [address].      Please follow these instructions carefully (unless otherwise directed):  On the Night Before the Test: Be sure to Drink plenty of water. Do not consume any caffeinated/decaffeinated beverages or chocolate 12 hours prior to your test. Do not take any antihistamines 12 hours prior to your test. If the patient has contrast allergy: Patient will need a prescription for Prednisone and very clear instructions (as follows): Prednisone 50 mg - take 13 hours prior to test Take another Prednisone 50 mg 7 hours prior to test Take another Prednisone 50 mg 1 hour prior to test Take Benadryl 50 mg 1 hour prior to test Patient must complete all four doses of above prophylactic medications. Patient will need a ride after test due to Benadryl.  On the Day of the Test: Drink plenty of water until 1 hour prior to the test. Do not eat any food 1 hour prior to test. You may take your regular medications prior to the test.  Take 1 metoprolol (Lopressor) two hours prior to test. FEMALES- please wear underwire-free bra if available, avoid dresses & tight clothing      After the Test: Drink plenty of water. After receiving IV contrast, you may experience a mild flushed feeling. This is normal. On occasion,  you may experience a mild rash up to 24 hours after the test. This is not dangerous. If this occurs, you can take Benadryl 25 mg and increase your fluid intake. If you experience trouble breathing, this can be serious. If it is severe call 911 IMMEDIATELY. If it is mild, please call our office.  We will call to schedule your test 2-4 weeks out understanding that some insurance companies will need an authorization prior to the  service being performed.   For non-scheduling related questions, please contact the cardiac imaging nurse navigator should you have any questions/concerns: Marchia Bond, Cardiac Imaging Nurse Navigator Gordy Clement, Cardiac Imaging Nurse Navigator [facility] Direct Office Dial: [phone]   For scheduling needs, including cancellations and rescheduling, please call [place], [phone].      Follow-Up: At [facility], you and your health needs are our priority.  As part of our continuing mission to provide you with exceptional heart care, we have created designated Provider Care Teams.  These Care Teams include your primary Cardiologist (physician) and Advanced Practice Providers (APPs -  Physician Assistants and Nurse Practitioners) who all work together to provide you with the care you need, when you need it.  We recommend signing up for the patient portal called "MyChart".  Sign up information is provided on this After Visit Summary.  MyChart is used to connect with patients for Virtual Visits (Telemedicine).  Patients are able to view lab/test results, encounter notes, upcoming appointments, etc.  Non-urgent messages can be sent to your provider as well.   To learn more about what you can do with MyChart, go to [email or web address].    Your next appointment:   2 month(s)  Provider:   Richardson Dopp, PA-C         Other Instructions If your blood pressure is over 140/90 several times in a row, after increasing the Cardura, call and let us know.   Ask your PCP to check your TSH

## 2022-08-12 NOTE — Progress Notes (Signed)
Cardiology Office Note:    Date:  08/12/2022  ID:  Deborah Lowe, Deborah Lowe 1940-10-31, MRN [id number] [facility] Prov[facility] Cardiologist:  Mertie Moores, MD Cardiology APP:  Liliane Shi, PA-C      Patient Profile:   Paroxysmal atrial fibrillation  Palpitations  Hypertension  Hyperlipidemia  Breast CA s/p lumpectomy (L breast)  Normal coronary arteries (cath 01/2007)      History of Present Illness:   Deborah Lowe is a 82 y.o. female who returns for follow-up on atrial fibrillation, hypertension.  She was last seen 05/13/22 for uncontrolled BP. I increased her Losartan/HCTZ to 100/25 mg once daily. She developed hyponatremia with this and I changed her to Losartan 100 mg once daily and added Amlodipine 5 mg once daily.  She then developed lower extremity edema with amlodipine.  The dose was reduced and she was placed on doxazosin.    She went to the emergency room 08/07/2022 with atrial fibrillation with RVR.  Her metoprolol succinate was increased to 25 mg twice daily and she was placed on apixaban.  Labs from her emergency room visit were reviewed.  Her potassium, creatinine, magnesium and hemoglobin were normal.  Troponin was normal at 12.  Chest x-ray showed no acute disease.  TSH was mildly elevated at 5.309. Electrocardiogram was personally reviewed and interpreted.  This demonstrated atrial fibrillation with HR 152, inferior and anterolateral ST depression, QTc 454.    She decided to hold off on starting Eliquis until she could come in today for follow-up.  We discussed the importance of anticoagulation for stroke prophylaxis.  She has significant risk factors for stroke.  She continues to have episodes of palpitations.  I suspect her palpitations over the years were likely paroxysms of atrial fibrillation.  She does note associated chest burning and heaviness with this.  She does have some indigestion symptoms as well during the day.  She denies exertional chest symptoms.  She  has not had significant shortness of breath, syncope.  She continues to note lower extremity edema since starting on amlodipine and stopping HCTZ.  Review of Systems  Gastrointestinal:  Negative for hematochezia and melena.  Genitourinary:  Negative for hematuria.   see HPI    Studies Reviewed:    EKG: Sinus bradycardia, HR 59, normal axis, low voltage, poor R wave progression, QTc 397  Risk Assessment/Calculations:    CHA2DS2-VASc Score = 4   This indicates a 4.8% annual risk of stroke. The patient's score is based upon: CHF History: 0 HTN History: 1 Diabetes History: 0 Stroke History: 0 Vascular Disease History: 0 Age Score: 2 Gender Score: 1    HYPERTENSION CONTROL Vitals:   08/12/22 1504 08/12/22 1741  BP: (!) 184/76 (!) 178/76    The patient's blood pressure is elevated above target today.  In order to address the patient's elevated BP: A current anti-hypertensive medication was adjusted today.; The blood pressure is usually elevated in clinic.  Blood pressures monitored at home have been optimal.          Physical Exam:   VS:  BP (!) 178/76   Pulse (!) 59   Ht 5\' 1"  (1.549 m)   Wt 117 lb 6.4 oz (53.3 kg)   SpO2 98%   BMI 22.18 kg/m    Wt Readings from Last 3 Encounters:  08/12/22 117 lb 6.4 oz (53.3 kg)  08/07/22 111 lb 15.9 oz (50.8 kg)  07/30/22 112 lb (50.8 kg)    Constitutional:  Appearance: Healthy appearance. Not in distress.  Neck:     Vascular: JVD normal.  Pulmonary:     Breath sounds: Normal breath sounds. No wheezing. No rales.  Cardiovascular:     Normal rate. Regular rhythm. Normal S1. Normal S2.      Murmurs: There is no murmur.  Edema:    Peripheral edema present.    Ankle: bilateral trace edema of the ankle. Abdominal:     Palpations: Abdomen is soft.       ASSESSMENT AND PLAN:   Paroxysmal atrial fibrillation (HCC) As noted, she recently presented to the emergency room with atrial fibrillation with rapid ventricular rate.   She converted quickly to normal sinus rhythm.  She continues to have episodes of palpitations.  She also notes chest burning/heaviness with this.  She had inferior and anterolateral ST depression on her electrocardiogram while she was in rapid ventricular rate.  She had a troponin that was negative.  She seems to be very symptomatic with atrial fibrillation.  I suspect she will need an antiarrhythmic to control her symptoms.  I think flecainide would be a good choice for her.  However, given her symptoms and EKG changes, we need to rule out obstructive coronary artery disease.  As noted, we discussed the importance of anticoagulation to prevent stroke.  Of note, she will be 82 in October.  At that time, she will require the lower dose of Eliquis.  For now, she will take the higher dose.  Recent TSH was mildly elevated.  This will need follow-up with primary care. Start Eliquis 5 mg twice daily Continue metoprolol succinate 25 mg twice daily Obtain echocardiogram to rule out structural heart disease Follow-up 2 months  Precordial chest pain She notes chest burning/heaviness at night when she notices palpitations.  As noted, she had EKG changes when her heart rate was in the 150s-160s.  Arrange coronary CTA with FFR to rule out obstructive coronary artery disease as well as to help guide antiarrhythmic drug management.  Obtain echocardiogram.  Essential hypertension She does have an element of whitecoat hypertension.  She brings in a list of her blood pressures from home.  Her blood pressures at home are optimal.  She does have significant swelling in her legs from amlodipine.  This is quite bothersome for her.  I will stop her amlodipine.  I will increase her doxazosin from 1 mg to 2 mg daily.  Continue losartan 100 mg daily.  I have asked her to continue to monitor her blood pressures at home and notify me if they are increasing.  Elevated TSH I have asked her to follow-up with primary care to further  evaluate this.         Dispo:  Return in about 2 months (around 10/12/2022) for Routine Follow Up, w/ Richardson Dopp, PA-C.  Signed, Richardson Dopp, PA-C

## 2022-08-14 ENCOUNTER — Telehealth: Payer: Self-pay | Admitting: *Deleted

## 2022-08-14 NOTE — Telephone Encounter (Signed)
     Patient  visit on 08/07/2022  at [facility]  was for treatment   Have you been able to follow up with your primary care physician? yes  The patient was or was not able to obtain any needed medicine or equipment.  Are there diet recommendations that you are having difficulty following?  Patient expresses understanding of discharge instructions and education provided has no other needs at this time.    [place] [phone] [address] , [place] [address] Email : [email or web address]

## 2022-08-18 ENCOUNTER — Telehealth (HOSPITAL_COMMUNITY): Payer: Self-pay | Admitting: *Deleted

## 2022-08-18 NOTE — Telephone Encounter (Signed)
Reaching out to patient to offer assistance regarding upcoming cardiac imaging study; pt verbalizes understanding of appt date/time, parking situation and where to check in, pre-test NPO status, and verified current allergies; name and call back number provided for further questions should they arise  Gordy Clement RN Navigator Cardiac Imaging [facility] [phone] office [phone] cell  Patient to take her daily medications. She is aware to arrive at 3pm.

## 2022-08-19 ENCOUNTER — Ambulatory Visit (HOSPITAL_COMMUNITY)
Admission: RE | Admit: 2022-08-19 | Discharge: 2022-08-19 | Disposition: A | Discharge status: Home / Self Care | Payer: Medicare Other | Source: Ambulatory Visit | Attending: Physician Assistant | Admitting: Physician Assistant

## 2022-08-19 DIAGNOSIS — I10 Essential (primary) hypertension: Secondary | ICD-10-CM | POA: Diagnosis not present

## 2022-08-19 DIAGNOSIS — R072 Precordial pain: Secondary | ICD-10-CM | POA: Insufficient documentation

## 2022-08-19 DIAGNOSIS — R9431 Abnormal electrocardiogram [ECG] [EKG]: Secondary | ICD-10-CM | POA: Diagnosis not present

## 2022-08-19 DIAGNOSIS — I480 Paroxysmal atrial fibrillation: Secondary | ICD-10-CM | POA: Insufficient documentation

## 2022-08-19 DIAGNOSIS — I700 Atherosclerosis of aorta: Secondary | ICD-10-CM

## 2022-08-19 MED ORDER — IOHEXOL 350 MG/ML SOLN
100.0000 mL | Freq: Once | INTRAVENOUS | Status: AC | PRN
  Administered 2022-08-19: 100 mL via INTRAVENOUS

## 2022-08-19 MED ORDER — METOPROLOL TARTRATE 5 MG/5ML IV SOLN
INTRAVENOUS | Status: AC
  Filled 2022-08-19: qty 10

## 2022-08-19 MED ORDER — NITROGLYCERIN 0.4 MG SL SUBL
0.8000 mg | SUBLINGUAL_TABLET | Freq: Once | SUBLINGUAL | Status: AC
  Administered 2022-08-19: 0.8 mg via SUBLINGUAL

## 2022-08-19 MED ORDER — NITROGLYCERIN 0.4 MG SL SUBL
SUBLINGUAL_TABLET | SUBLINGUAL | Status: AC
  Filled 2022-08-19: qty 2

## 2022-09-05 ENCOUNTER — Encounter (HOSPITAL_BASED_OUTPATIENT_CLINIC_OR_DEPARTMENT_OTHER): Payer: Self-pay

## 2022-09-05 ENCOUNTER — Other Ambulatory Visit: Payer: Self-pay

## 2022-09-05 ENCOUNTER — Emergency Department (HOSPITAL_BASED_OUTPATIENT_CLINIC_OR_DEPARTMENT_OTHER)
Admission: EM | Admit: 2022-09-05 | Discharge: 2022-09-05 | Disposition: A | Discharge status: Home / Self Care | Payer: Medicare Other | Attending: Emergency Medicine | Admitting: Emergency Medicine

## 2022-09-05 DIAGNOSIS — R051 Acute cough: Secondary | ICD-10-CM | POA: Insufficient documentation

## 2022-09-05 DIAGNOSIS — Z853 Personal history of malignant neoplasm of breast: Secondary | ICD-10-CM | POA: Insufficient documentation

## 2022-09-05 DIAGNOSIS — I10 Essential (primary) hypertension: Secondary | ICD-10-CM | POA: Diagnosis not present

## 2022-09-05 DIAGNOSIS — Z7901 Long term (current) use of anticoagulants: Secondary | ICD-10-CM | POA: Insufficient documentation

## 2022-09-05 DIAGNOSIS — Z79899 Other long term (current) drug therapy: Secondary | ICD-10-CM | POA: Insufficient documentation

## 2022-09-05 DIAGNOSIS — I480 Paroxysmal atrial fibrillation: Secondary | ICD-10-CM | POA: Diagnosis not present

## 2022-09-05 LAB — CBC
HCT: 37.8 % (ref 36.0–46.0)
Hemoglobin: 12.6 g/dL (ref 12.0–15.0)
MCH: 29.6 pg (ref 26.0–34.0)
MCHC: 33.3 g/dL (ref 30.0–36.0)
MCV: 88.9 fL (ref 80.0–100.0)
Platelets: 201 K/uL (ref 150–400)
RBC: 4.25 MIL/uL (ref 3.87–5.11)
RDW: 12.9 % (ref 11.5–15.5)
WBC: 5 K/uL (ref 4.0–10.5)
nRBC: 0 % (ref 0.0–0.2)

## 2022-09-05 LAB — BASIC METABOLIC PANEL
Anion gap: 9 (ref 5–15)
BUN: 9 mg/dL (ref 8–23)
CO2: 24 mmol/L (ref 22–32)
Calcium: 9.4 mg/dL (ref 8.9–10.3)
Chloride: 105 mmol/L (ref 98–111)
Creatinine, Ser: 0.68 mg/dL (ref 0.44–1.00)
GFR, Estimated: >60 mL/min (ref >60)
Glucose, Bld: 98 mg/dL (ref 70–99)
Potassium: 3.5 mmol/L (ref 3.5–5.1)
Sodium: 138 mmol/L (ref 135–145)

## 2022-09-05 NOTE — ED Provider Notes (Signed)
[place] EMERGENCY DEPARTMENT AT [facility] Provider Note   CSN: [id number] Arrival date & time: 09/05/22  1834     History  Chief Complaint  Patient presents with   Hypertension    Deborah Lowe is a 82 y.o. female with h/o breast cancer in remission, HTN, paroxysmal afib, who presents with HTN, cough.   Endorses Cough that began 3 Days ago. Dry Cough. No fever/chills, . Went to UC and was sent for Evaluation due to elevated BP. BP was consistently elevated (218/82). She and her husband state that she has been working with her cardiologist for the last several months tapering her blood pressure and diuretics medications in order to strike a balance between her lower extremity edema and blood pressure.  Her last appointment per chart review in March 2024 demonstrated that her cardiologist was satisfied with her blood pressure readings at home.  Patient feels well at this time and just wants something for her cough.  She has no other associated symptoms including chest pain, shortness of breath, headache, visual changes, focal neurodeficits, urinary symptoms.  She has mild lower extremity IMA bilaterally that is decreased from prior.   Hypertension       Home Medications Prior to Admission medications   Medication Sig Start Date End Date Taking? Authorizing Provider  apixaban (ELIQUIS) 5 MG TABS tablet Take 1 tablet (5 mg total) by mouth 2 (two) times daily. 08/12/22   Tereso Newcomer T, PA-C  calcium-vitamin D (OSCAL WITH D) 250-125 MG-UNIT tablet Take 1 tablet by mouth daily.    [provider]  doxazosin (CARDURA) 2 MG tablet Take 1 tablet (2 mg total) by mouth daily. 08/12/22   Tereso Newcomer T, PA-C  losartan (COZAAR) 100 MG tablet Take 1 tablet (100 mg total) by mouth daily. 06/06/22   Tereso Newcomer T, PA-C  metoprolol succinate (TOPROL XL) 25 MG 24 hr tablet Take 1 tablet (25 mg total) by mouth 2 (two) times daily. 08/07/22 09/06/22  Rondel Baton, MD       Allergies    Avelox [moxifloxacin hcl in nacl]    Review of Systems   Review of Systems Review of systems Negative for f/c.  A 10 point review of systems was performed and is negative unless otherwise reported in HPI.  Physical Exam Updated Vital Signs BP (!) 171/57   Pulse (!) 49   Temp 98 F (36.7 C) (Oral)   Resp (!) 21   Ht 5\' 2"  (1.575 m)   Wt 51.7 kg   SpO2 94%   BMI 20.85 kg/m  Physical Exam General: Normal appearing female, lying in bed.  HEENT: PERRLA, Sclera anicteric, MMM, trachea midline.  Cardiology: RRR, no murmurs/rubs/gallops. BL radial and DP pulses equal bilaterally.  Resp: Normal respiratory rate and effort. CTAB, no wheezes, rhonchi, crackles.  Abd: Soft, non-tender, non-distended. No rebound tenderness or guarding.  GU: Deferred. MSK: No peripheral edema or signs of trauma. Extremities without deformity or TTP. No cyanosis or clubbing. Skin: warm, dry. Neuro: A&Ox4, CNs II-XII grossly intact. MAEs. Sensation grossly intact.  Psych: Normal mood and affect.   ED Results / Procedures / Treatments   Labs (all labs ordered are listed, but only abnormal results are displayed) Labs Reviewed  CBC  BASIC METABOLIC PANEL    EKG None  Radiology No results found.  Procedures Procedures    Medications Ordered in ED Medications - No data to display  ED Course/ Medical Decision Making/ A&P  Medical Decision Making Amount and/or Complexity of Data Reviewed Labs: ordered.    This patient presents to the ED for concern of cough, HTN; this involves an extensive number of treatment options, and is a complaint that carries with it a high risk of complications and morbidity.  I considered the following differential and admission for this acute, potentially life threatening condition. However, patient is overall very well-appearing and hDS  MDM:    Patient is overall well appearing at this time. Blood pressure is 171/57 mmHg.    Patient does not have any symptoms on history nor signs on physical exam concerning for end organ damage secondary to hypertension.   Specifically, based up the patient's presentation, the patient is at sufficiently low risk for: -ACS given no CP, no SOB, normal cardio-pulmonary exam -SAH/stroke given no hx of acute headache/stroke like sxs and normal neurologic exam.  -end organ renal disease given no hematuria  Labs demonstrate normal BMP. EKG w/ no signs of ischemia. Patient is given nothing for their blood pressure, which decreased into 170s from 210s without intervention.  For patient's cough, she denies any shortness of breath or chest pain to indicate pneumonia or PE and she has had no productive cough or fever/chills.  I also discussed with her the possibility of doing a chest x-ray to rule out pneumonia however patient states that she just wants some treatment for cough and did not know what she can take with her blood pressure.  Discussed with her that if her symptoms get worse or she develops a fever or shortness of breath she will need to have a chest x-ray and evaluated for the cough.  I recommended Mucinex over-the-counter and tea with honey  The patient was counseled regarding the deleterious effects of hypertension and the necessity of follow up with the patient's cardiologist. They have already been taking her blood pressure daily, and her cardiologist has been working diligently to adjust her antihypertensives.  I instructed them to call the cardiologist on Monday morning in order to schedule an appointment for next week for follow-up.  She is given DC instructions/return precautions related to HTN emergency.  Instructed to f/u with PCP within 1 week. Will be discharged with discharge instructions/return precautions.      Labs: I Ordered, and personally interpreted labs.  The pertinent results include:  those listed above  Additional history obtained from chart review, husband  at bedside.    Cardiac Monitoring: The patient was maintained on a cardiac monitor.  I personally viewed and interpreted the cardiac monitored which showed an underlying rhythm of: NSR  Social Determinants of Health: Patient lives independently   Disposition:  DC w/ discharge instructions/return precautions. All questions answered to patient's satisfaction.    Co morbidities that complicate the patient evaluation  Past Medical History:  Diagnosis Date   Breast cancer 2012   s/p lumpectomy of left breast with XRT   Hypertension    Palpitations    on beta blocker therapy   Ringing in ears    Wears glasses      Medicines No orders of the defined types were placed in this encounter.   I have reviewed the patients home medicines and have made adjustments as needed  Problem List / ED Course: Problem List Items Addressed This Visit   None Visit Diagnoses     Asymptomatic hypertension    -  Primary   Acute cough  This note was created using dictation software, which may contain spelling or grammatical errors.    Loetta Rough, MD 09/05/22 2206

## 2022-09-05 NOTE — Discharge Instructions (Signed)
Thank you for coming to [facility] Emergency Department. You were seen for high blood pressure and cough. We did an exam, labs, and imaging, and these showed no acute findings.   Please continue to take your blood pressure once daily in the afternoon after sitting quietly for 15 minutes and continue to follow-up with your cardiologist.  Please call your cardiologist on Monday morning to schedule an appointment for next week.  They may make additional adjustments to your blood pressure medications.  If you have high blood pressure greater than 180 mmHg associated with chest pain, shortness of breath, headache, visual changes, or strokelike symptoms please return to the emergency department.  For your cough, you can take over-the-counter Mucinex, cough drops, or tea with honey.  We did not do a chest x-ray which you declined.  It is possible you have a viral cough.  If your symptoms do not improve or get better within 1 week please follow-up with your primary care physician.  Do not hesitate to return to the ED or call 911 if you experience: -Worsening symptoms -Chest pain -Shortness of breath -Headache with high blood pressure -Visual changes like blurry vision or double vision -Numbness tingling or asymmetric weakness -Confusion or slurred speech -Facial droop -Lightheadedness, passing out -Fevers/chills -Anything else that concerns you

## 2022-09-05 NOTE — ED Triage Notes (Signed)
Patient here POV from Home.  Endorses Cough that began 3 Days ago. Dry Cough. No Fever. Went to UC and was sent for Evaluation due to elevated BP. BP was consistently elevated (218/82).   No Symptoms other than the Cough.   NAD Noted during Triage. A&Ox4. GCS 15. Ambulatory.

## 2022-09-08 ENCOUNTER — Telehealth: Payer: Self-pay | Admitting: Cardiovascular Disease

## 2022-09-08 NOTE — Telephone Encounter (Signed)
Spoke to the patient, explained APP recommendations: She really should see primary care. She did not have a CXR. It sounds like she should have had one.  She lives in [place]. Can we see if someone at [facility] can see her? They are a [facility] facility. I think they may have the ability to get a CXR there.  She should not take Mucinex D. It has a decongestant that will make her BP higher. She can use plain Mucinex or Robitussin. She needs to avoid any medication with D on the end as it will likely have a decongestant. DM is ok - this is just a cough suppressant. Coricidin is a good medication for patients with hypertension who have cold symptoms.  Patient stated she did call [facility] this morning and was advised the next available appointment is in August. Advised the patient to ask if she can be seen by APP and hopefully she will have an OV sooner than August. Patient voiced understanding.

## 2022-09-08 NOTE — Telephone Encounter (Signed)
Patient stated she was seen at [facility] urgent care on 4/12 and was advised to go to the ED due to hypertension. Patient stated while at the ED her symptoms were flu like, however, she did test negative for flu.  She did not receive any treatment for hypertension, was advised to f/u with cardiology. Patient stated she currently has stuffy nose, cough, and congestion. Advised her to contact PCP pertaining to those symptoms, she replied " I don't have a PCP and wanted to know if Lorin Picket can treat my current symptoms". Patient stated she does not feel good and will like to know if APP will call her in a medication. Educated patient on the importance of establishing care with APP, patient voiced understanding. Patient stated she is currently taking Mucinex-D and she thinks this is causing her to have diarrhea. Pt stated this is the reason my "blood pressure is low, because I can't keep anything down".  Will forward to APP for advise.  Blood pressure   4/14 108/47 4/13 143/60 HR 64

## 2022-09-08 NOTE — Telephone Encounter (Signed)
She really should see primary care. She did not have a CXR. It sounds like she should have had one.  She lives in [place]. Can we see if someone at [facility] can see her? They are a [facility] facility. I think they may have the ability to get a CXR there.  She should not take Mucinex D. It has a decongestant that will make her BP higher. She can use plain Mucinex or Robitussin. She needs to avoid any medication with D on the end as it will likely have a decongestant. DM is ok - this is just a cough suppressant. Coricidin is a good medication for patients with hypertension who have cold symptoms. Tereso Newcomer, PA-C    09/08/2022 1:43 PM

## 2022-09-08 NOTE — Telephone Encounter (Signed)
Pt c/o BP issue: STAT if pt c/o blurred vision, one-sided weakness or slurred speech  1. What are your last 5 BP readings? Unsure   2. Are you having any other symptoms (ex. Dizziness, headache, blurred vision, passed out)? Weakness  3. What is your BP issue?   Patient states that she is unsure of her currently BP, but stated that she went to an urgent care and ED 04/12 for flu like symptoms, but they would not treat her due to high BP.  She states that she was told at hospital to take mucinex and was told to call her cardiologist today. She states she feels very weak and would like to know what she should do next. She states that mucinex seems to not be working and seems to be upsetting her stomach. Requesting return call.

## 2022-09-09 ENCOUNTER — Encounter: Payer: Self-pay | Admitting: Family

## 2022-09-09 ENCOUNTER — Ambulatory Visit (INDEPENDENT_AMBULATORY_CARE_PROVIDER_SITE_OTHER): Payer: Medicare Other

## 2022-09-09 ENCOUNTER — Ambulatory Visit (INDEPENDENT_AMBULATORY_CARE_PROVIDER_SITE_OTHER): Payer: Medicare Other | Admitting: Family

## 2022-09-09 VITALS — BP 150/61 | HR 69 | Temp 97.3°F | Ht 62.0 in | Wt 116.0 lb

## 2022-09-09 DIAGNOSIS — F419 Anxiety disorder, unspecified: Secondary | ICD-10-CM | POA: Diagnosis not present

## 2022-09-09 DIAGNOSIS — R051 Acute cough: Secondary | ICD-10-CM | POA: Diagnosis not present

## 2022-09-09 DIAGNOSIS — B9689 Other specified bacterial agents as the cause of diseases classified elsewhere: Secondary | ICD-10-CM | POA: Diagnosis not present

## 2022-09-09 DIAGNOSIS — Z1159 Encounter for screening for other viral diseases: Secondary | ICD-10-CM | POA: Diagnosis not present

## 2022-09-09 DIAGNOSIS — Z853 Personal history of malignant neoplasm of breast: Secondary | ICD-10-CM

## 2022-09-09 DIAGNOSIS — I10 Essential (primary) hypertension: Secondary | ICD-10-CM | POA: Diagnosis not present

## 2022-09-09 DIAGNOSIS — I480 Paroxysmal atrial fibrillation: Secondary | ICD-10-CM | POA: Diagnosis not present

## 2022-09-09 DIAGNOSIS — R059 Cough, unspecified: Secondary | ICD-10-CM | POA: Diagnosis not present

## 2022-09-09 DIAGNOSIS — J208 Acute bronchitis due to other specified organisms: Secondary | ICD-10-CM

## 2022-09-09 DIAGNOSIS — E7800 Pure hypercholesterolemia, unspecified: Secondary | ICD-10-CM

## 2022-09-09 DIAGNOSIS — J9811 Atelectasis: Secondary | ICD-10-CM | POA: Diagnosis not present

## 2022-09-09 DIAGNOSIS — J90 Pleural effusion, not elsewhere classified: Secondary | ICD-10-CM | POA: Diagnosis not present

## 2022-09-09 MED ORDER — ESCITALOPRAM OXALATE 5 MG PO TABS: 5.0000 mg | ORAL_TABLET | Freq: Every day | ORAL | 0 refills | Status: DC

## 2022-09-09 MED ORDER — BENZONATATE 200 MG PO CAPS: 200.0000 mg | ORAL_CAPSULE | Freq: Three times a day (TID) | ORAL | 1 refills | Status: DC | PRN

## 2022-09-09 MED ORDER — DOXYCYCLINE HYCLATE 100 MG PO TABS: 100.0000 mg | ORAL_TABLET | Freq: Two times a day (BID) | ORAL | 0 refills | Status: DC

## 2022-09-09 NOTE — Telephone Encounter (Signed)
Call placed to [facility], spoke with Toniann Fail.  Jannifer Rodney, FNP, is going to work pt in today.  Pt has been called and made aware to make her way on now so she can be seen.

## 2022-09-09 NOTE — Patient Instructions (Signed)

## 2022-09-09 NOTE — Telephone Encounter (Signed)
Patient states she will need an order placed to get X-ray as requested by Wende Mott.

## 2022-09-09 NOTE — Telephone Encounter (Signed)
Can you call [facility] and see if they can work her in as an acute visit? Tereso Newcomer, PA-C    09/09/2022 8:58 AM

## 2022-09-09 NOTE — Progress Notes (Signed)
Subjective:    Patient ID: Deborah Lowe, female    DOB: 1940/05/27, 82 y.o.   MRN: [id number]  Chief Complaint  Patient presents with   Cough    Cardiology wants chest xray.   Nasal Congestion   PT presents to the office today to establish care. She was seen at Cardiologists every 6 months for A FIb and HTN.   She is followed by GYN annually.   Has hx of left breast cancer and gets annual mammograms.  Cough This is a new problem. The current episode started in the past 7 days. The problem has been waxing and waning. The problem occurs every few minutes. The cough is Non-productive. Associated symptoms include nasal congestion. Pertinent negatives include no ear congestion, ear pain, shortness of breath, weight loss or wheezing. She has tried rest for the symptoms. The treatment provided mild relief.  Hypertension This is a chronic problem. The current episode started more than 1 year ago. The problem has been waxing and waning since onset. The problem is uncontrolled. Associated symptoms include anxiety. Pertinent negatives include no malaise/fatigue, peripheral edema or shortness of breath. Risk factors for coronary artery disease include dyslipidemia and sedentary lifestyle. The current treatment provides moderate improvement.  Anxiety Presents for follow-up visit. Symptoms include depressed mood, excessive worry, nervous/anxious behavior and restlessness. Patient reports no shortness of breath. Symptoms occur occasionally. The severity of symptoms is mild.        Review of Systems  Constitutional:  Negative for malaise/fatigue and weight loss.  HENT:  Negative for ear pain.   Respiratory:  Positive for cough. Negative for shortness of breath and wheezing.   Psychiatric/Behavioral:  The patient is nervous/anxious.   All other systems reviewed and are negative.  Family History  Problem Relation Age of Onset   Colon cancer Mother    Heart disease Father    Bone cancer  Brother    Social History   Socioeconomic History   Marital status: Married    Spouse name: [name]   Number of children: 1   Years of education: 12   Highest education level: Not on file  Occupational History   Occupation: retired  Tobacco Use   Smoking status: Never   Smokeless tobacco: Never  Vaping Use   Vaping Use: Never used  Substance and Sexual Activity   Alcohol use: No   Drug use: No   Sexual activity: Yes    Birth control/protection: Post-menopausal  Other Topics Concern   Not on file  Social History Narrative   One daughter   Social Determinants of Health   Financial Resource Strain: Low Risk  (07/30/2022)   Overall Financial Resource Strain (CARDIA)    Difficulty of Paying Living Expenses: Not hard at all  Food Insecurity: No Food Insecurity (07/30/2022)   Hunger Vital Sign    Worried About Running Out of Food in the Last Year: Never true    Ran Out of Food in the Last Year: Never true  Transportation Needs: No Transportation Needs (07/30/2022)   PRAPARE - Administrator, Civil Service (Medical): No    Lack of Transportation (Non-Medical): No  Physical Activity: Insufficiently Active (07/30/2022)   Exercise Vital Sign    Days of Exercise per Week: 3 days    Minutes of Exercise per Session: 30 min  Stress: No Stress Concern Present (07/30/2022)   [name] of Occupational Health - Occupational Stress Questionnaire    Feeling of Stress : Not at  all  Social Connections: Moderately Isolated (07/30/2022)   Social Connection and Isolation Panel [NHANES]    Frequency of Communication with Friends and Family: More than three times a week    Frequency of Social Gatherings with Friends and Family: More than three times a week    Attends Religious Services: Never    Database administrator or Organizations: No    Attends Banker Meetings: Never    Marital Status: Married       Objective:   Physical Exam Vitals reviewed.  Constitutional:       General: She is not in acute distress.    Appearance: She is well-developed.  HENT:     Head: Normocephalic and atraumatic.     Right Ear: Tympanic membrane normal.     Left Ear: Tympanic membrane normal.  Eyes:     Pupils: Pupils are equal, round, and reactive to light.  Neck:     Thyroid: No thyromegaly.  Cardiovascular:     Rate and Rhythm: Normal rate and regular rhythm.     Heart sounds: Normal heart sounds. No murmur heard. Pulmonary:     Effort: Pulmonary effort is normal. No respiratory distress.     Breath sounds: Rhonchi present. No wheezing.  Abdominal:     General: Bowel sounds are normal. There is no distension.     Palpations: Abdomen is soft.     Tenderness: There is no abdominal tenderness.  Musculoskeletal:        General: No tenderness. Normal range of motion.     Cervical back: Normal range of motion and neck supple.  Skin:    General: Skin is warm and dry.  Neurological:     Mental Status: She is alert and oriented to person, place, and time.     Cranial Nerves: No cranial nerve deficit.     Deep Tendon Reflexes: Reflexes are normal and symmetric.  Psychiatric:        Behavior: Behavior normal.        Thought Content: Thought content normal.        Judgment: Judgment normal.      BP (!) 150/61   Pulse 69   Temp (!) 97.3 F (36.3 C) (Temporal)   Ht  (1.575 m)   Wt 116 lb (52.6 kg)   BMI 21.22 kg/m       Assessment & Plan:  KORINA TRETTER comes in today with chief complaint of Cough (Cardiology wants chest xray.) and Nasal Congestion   Diagnosis and orders addressed:  1. Essential hypertension - CMP14+EGFR - CBC with Differential/Platelet  2. History of left breast cancer - CMP14+EGFR - CBC with Differential/Platelet  3. Paroxysmal atrial fibrillation - CMP14+EGFR - CBC with Differential/Platelet  4. Pure hypercholesterolemia - CMP14+EGFR - CBC with Differential/Platelet  5. Anxiety Start Lexapro 5 mg Stress  management  - escitalopram (LEXAPRO) 5 MG tablet; Take 1 tablet (5 mg total) by mouth daily.  Dispense: 90 tablet; Refill: 0 - CMP14+EGFR - CBC with Differential/Platelet  6. Acute cough - CMP14+EGFR - CBC with Differential/Platelet  7. Acute bacterial bronchitis - Take meds as prescribed - Use a cool mist humidifier  -Use saline nose sprays frequently -Force fluids -For any cough or congestion  Use plain Mucinex- regular strength or max strength is fine -For fever or aces or pains- take tylenol or ibuprofen. -Throat lozenges if help -Follow up if symptoms worsen or do not improve - CMP14+EGFR - CBC with Differential/Platelet - DG  Chest 2 View - doxycycline (VIBRA-TABS) 100 MG tablet; Take 1 tablet (100 mg total) by mouth 2 (two) times daily.  Dispense: 20 tablet; Refill: 0 - benzonatate (TESSALON) 200 MG capsule; Take 1 capsule (200 mg total) by mouth 3 (three) times daily as needed.  Dispense: 30 capsule; Refill: 1  8. Need for hepatitis C screening test - CMP14+EGFR - CBC with Differential/Platelet - Hepatitis C antibody   Labs pending Health Maintenance reviewed Diet and exercise encouraged  Follow up plan: 1 month    Jannifer Rodney, FNP

## 2022-09-10 ENCOUNTER — Ambulatory Visit (HOSPITAL_COMMUNITY): Payer: Medicare Other | Attending: Physician Assistant

## 2022-09-10 ENCOUNTER — Encounter: Payer: Self-pay | Admitting: Physician Assistant

## 2022-09-10 DIAGNOSIS — I480 Paroxysmal atrial fibrillation: Secondary | ICD-10-CM | POA: Diagnosis not present

## 2022-09-10 DIAGNOSIS — I10 Essential (primary) hypertension: Secondary | ICD-10-CM | POA: Diagnosis not present

## 2022-09-10 DIAGNOSIS — R072 Precordial pain: Secondary | ICD-10-CM | POA: Insufficient documentation

## 2022-09-10 LAB — CBC WITH DIFFERENTIAL/PLATELET
Basophils Absolute: 0 10*3/uL (ref 0.0–0.2)
Basos: 1 %
EOS (ABSOLUTE): 0.1 10*3/uL (ref 0.0–0.4)
Eos: 2 %
Hematocrit: 39.9 % (ref 34.0–46.6)
Hemoglobin: 13 g/dL (ref 11.1–15.9)
Immature Grans (Abs): 0 10*3/uL (ref 0.0–0.1)
Immature Granulocytes: 0 %
Lymphocytes Absolute: 1.2 10*3/uL (ref 0.7–3.1)
Lymphs: 35 %
MCH: 28.8 pg (ref 26.6–33.0)
MCHC: 32.6 g/dL (ref 31.5–35.7)
MCV: 88 fL (ref 79–97)
Monocytes Absolute: 0.4 10*3/uL (ref 0.1–0.9)
Monocytes: 13 %
Neutrophils Absolute: 1.7 10*3/uL (ref 1.4–7.0)
Neutrophils: 49 %
Platelets: 169 10*3/uL (ref 150–450)
RBC: 4.52 x10E6/uL (ref 3.77–5.28)
RDW: 12.4 % (ref 11.7–15.4)
WBC: 3.4 10*3/uL (ref 3.4–10.8)

## 2022-09-10 LAB — CMP14+EGFR
ALT: 18 IU/L (ref 0–32)
AST: 16 IU/L (ref 0–40)
Albumin/Globulin Ratio: 1.8 (ref 1.2–2.2)
Albumin: 3.9 g/dL (ref 3.8–4.8)
Alkaline Phosphatase: 81 IU/L (ref 44–121)
BUN/Creatinine Ratio: 13 (ref 12–28)
BUN: 8 mg/dL (ref 8–27)
Bilirubin Total: 0.6 mg/dL (ref 0.0–1.2)
CO2: 22 mmol/L (ref 20–29)
Calcium: 8.6 mg/dL — ABNORMAL LOW (ref 8.7–10.3)
Chloride: 97 mmol/L (ref 96–106)
Creatinine, Ser: 0.6 mg/dL (ref 0.57–1.00)
Globulin, Total: 2.2 g/dL (ref 1.5–4.5)
Glucose: 128 mg/dL — ABNORMAL HIGH (ref 70–99)
Potassium: 3.5 mmol/L (ref 3.5–5.2)
Sodium: 134 mmol/L (ref 134–144)
Total Protein: 6.1 g/dL (ref 6.0–8.5)
eGFR: 91 mL/min/{1.73_m2} (ref >59)

## 2022-09-10 LAB — ECHOCARDIOGRAM COMPLETE
Area-P 1/2: 5.27 cm2
S' Lateral: 2.1 cm

## 2022-09-10 LAB — HEPATITIS C ANTIBODY: Hep C Virus Ab: NONREACTIVE

## 2022-10-06 ENCOUNTER — Telehealth: Payer: Self-pay | Admitting: Cardiovascular Disease

## 2022-10-06 NOTE — Telephone Encounter (Signed)
Pt c/o BP issue: STAT if pt c/o blurred vision, one-sided weakness or slurred speech  1. What are your last 5 BP readings? 185/73 - This morning 194/65 - This afternoon 2. Are you having any other symptoms (ex. Dizziness, headache, blurred vision, passed out)? No  3. What is your BP issue?  Pt states that BP has been elevated since yesterday. She would like a callback regarding what to do until upcoming appt. On Friday. Please advise

## 2022-10-06 NOTE — Telephone Encounter (Signed)
She currently takes doxazosin 2 mg daily. Increase doxazosin to 4 mg daily. Continue to monitor blood pressure. Follow-up later this week as planned. Tereso Newcomer, PA-C 10/06/2022 9:20 PM

## 2022-10-06 NOTE — Telephone Encounter (Signed)
Spoke with patient who is concerned about her elevated blood pressure. She reports 185/73 and 194/65, HR 54  this morning. She also states she has some swelling in feet and ankles. Patient asked if she could take an additional dose of Toprol XL 25 mg.   Advised patient to not take additional Toprol due to HR of 54. She has an appointment on 10/10/22 with Tereso Newcomer, PA.  Advised to bring a list of daily BP and HR. Provided education on checking BP, decreasing edema and when to seek emergency treatment. Patient verbalized understanding and had no questions.

## 2022-10-07 MED ORDER — DOXAZOSIN MESYLATE 2 MG PO TABS: 4.0000 mg | ORAL_TABLET | Freq: Every day | ORAL | 3 refills | Status: DC

## 2022-10-07 NOTE — Telephone Encounter (Signed)
Call placed to pt regarding message below.  Pt aware to increase her Doxazosin to 2 mg taking 2 tablets daily.  She will continue to monitor her blood pressures and keep her appointment 10/10/22.

## 2022-10-07 NOTE — Addendum Note (Signed)
Addended by: Burnetta Sabin on: 10/07/2022 08:24 AM   Modules accepted: Orders

## 2022-10-09 DIAGNOSIS — D2371 Other benign neoplasm of skin of right lower limb, including hip: Secondary | ICD-10-CM | POA: Diagnosis not present

## 2022-10-09 DIAGNOSIS — M79673 Pain in unspecified foot: Secondary | ICD-10-CM | POA: Diagnosis not present

## 2022-10-09 DIAGNOSIS — D2372 Other benign neoplasm of skin of left lower limb, including hip: Secondary | ICD-10-CM | POA: Diagnosis not present

## 2022-10-09 NOTE — Progress Notes (Addendum)
Cardiology Office Note:    Date:  10/10/2022  ID:  Deborah Lowe, DOB Jun 29, 1940, MRN [id number] PCP: Junie Spencer, FNP  [facility] Providers Cardiologist:  Kristeen Miss, MD Cardiology APP:  Beatrice Lecher, PA-C          Patient Profile:   Paroxysmal atrial fibrillation TTE 09/10/2022: EF 60-65, no RWMA, normal RVSF, normal PASP, RVSP 30.9, mild LAE, trivial MR, trivial AI, RAP 3 Nonobstructive coronary artery disease  Normal coronary arteries (cath 01/2007)  CCTA 08/19/2022: CAC score 28 (31st percentile), minimal nonobstructive CAD-LAD proximal 1-24, LCx mid 1-24; aortic atherosclerosis, aorta 33 mm Palpitations  Hypertension  Hyperlipidemia  Breast CA s/p lumpectomy (L breast)       History of Present Illness:   Deborah Lowe is a 82 y.o. female who returns for follow-up of atrial fibrillation and hypertension.  She was last seen 08/12/2022.  She had been to the emergency room with an episode of atrial fibrillation.  She had not yet started Eliquis.  I encouraged her to start Eliquis at her last visit.  She did note chest discomfort with atrial fibrillation.  Coronary CTA was obtained and demonstrated minimal plaque, no obstructive CAD.  Echocardiogram demonstrated normal EF.  She went to urgent care with a cough in April.  She was seen by primary care.  Chest x-ray demonstrated atelectasis and small bilateral pleural effusions.  She was placed on antibiotics for treatment of bronchitis.  She called in recently with uncontrolled blood pressures.  I adjusted her doxazosin further to 4 mg daily.  She is here today with her husband. She brings in a list of her BPs. Her BP is always higher here than at home. She has had cuff checked with a Hg cuff at her PCPs office and it is reading correctly. Her BPs have been averaging 160-170s/60s the last couple of weeks. She has not had chest pain, syncope. She notes shortness of breath with activities such as walking up a hill. We stopped  her Amlodipine b/c of lower ext edema. This has not resolved. It is dependent edema. It resolves with elevation.   Review of Systems  Eyes:  Negative for blurred vision.  Gastrointestinal:  Negative for hematochezia and melena.  Genitourinary:  Negative for hematuria.  Neurological:  Negative for headaches.   see HPI    Studies Reviewed:    EKG:  not done  Risk Assessment/Calculations:    CHA2DS2-VASc Score = 5   This indicates a 7.2% annual risk of stroke. The patient's score is based upon: CHF History: 0 HTN History: 1 Diabetes History: 0 Stroke History: 0 Vascular Disease History: 1 Age Score: 2 Gender Score: 1    HYPERTENSION CONTROL Vitals:   10/10/22 1028 10/10/22 1734  BP: (!) 200/98 (!) 200/90    The patient's blood pressure is elevated above target today.  In order to address the patient's elevated BP: A new medication was prescribed today.          Physical Exam:   VS:  BP (!) 200/90   Pulse 64   Ht 5' 2.5" (1.588 m)   Wt 117 lb (53.1 kg)   SpO2 97%   BMI 21.06 kg/m    Wt Readings from Last 3 Encounters:  10/10/22 117 lb (53.1 kg)  09/09/22 116 lb (52.6 kg)  09/05/22 114 lb (51.7 kg)    Constitutional:      Appearance: Healthy appearance. Not in distress.  Neck:  Vascular: JVD normal.  Pulmonary:     Breath sounds: Normal breath sounds. No wheezing. No rales.  Cardiovascular:     Normal rate. Regular rhythm.     Murmurs: There is no murmur.  Edema:    Peripheral edema present.    Ankle: bilateral 1+ edema of the ankle. Abdominal:     Palpations: Abdomen is soft.        ASSESSMENT AND PLAN:   Essential hypertension Her blood pressure is markedly elevated. It is always much higher here than at home. Her cuff at home is accurate. Her BPs have been 160-170s. She has not had much response to Doxazosin. I had to stop her HCTZ b/c of hyponatremia. She had LE edema with Amlodipine and had to stop it. We discussed the higher potency of  Valsartan compared with Losartan. I recommend we switch her to Valsartan. I do not think we should try HCTZ again. She has a low normal K+. We could try Spironolactone if needed. I would also like to give her a medication to take prn for BP spikes. Stop Losartan Start Valsartan 160 mg once daily (increase to 320 mg if there is not an adequate BP response) Start Hydralazine 25 mg once daily as needed for SBP > 170 (sustained) We may need to add Hydralazine three times a day to her regimen if we do not get good control. Continue Metoprolol succinate 25 mg twice daily, Doxazosin 4 mg once daily. Could consider changing Metoprolol succinate to Carvedilol for BP as well.  She maintains a low Na diet. She has no symptoms of OSA. If her BP remains difficult to control, I will arrange Renal Artery Dopplers.  F/u 2 weeks w a BMET.  Paroxysmal atrial fibrillation (HCC) Maintaining NSR by exam. She is tolerating anticoagulation with Eliquis. Her weight is < 60 kg. Once she is 89, we will need to reduce her dose of Eliquis to 2.5 mg. For now, continue Eliquis 5 mg twice daily, Metoprolol succinate 25 mg twice daily.   CAD (coronary artery disease) Minimal nonobstructive plaque on recent CCTA. She is not having chest pain to suggest angina. She is on Eliquis and does not need antiplatelet Rx. Her LDL was 92 in 12/2021. I recommend a goal LDL of < 70. We discussed the benefits of lowering her cholesterol. I recommend starting with Crestor 5 mg once daily. She would like to hold off on this until she gets her BP controlled. I agree and will keep this in mind for future visits.   Pleural effusion Noted on recent CXR. These were small and may have just been atelectasis or her recent bronchitis. She does not have clinical signs or symptoms of congestive heart failure. EF was normal on recent echocardiogram. I will repeat her CXR to make sure the effusions have resolved.   Bilateral lower extremity edema This  seems to be due to venous insufficiency. She has resolution with elevation. Unfortunately, we had to stop HCTZ b/c of hyponatremia. I will give her Lasix 20 mg once daily as needed for edema. I will also give her K+ 10 mEq to take once daily if she takes the Lasix. I have asked her to contact me if she takes the Lasix + K+ more than 2-3 times a week so that I can get f/u labs to recheck her Creatinine and potassium.       Dispo:  Return in 11 days (on 10/21/2022) for Close follow up with Tereso Newcomer, PA-C.  Signed,  Tereso Newcomer, PA-C

## 2022-10-10 ENCOUNTER — Encounter: Payer: Self-pay | Admitting: Physician Assistant

## 2022-10-10 ENCOUNTER — Ambulatory Visit: Payer: Medicare Other | Attending: Physician Assistant | Admitting: Physician Assistant

## 2022-10-10 VITALS — BP 200/90 | HR 64 | Ht 62.5 in | Wt 117.0 lb

## 2022-10-10 DIAGNOSIS — R600 Localized edema: Secondary | ICD-10-CM | POA: Diagnosis not present

## 2022-10-10 DIAGNOSIS — I2510 Atherosclerotic heart disease of native coronary artery without angina pectoris: Secondary | ICD-10-CM | POA: Diagnosis not present

## 2022-10-10 DIAGNOSIS — I480 Paroxysmal atrial fibrillation: Secondary | ICD-10-CM | POA: Insufficient documentation

## 2022-10-10 DIAGNOSIS — J90 Pleural effusion, not elsewhere classified: Secondary | ICD-10-CM | POA: Insufficient documentation

## 2022-10-10 DIAGNOSIS — I10 Essential (primary) hypertension: Secondary | ICD-10-CM | POA: Diagnosis not present

## 2022-10-10 MED ORDER — VALSARTAN 160 MG PO TABS: 160.0000 mg | ORAL_TABLET | Freq: Every day | ORAL | 3 refills | Status: DC

## 2022-10-10 MED ORDER — HYDRALAZINE HCL 25 MG PO TABS: ORAL_TABLET | ORAL | 3 refills | Status: DC

## 2022-10-10 MED ORDER — POTASSIUM CHLORIDE CRYS ER 10 MEQ PO TBCR: EXTENDED_RELEASE_TABLET | ORAL | 3 refills | Status: DC

## 2022-10-10 MED ORDER — FUROSEMIDE 20 MG PO TABS: ORAL_TABLET | ORAL | 3 refills | Status: DC

## 2022-10-10 NOTE — Patient Instructions (Signed)
Medication Instructions:   DISCONTINUE Losartan. START Valsartan one (1) tablet by mouth ( 160 mg) daily. START Hydralazine one (1) tablet by mouth ( 25 mg) as needed for BP> 170 X 2 20 min apart.  START Lasix one ( 1) tablet by mouth ( 20 mg) daily as needed for swelling. Take with potassium. START K-dur one (1) tablet by mouth ( 10 mEq) daily as needed for swelling. Take with lasix.   *If you need a refill on your cardiac medications before your next appointment, please call your pharmacy*   Lab Work:  BMET on the day of your appointment with Lorin Picket.   If you have labs (blood work) drawn today and your tests are completely normal, you will receive your results only by: MyChart Message (if you have MyChart) OR A paper copy in the mail If you have any lab test that is abnormal or we need to change your treatment, we will call you to review the results.   Testing/Procedures:  A chest x-ray takes a picture of the organs and structures inside the chest, including the heart, lungs, and blood vessels. This test can show several things, including, whether the heart is enlarges; whether fluid is building up in the lungs; and whether pacemaker / defibrillator leads are still in place.    Follow-Up: At [facility], you and your health needs are our priority.  As part of our continuing mission to provide you with exceptional heart care, we have created designated Provider Care Teams.  These Care Teams include your primary Cardiologist (physician) and Advanced Practice Providers (APPs -  Physician Assistants and Nurse Practitioners) who all work together to provide you with the care you need, when you need it.  We recommend signing up for the patient portal called "MyChart".  Sign up information is provided on this After Visit Summary.  MyChart is used to connect with patients for Virtual Visits (Telemedicine).  Patients are able to view lab/test results, encounter notes, upcoming  appointments, etc.  Non-urgent messages can be sent to your provider as well.   To learn more about what you can do with MyChart, go to [email or web address].    Your next appointment:   11 day(s)  Provider:   Tereso Newcomer, PA-C

## 2022-10-10 NOTE — Assessment & Plan Note (Signed)
This seems to be due to venous insufficiency. She has resolution with elevation. Unfortunately, we had to stop HCTZ b/c of hyponatremia. I will give her Lasix 20 mg once daily as needed for edema. I will also give her K+ 10 mEq to take once daily if she takes the Lasix. I have asked her to contact me if she takes the Lasix + K+ more than 2-3 times a week so that I can get f/u labs to recheck her Creatinine and potassium.

## 2022-10-10 NOTE — Assessment & Plan Note (Signed)
Noted on recent CXR. These were small and may have just been atelectasis or her recent bronchitis. She does not have clinical signs or symptoms of congestive heart failure. EF was normal on recent echocardiogram. I will repeat her CXR to make sure the effusions have resolved.

## 2022-10-10 NOTE — Assessment & Plan Note (Signed)
Minimal nonobstructive plaque on recent CCTA. She is not having chest pain to suggest angina. She is on Eliquis and does not need antiplatelet Rx. Her LDL was 92 in 12/2021. I recommend a goal LDL of < 70. We discussed the benefits of lowering her cholesterol. I recommend starting with Crestor 5 mg once daily. She would like to hold off on this until she gets her BP controlled. I agree and will keep this in mind for future visits.

## 2022-10-10 NOTE — Assessment & Plan Note (Addendum)
Her blood pressure is markedly elevated. It is always much higher here than at home. Her cuff at home is accurate. Her BPs have been 160-170s. She has not had much response to Doxazosin. I had to stop her HCTZ b/c of hyponatremia. She had LE edema with Amlodipine and had to stop it. We discussed the higher potency of Valsartan compared with Losartan. I recommend we switch her to Valsartan. I do not think we should try HCTZ again. She has a low normal K+. We could try Spironolactone if needed. I would also like to give her a medication to take prn for BP spikes. Stop Losartan Start Valsartan 160 mg once daily (increase to 320 mg if there is not an adequate BP response) Start Hydralazine 25 mg once daily as needed for SBP > 170 (sustained) We may need to add Hydralazine three times a day to her regimen if we do not get good control. Continue Metoprolol succinate 25 mg twice daily, Doxazosin 4 mg once daily. Could consider changing Metoprolol succinate to Carvedilol for BP as well.  She maintains a low Na diet. She has no symptoms of OSA. If her BP remains difficult to control, I will arrange Renal Artery Dopplers.  F/u 2 weeks w a BMET.

## 2022-10-10 NOTE — Assessment & Plan Note (Signed)
Maintaining NSR by exam. She is tolerating anticoagulation with Eliquis. Her weight is < 60 kg. Once she is 67, we will need to reduce her dose of Eliquis to 2.5 mg. For now, continue Eliquis 5 mg twice daily, Metoprolol succinate 25 mg twice daily.

## 2022-10-17 ENCOUNTER — Other Ambulatory Visit: Payer: Self-pay | Admitting: Physician Assistant

## 2022-10-17 ENCOUNTER — Encounter: Payer: Self-pay | Admitting: Family

## 2022-10-17 ENCOUNTER — Ambulatory Visit (INDEPENDENT_AMBULATORY_CARE_PROVIDER_SITE_OTHER): Payer: Medicare Other | Admitting: Family

## 2022-10-17 VITALS — BP 183/82 | HR 60 | Temp 97.6°F | Ht 62.4 in | Wt 118.4 lb

## 2022-10-17 DIAGNOSIS — F419 Anxiety disorder, unspecified: Secondary | ICD-10-CM | POA: Diagnosis not present

## 2022-10-17 DIAGNOSIS — R7989 Other specified abnormal findings of blood chemistry: Secondary | ICD-10-CM | POA: Diagnosis not present

## 2022-10-17 DIAGNOSIS — I2510 Atherosclerotic heart disease of native coronary artery without angina pectoris: Secondary | ICD-10-CM | POA: Diagnosis not present

## 2022-10-17 DIAGNOSIS — E7800 Pure hypercholesterolemia, unspecified: Secondary | ICD-10-CM | POA: Diagnosis not present

## 2022-10-17 DIAGNOSIS — Z853 Personal history of malignant neoplasm of breast: Secondary | ICD-10-CM

## 2022-10-17 DIAGNOSIS — I480 Paroxysmal atrial fibrillation: Secondary | ICD-10-CM

## 2022-10-17 DIAGNOSIS — I10 Essential (primary) hypertension: Secondary | ICD-10-CM | POA: Diagnosis not present

## 2022-10-17 MED ORDER — AMLODIPINE BESYLATE 5 MG PO TABS
5.0000 mg | ORAL_TABLET | Freq: Every day | ORAL | 1 refills | Status: DC
Start: 2022-10-17 — End: 2023-08-25

## 2022-10-17 NOTE — Patient Instructions (Signed)
Hypertension, Adult High blood pressure (hypertension) is when the force of blood pumping through the arteries is too strong. The arteries are the blood vessels that carry blood from the heart throughout the body. Hypertension forces the heart to work harder to pump blood and may cause arteries to become narrow or stiff. Untreated or uncontrolled hypertension can lead to a heart attack, heart failure, a stroke, kidney disease, and other problems. A blood pressure reading consists of a higher number over a lower number. Ideally, your blood pressure should be below 120/80. The first ("top") number is called the systolic pressure. It is a measure of the pressure in your arteries as your heart beats. The second ("bottom") number is called the diastolic pressure. It is a measure of the pressure in your arteries as the heart relaxes. What are the causes? The exact cause of this condition is not known. There are some conditions that result in high blood pressure. What increases the risk? Certain factors may make you more likely to develop high blood pressure. Some of these risk factors are under your control, including: Smoking. Not getting enough exercise or physical activity. Being overweight. Having too much fat, sugar, calories, or salt (sodium) in your diet. Drinking too much alcohol. Other risk factors include: Having a personal history of heart disease, diabetes, high cholesterol, or kidney disease. Stress. Having a family history of high blood pressure and high cholesterol. Having obstructive sleep apnea. Age. The risk increases with age. What are the signs or symptoms? High blood pressure may not cause symptoms. Very high blood pressure (hypertensive crisis) may cause: Headache. Fast or irregular heartbeats (palpitations). Shortness of breath. Nosebleed. Nausea and vomiting. Vision changes. Severe chest pain, dizziness, and seizures. How is this diagnosed? This condition is diagnosed by  measuring your blood pressure while you are seated, with your arm resting on a flat surface, your legs uncrossed, and your feet flat on the floor. The cuff of the blood pressure monitor will be placed directly against the skin of your upper arm at the level of your heart. Blood pressure should be measured at least twice using the same arm. Certain conditions can cause a difference in blood pressure between your right and left arms. If you have a high blood pressure reading during one visit or you have normal blood pressure with other risk factors, you may be asked to: Return on a different day to have your blood pressure checked again. Monitor your blood pressure at home for 1 week or longer. If you are diagnosed with hypertension, you may have other blood or imaging tests to help your health care provider understand your overall risk for other conditions. How is this treated? This condition is treated by making healthy lifestyle changes, such as eating healthy foods, exercising more, and reducing your alcohol intake. You may be referred for counseling on a healthy diet and physical activity. Your health care provider may prescribe medicine if lifestyle changes are not enough to get your blood pressure under control and if: Your systolic blood pressure is above 130. Your diastolic blood pressure is above 80. Your personal target blood pressure may vary depending on your medical conditions, your age, and other factors. Follow these instructions at home: Eating and drinking  Eat a diet that is high in fiber and potassium, and low in sodium, added sugar, and fat. An example of this eating plan is called the DASH diet. DASH stands for Dietary Approaches to Stop Hypertension. To eat this way: Eat   plenty of fresh fruits and vegetables. Try to fill one half of your plate at each meal with fruits and vegetables. Eat whole grains, such as whole-wheat pasta, brown rice, or whole-grain bread. Fill about one  fourth of your plate with whole grains. Eat or drink low-fat dairy products, such as skim milk or low-fat yogurt. Avoid fatty cuts of meat, processed or cured meats, and poultry with skin. Fill about one fourth of your plate with lean proteins, such as fish, chicken without skin, beans, eggs, or tofu. Avoid pre-made and processed foods. These tend to be higher in sodium, added sugar, and fat. Reduce your daily sodium intake. Many people with hypertension should eat less than 1,500 mg of sodium a day. Do not drink alcohol if: Your health care provider tells you not to drink. You are pregnant, may be pregnant, or are planning to become pregnant. If you drink alcohol: Limit how much you have to: 0-1 drink a day for women. 0-2 drinks a day for men. Know how much alcohol is in your drink. In the [place] one drink equals one 12 oz bottle of beer (355 mL), one 5 oz glass of wine (148 mL), or one 1 oz glass of hard liquor (44 mL). Lifestyle  Work with your health care provider to maintain a healthy body weight or to lose weight. Ask what an ideal weight is for you. Get at least 30 minutes of exercise that causes your heart to beat faster (aerobic exercise) most days of the week. Activities may include walking, swimming, or biking. Include exercise to strengthen your muscles (resistance exercise), such as Pilates or lifting weights, as part of your weekly exercise routine. Try to do these types of exercises for 30 minutes at least 3 days a week. Do not use any products that contain nicotine or tobacco. These products include cigarettes, chewing tobacco, and vaping devices, such as e-cigarettes. If you need help quitting, ask your health care provider. Monitor your blood pressure at home as told by your health care provider. Keep all follow-up visits. This is important. Medicines Take over-the-counter and prescription medicines only as told by your health care provider. Follow directions carefully. Blood  pressure medicines must be taken as prescribed. Do not skip doses of blood pressure medicine. Doing this puts you at risk for problems and can make the medicine less effective. Ask your health care provider about side effects or reactions to medicines that you should watch for. Contact a health care provider if you: Think you are having a reaction to a medicine you are taking. Have headaches that keep coming back (recurring). Feel dizzy. Have swelling in your ankles. Have trouble with your vision. Get help right away if you: Develop a severe headache or confusion. Have unusual weakness or numbness. Feel faint. Have severe pain in your chest or abdomen. Vomit repeatedly. Have trouble breathing. These symptoms may be an emergency. Get help right away. Call 911. Do not wait to see if the symptoms will go away. Do not drive yourself to the hospital. Summary Hypertension is when the force of blood pumping through your arteries is too strong. If this condition is not controlled, it may put you at risk for serious complications. Your personal target blood pressure may vary depending on your medical conditions, your age, and other factors. For most people, a normal blood pressure is less than 120/80. Hypertension is treated with lifestyle changes, medicines, or a combination of both. Lifestyle changes include losing weight, eating a healthy,   low-sodium diet, exercising more, and limiting alcohol. This information is not intended to replace advice given to you by your health care provider. Make sure you discuss any questions you have with your health care provider. Document Revised: 03/19/2021 Document Reviewed: 03/19/2021 Elsevier Patient Education  2024 Elsevier Inc.  

## 2022-10-17 NOTE — Progress Notes (Signed)
Subjective:    Patient ID: Deborah Lowe, female    DOB: 1940-11-28, 82 y.o.   MRN: [id number]  Chief Complaint  Patient presents with   Hypertension   PT presents to the office for chronic follow up.  She was seen at Cardiologists every 3 months for A FIb, CAD, and HTN. Taking Eliquis 5 mg BID. Stable.    She is followed by GYN annually.    Has hx of left breast cancer and gets annual mammograms.  She has also had an abnormal TSH and needs rechecked today.  Hypertension This is a chronic problem. The current episode started more than 1 year ago. The problem has been waxing and waning since onset. The problem is uncontrolled. Associated symptoms include anxiety, malaise/fatigue and peripheral edema. Pertinent negatives include no shortness of breath. Risk factors for coronary artery disease include dyslipidemia and sedentary lifestyle. Past treatments include beta blockers, diuretics and angiotensin blockers. The current treatment provides mild improvement. There is no history of heart failure.  Anxiety Presents for follow-up visit. Symptoms include excessive worry and nervous/anxious behavior. Patient reports no shortness of breath. Symptoms occur occasionally. The severity of symptoms is moderate.        Review of Systems  Constitutional:  Positive for malaise/fatigue.  Respiratory:  Negative for shortness of breath.   Psychiatric/Behavioral:  The patient is nervous/anxious.   All other systems reviewed and are negative.      Objective:   Physical Exam Vitals reviewed.  Constitutional:      General: She is not in acute distress.    Appearance: She is well-developed.  HENT:     Head: Normocephalic and atraumatic.     Right Ear: Tympanic membrane normal.     Left Ear: Tympanic membrane normal.  Eyes:     Pupils: Pupils are equal, round, and reactive to light.  Neck:     Thyroid: No thyromegaly.  Cardiovascular:     Rate and Rhythm: Normal rate and regular rhythm.      Heart sounds: Normal heart sounds. No murmur heard. Pulmonary:     Effort: Pulmonary effort is normal. No respiratory distress.     Breath sounds: Normal breath sounds. No wheezing.  Abdominal:     General: Bowel sounds are normal. There is no distension.     Palpations: Abdomen is soft.     Tenderness: There is no abdominal tenderness.  Musculoskeletal:        General: No tenderness. Normal range of motion.     Cervical back: Normal range of motion and neck supple.     Right lower leg: Edema (trace) present.     Left lower leg: Edema (trace) present.  Skin:    General: Skin is warm and dry.  Neurological:     Mental Status: She is alert and oriented to person, place, and time.     Cranial Nerves: No cranial nerve deficit.     Deep Tendon Reflexes: Reflexes are normal and symmetric.  Psychiatric:        Behavior: Behavior normal.        Thought Content: Thought content normal.        Judgment: Judgment normal.       BP (!) 188/79   Pulse (!) 59   Temp 97.6 F (36.4 C) (Temporal)   Ht 5' 2.4" (1.585 m)   Wt 118 lb 6.4 oz (53.7 kg)   SpO2 97%   BMI 21.38 kg/m      Assessment &  Plan:  Deborah Lowe comes in today with chief complaint of Hypertension   Diagnosis and orders addressed:  1. Anxiety Continue Lexapro  - CMP14+EGFR - TSH  2. Coronary artery disease involving native coronary artery of native heart without angina pectoris - amLODipine (NORVASC) 5 MG tablet; Take 1 tablet (5 mg total) by mouth daily.  Dispense: 90 tablet; Refill: 1 - CMP14+EGFR  3. Paroxysmal atrial fibrillation (HCC) - Ambulatory referral to [facility] - CMP14+EGFR - TSH  4. Pure hypercholesterolemia - CMP14+EGFR  5. History of left breast cancer - CMP14+EGFR  6. Essential hypertension Start Norvasc 5 mg  Referral to HTN clinic  -Daily blood pressure log given with instructions on how to fill out and told to bring to next visit -Dash  diet information given -Exercise encouraged - Stress Management  -Continue current meds -RTO in 2-4 weeks  - amLODipine (NORVASC) 5 MG tablet; Take 1 tablet (5 mg total) by mouth daily.  Dispense: 90 tablet; Refill: 1 - Ambulatory referral to [facility] - CMP14+EGFR - TSH  7. Abnormal TSH - CMP14+EGFR - TSH   Labs pending Health Maintenance reviewed Diet and exercise encouraged  Follow up plan: 2-4 weeks to recheck HTN   Jannifer Rodney, FNP

## 2022-10-18 LAB — CMP14+EGFR
ALT: 15 IU/L (ref 0–32)
AST: 11 IU/L (ref 0–40)
Albumin/Globulin Ratio: 2 (ref 1.2–2.2)
Albumin: 4.2 g/dL (ref 3.8–4.8)
Alkaline Phosphatase: 97 IU/L (ref 44–121)
BUN/Creatinine Ratio: 18 (ref 12–28)
BUN: 13 mg/dL (ref 8–27)
Bilirubin Total: 0.8 mg/dL (ref 0.0–1.2)
CO2: 22 mmol/L (ref 20–29)
Calcium: 9.4 mg/dL (ref 8.7–10.3)
Chloride: 101 mmol/L (ref 96–106)
Creatinine, Ser: 0.73 mg/dL (ref 0.57–1.00)
Globulin, Total: 2.1 g/dL (ref 1.5–4.5)
Glucose: 90 mg/dL (ref 70–99)
Potassium: 4.6 mmol/L (ref 3.5–5.2)
Sodium: 137 mmol/L (ref 134–144)
Total Protein: 6.3 g/dL (ref 6.0–8.5)
eGFR: 84 mL/min/1.73

## 2022-10-18 LAB — TSH: TSH: 3.31 u[IU]/mL (ref 0.450–4.500)

## 2022-10-20 NOTE — Progress Notes (Unsigned)
Cardiology Office Note:    Date:  10/21/2022  ID:  SHERICA FROMETA, DOB Feb 27, 1941, MRN [id number] PCP: Junie Spencer, FNP  [facility] Providers Cardiologist:  Kristeen Miss, MD Cardiology APP:  Beatrice Lecher, PA-C           Patient Profile:   Paroxysmal atrial fibrillation TTE 09/10/2022: EF 60-65, no RWMA, normal RVSF, normal PASP, RVSP 30.9, mild LAE, trivial MR, trivial AI, RAP 3 Nonobstructive coronary artery disease  Normal coronary arteries (cath 01/2007)  CCTA 08/19/2022: CAC score 28 (31st percentile), minimal nonobstructive CAD-LAD proximal 1-24, LCx mid 1-24; aortic atherosclerosis, aorta 33 mm Palpitations  Hypertension  Hyperlipidemia  Breast CA s/p lumpectomy (L breast)        History of Present Illness:   Deborah Lowe is a 82 y.o. female who returns for f/u of hypertension. She was last seen 10/10/22. I changed Losartan to Valsartan and gave her Hydralazine 25 mg to take prn high BP. She saw her PCP recently who placed her back on Amlodipine (previously stopped due to edema). She has been referred to the [facility].  She is here with her husband.  She brings in a list of her blood pressures.  Most of her recent systolic readings have been 150s-160s.  She started taking furosemide and potassium daily about 5 to 6 days ago.  She has not started on amlodipine.  I reminded her that she had worsening edema when she was started on this previously and we stopped it.  She has not had chest pain, shortness of breath, syncope, orthopnea.  ROS  See HPI    Studies Reviewed:    EKG: Not done  Risk Assessment/Calculations:    CHA2DS2-VASc Score = 5   This indicates a 7.2% annual risk of stroke. The patient's score is based upon: CHF History: 0 HTN History: 1 Diabetes History: 0 Stroke History: 0 Vascular Disease History: 1 Age Score: 2 Gender Score: 1    HYPERTENSION CONTROL Vitals:   10/21/22 1343 10/21/22 1425  BP: (!) 150/59 (!) 184/80    The  patient's blood pressure is elevated above target today.  In order to address the patient's elevated BP: A current anti-hypertensive medication was adjusted today.          Physical Exam:   VS:  BP (!) 184/80   Pulse 70   Ht 5\' 1"  (1.549 m)   Wt 116 lb 9.6 oz (52.9 kg)   SpO2 98%   BMI 22.03 kg/m    Wt Readings from Last 3 Encounters:  10/21/22 116 lb 9.6 oz (52.9 kg)  10/17/22 118 lb 6.4 oz (53.7 kg)  10/10/22 117 lb (53.1 kg)    Constitutional:      Appearance: Healthy appearance. Not in distress.  Neck:     Vascular: JVD normal.  Pulmonary:     Breath sounds: Normal breath sounds. No wheezing. No rales.  Cardiovascular:     Normal rate. Regular rhythm.     Murmurs: There is no murmur.  Edema:    Peripheral edema present.    Ankle: bilateral trace edema of the ankle. Abdominal:     Palpations: Abdomen is soft.       ASSESSMENT AND PLAN:   Essential hypertension BP is improved but remains above target. Since she had leg edema with Amlodipine previously, I recommend remaining off of this. Her BP seems to be better since she started on Lasix once daily. Hopefully, her Na will tolerate  this. We had to stop HCTZ due to hyponatremia in the past. She has had normal renal function. Since her BP is improving, I do not think she needs renal artery [place] to r/o RAS. I do think an evaluation at the [facility] Clinic is a good idea.  Increase Valsartan to 320 mg once daily Continue doxazosin 4 mg daily, Lasix 20 mg daily, K+ 10 mEq daily, Toprol-XL 25 mg twice daily, hydralazine 25 mg as needed BMET today BMET 1 week Follow-up 6 to 8 weeks Advanced hypertension clinic appointment pending      Dispo:  Return in about 6 weeks (around 12/02/2022) for Routine Follow Up, w/ Tereso Newcomer, PA-C.  Signed, Tereso Newcomer, PA-C

## 2022-10-21 ENCOUNTER — Encounter: Payer: Self-pay | Admitting: Physician Assistant

## 2022-10-21 ENCOUNTER — Ambulatory Visit: Payer: Medicare Other | Attending: Physician Assistant | Admitting: Physician Assistant

## 2022-10-21 VITALS — BP 184/80 | HR 70 | Ht 61.0 in | Wt 116.6 lb

## 2022-10-21 DIAGNOSIS — I10 Essential (primary) hypertension: Secondary | ICD-10-CM | POA: Diagnosis not present

## 2022-10-21 MED ORDER — VALSARTAN 320 MG PO TABS: 320.0000 mg | ORAL_TABLET | Freq: Every day | ORAL | 1 refills | Status: DC

## 2022-10-21 MED ORDER — POTASSIUM CHLORIDE CRYS ER 10 MEQ PO TBCR: 10.0000 meq | EXTENDED_RELEASE_TABLET | Freq: Two times a day (BID) | ORAL | 1 refills | Status: DC

## 2022-10-21 MED ORDER — FUROSEMIDE 20 MG PO TABS: 20.0000 mg | ORAL_TABLET | Freq: Every day | ORAL | 3 refills | Status: DC

## 2022-10-21 NOTE — Assessment & Plan Note (Signed)
BP is improved but remains above target. Since she had leg edema with Amlodipine previously, I recommend remaining off of this. Her BP seems to be better since she started on Lasix once daily. Hopefully, her Na will tolerate this. We had to stop HCTZ due to hyponatremia in the past. She has had normal renal function. Since her BP is improving, I do not think she needs renal artery [place] to r/o RAS. I do think an evaluation at the [facility] is a good idea.  Increase Valsartan to 320 mg once daily Continue doxazosin 4 mg daily, Lasix 20 mg daily, K+ 10 mEq daily, Toprol-XL 25 mg twice daily, hydralazine 25 mg as needed BMET today BMET 1 week Follow-up 6 to 8 weeks Advanced hypertension clinic appointment pending

## 2022-10-21 NOTE — Patient Instructions (Signed)
Medication Instructions:  Your physician has recommended you make the following change in your medication:   INCREASE the Valsartan to 320 mg taking 1 daily  CHANGE the Lasix and Potassium to 1 daily  *If you need a refill on your cardiac medications before your next appointment, please call your pharmacy*   Lab Work: TODAY:  BMET  11/03/22:  COME TO THE OFFICE ANYTIME AFTER 7:15 FOR:  BMET  If you have labs (blood work) drawn today and your tests are completely normal, you will receive your results only by: MyChart Message (if you have MyChart) OR A paper copy in the mail If you have any lab test that is abnormal or we need to change your treatment, we will call you to review the results.   Testing/Procedures: None ordered   Follow-Up: At [facility], you and your health needs are our priority.  As part of our continuing mission to provide you with exceptional heart care, we have created designated Provider Care Teams.  These Care Teams include your primary Cardiologist (physician) and Advanced Practice Providers (APPs -  Physician Assistants and Nurse Practitioners) who all work together to provide you with the care you need, when you need it.  We recommend signing up for the patient portal called "MyChart".  Sign up information is provided on this After Visit Summary.  MyChart is used to connect with patients for Virtual Visits (Telemedicine).  Patients are able to view lab/test results, encounter notes, upcoming appointments, etc.  Non-urgent messages can be sent to your provider as well.   To learn more about what you can do with MyChart, go to [email or web address].    Your next appointment:   6 week(s)  Provider:   Tereso Newcomer, PA-C         Other Instructions

## 2022-10-22 LAB — BASIC METABOLIC PANEL
BUN/Creatinine Ratio: 16 (ref 12–28)
BUN: 12 mg/dL (ref 8–27)
CO2: 22 mmol/L (ref 20–29)
Calcium: 9.2 mg/dL (ref 8.7–10.3)
Chloride: 101 mmol/L (ref 96–106)
Creatinine, Ser: 0.76 mg/dL (ref 0.57–1.00)
Glucose: 102 mg/dL — ABNORMAL HIGH (ref 70–99)
Potassium: 3.9 mmol/L (ref 3.5–5.2)
Sodium: 137 mmol/L (ref 134–144)
eGFR: 80 mL/min/{1.73_m2} (ref >59)

## 2022-11-03 ENCOUNTER — Ambulatory Visit: Payer: Medicare Other | Attending: Physician Assistant

## 2022-11-03 ENCOUNTER — Encounter: Payer: Self-pay | Admitting: *Deleted

## 2022-11-03 DIAGNOSIS — I10 Essential (primary) hypertension: Secondary | ICD-10-CM

## 2022-11-04 LAB — BASIC METABOLIC PANEL
BUN/Creatinine Ratio: 18 (ref 12–28)
BUN: 13 mg/dL (ref 8–27)
CO2: 25 mmol/L (ref 20–29)
Calcium: 9.4 mg/dL (ref 8.7–10.3)
Chloride: 100 mmol/L (ref 96–106)
Creatinine, Ser: 0.72 mg/dL (ref 0.57–1.00)
Glucose: 81 mg/dL (ref 70–99)
Potassium: 4.7 mmol/L (ref 3.5–5.2)
Sodium: 137 mmol/L (ref 134–144)
eGFR: 85 mL/min/{1.73_m2} (ref >59)

## 2022-11-21 DIAGNOSIS — Z1231 Encounter for screening mammogram for malignant neoplasm of breast: Secondary | ICD-10-CM | POA: Diagnosis not present

## 2022-12-09 ENCOUNTER — Encounter: Payer: Self-pay | Admitting: Physician Assistant

## 2022-12-09 ENCOUNTER — Ambulatory Visit: Payer: Medicare Other | Attending: Physician Assistant | Admitting: Physician Assistant

## 2022-12-09 VITALS — BP 170/72 | HR 60 | Ht 61.0 in | Wt 115.4 lb

## 2022-12-09 DIAGNOSIS — I10 Essential (primary) hypertension: Secondary | ICD-10-CM | POA: Diagnosis present

## 2022-12-09 DIAGNOSIS — I2510 Atherosclerotic heart disease of native coronary artery without angina pectoris: Secondary | ICD-10-CM | POA: Insufficient documentation

## 2022-12-09 DIAGNOSIS — I480 Paroxysmal atrial fibrillation: Secondary | ICD-10-CM | POA: Diagnosis present

## 2022-12-09 MED ORDER — VALSARTAN 160 MG PO TABS: 160.0000 mg | ORAL_TABLET | Freq: Every day | ORAL | 3 refills | Status: DC

## 2022-12-09 NOTE — Progress Notes (Signed)
Cardiology Office Note:    Date:  12/09/2022  ID:  Deborah Lowe, DOB 07-16-1940, MRN [id number] PCP: Junie Spencer, FNP  [facility] Providers Cardiologist:  Kristeen Miss, MD Cardiology APP:  Beatrice Lecher, PA-C        Patient Profile:      Paroxysmal atrial fibrillation TTE 09/10/2022: EF 60-65, no RWMA, normal RVSF, normal PASP, RVSP 30.9, mild LAE, trivial MR, trivial AI, RAP 3 Nonobstructive coronary artery disease  Normal coronary arteries (cath 01/2007)  CCTA 08/19/2022: CAC score 28 (31st percentile), minimal nonobstructive CAD-LAD proximal 1-24, LCx mid 1-24; aortic atherosclerosis, aorta 33 mm Palpitations  Hypertension  Hyperlipidemia  Breast CA s/p lumpectomy (L breast)         History of Present Illness:   Deborah Lowe is a 82 y.o. female who returns for follow-up of hypertension.  She was last seen 10/21/2022.  She has been doing well without chest pain, shortness of breath, palpitations, lower extremity edema.  Her blood pressures at home have been optimal (120s/70s or less).  ROS see HPI    Studies Reviewed:       Risk Assessment/Calculations:    CHA2DS2-VASc Score = 5   This indicates a 7.2% annual risk of stroke. The patient's score is based upon: CHF History: 0 HTN History: 1 Diabetes History: 0 Stroke History: 0 Vascular Disease History: 1 Age Score: 2 Gender Score: 1    HYPERTENSION CONTROL Vitals:   12/09/22 1348 12/09/22 1418  BP: (!) 160/60 (!) 170/72    The patient's blood pressure is elevated above target today.  In order to address the patient's elevated BP: The blood pressure is usually elevated in clinic.  Blood pressures monitored at home have been optimal.          Physical Exam:   VS:  BP (!) 170/72   Pulse 60   Ht 5\' 1"  (1.549 m)   Wt 115 lb 6.4 oz (52.3 kg)   SpO2 98%   BMI 21.80 kg/m    Wt Readings from Last 3 Encounters:  12/09/22 115 lb 6.4 oz (52.3 kg)  10/21/22 116 lb 9.6 oz (52.9 kg)  10/17/22 118  lb 6.4 oz (53.7 kg)    Constitutional:      Appearance: Healthy appearance. Not in distress.  Neck:     Vascular: JVD normal.  Pulmonary:     Breath sounds: Normal breath sounds. No wheezing. No rales.  Cardiovascular:     Normal rate. Regular rhythm.     Murmurs: There is no murmur.  Edema:    Peripheral edema absent.  Abdominal:     Palpations: Abdomen is soft.       ASSESSMENT AND PLAN:   Essential hypertension Blood pressure is improved.  She is feeling better.  She brings in a list of her blood pressures from home.  They are typically elevated in the office.  Most recent blood pressures are optimal at home.  She has only been taking valsartan 160 mg daily since last visit.  Continue valsartan 160 mg daily, Toprol-XL 25 mg twice daily, Lasix 20 mg daily, doxazosin 4 mg daily, amlodipine 5 mg daily.  She has an appointment with Dr. Duke Salvia in the advanced hypertension clinic in a few weeks.  I have asked her to keep this appointment for now.  If her blood pressure continues to run well at home, she can cancel.  Follow-up 6 months.  CAD (coronary artery disease) Minimal nonobstructive plaque on  recent CCTA. She is not having chest pain to suggest angina. She is on Eliquis and does not need antiplatelet Rx. Her LDL was 92 in 12/2021.  She has had a lot of medication adjustments recently.  She will likely have a physical exam in August.  When she returns in 6 months, if her LDL remains above 70, we will consider low-dose rosuvastatin.  Paroxysmal atrial fibrillation (HCC) Maintaining sinus rhythm by exam.  She is tolerating anticoagulation well.  Continue Eliquis 5 mg twice daily, metoprolol succinate 25 mg twice daily.    Dispo:  Return in about 6 months (around 06/11/2023) for Routine Follow Up, w/ Tereso Newcomer, PA-C.  Signed, Tereso Newcomer, PA-C

## 2022-12-09 NOTE — Assessment & Plan Note (Signed)
Maintaining sinus rhythm by exam.  She is tolerating anticoagulation well.  Continue Eliquis 5 mg twice daily, metoprolol succinate 25 mg twice daily.

## 2022-12-09 NOTE — Patient Instructions (Addendum)
Medication Instructions:  Your physician recommends that you continue on your current medications as directed. Please refer to the Current Medication list given to you today.  *If you need a refill on your cardiac medications before your next appointment, please call your pharmacy*   Lab Work: None ordered If you have labs (blood work) drawn today and your tests are completely normal, you will receive your results only by: MyChart Message (if you have MyChart) OR A paper copy in the mail If you have any lab test that is abnormal or we need to change your treatment, we will call you to review the results.   Testing/Procedures: None ordered   Follow-Up: At [facility], you and your health needs are our priority.  As part of our continuing mission to provide you with exceptional heart care, we have created designated Provider Care Teams.  These Care Teams include your primary Cardiologist (physician) and Advanced Practice Providers (APPs -  Physician Assistants and Nurse Practitioners) who all work together to provide you with the care you need, when you need it.  We recommend signing up for the patient portal called "MyChart".  Sign up information is provided on this After Visit Summary.  MyChart is used to connect with patients for Virtual Visits (Telemedicine).  Patients are able to view lab/test results, encounter notes, upcoming appointments, etc.  Non-urgent messages can be sent to your provider as well.   To learn more about what you can do with MyChart, go to [email or web address].    Your next appointment:   As scheduled   Provider:   Dr. Chilton Si     6 months with Tereso Newcomer Other Instructions

## 2022-12-09 NOTE — Assessment & Plan Note (Signed)
Minimal nonobstructive plaque on recent CCTA. She is not having chest pain to suggest angina. She is on Eliquis and does not need antiplatelet Rx. Her LDL was 92 in 12/2021.  She has had a lot of medication adjustments recently.  She will likely have a physical exam in August.  When she returns in 6 months, if her LDL remains above 70, we will consider low-dose rosuvastatin.

## 2022-12-09 NOTE — Assessment & Plan Note (Signed)
Blood pressure is improved.  She is feeling better.  She brings in a list of her blood pressures from home.  They are typically elevated in the office.  Most recent blood pressures are optimal at home.  She has only been taking valsartan 160 mg daily since last visit.  Continue valsartan 160 mg daily, Toprol-XL 25 mg twice daily, Lasix 20 mg daily, doxazosin 4 mg daily, amlodipine 5 mg daily.  She has an appointment with Dr. Duke Salvia in the advanced hypertension clinic in a few weeks.  I have asked her to keep this appointment for now.  If her blood pressure continues to run well at home, she can cancel.  Follow-up 6 months.

## 2022-12-12 ENCOUNTER — Encounter: Payer: Self-pay | Admitting: Family

## 2022-12-12 ENCOUNTER — Ambulatory Visit (INDEPENDENT_AMBULATORY_CARE_PROVIDER_SITE_OTHER): Payer: Medicare Other | Admitting: Family

## 2022-12-12 VITALS — BP 128/52 | HR 52 | Temp 97.9°F | Ht 61.0 in | Wt 112.0 lb

## 2022-12-12 DIAGNOSIS — I2510 Atherosclerotic heart disease of native coronary artery without angina pectoris: Secondary | ICD-10-CM

## 2022-12-12 DIAGNOSIS — F419 Anxiety disorder, unspecified: Secondary | ICD-10-CM | POA: Diagnosis not present

## 2022-12-12 DIAGNOSIS — I480 Paroxysmal atrial fibrillation: Secondary | ICD-10-CM

## 2022-12-12 DIAGNOSIS — I10 Essential (primary) hypertension: Secondary | ICD-10-CM | POA: Diagnosis not present

## 2022-12-12 NOTE — Patient Instructions (Signed)
Hypertension, Adult High blood pressure (hypertension) is when the force of blood pumping through the arteries is too strong. The arteries are the blood vessels that carry blood from the heart throughout the body. Hypertension forces the heart to work harder to pump blood and may cause arteries to become narrow or stiff. Untreated or uncontrolled hypertension can lead to a heart attack, heart failure, a stroke, kidney disease, and other problems. A blood pressure reading consists of a higher number over a lower number. Ideally, your blood pressure should be below 120/80. The first ("top") number is called the systolic pressure. It is a measure of the pressure in your arteries as your heart beats. The second ("bottom") number is called the diastolic pressure. It is a measure of the pressure in your arteries as the heart relaxes. What are the causes? The exact cause of this condition is not known. There are some conditions that result in high blood pressure. What increases the risk? Certain factors may make you more likely to develop high blood pressure. Some of these risk factors are under your control, including: Smoking. Not getting enough exercise or physical activity. Being overweight. Having too much fat, sugar, calories, or salt (sodium) in your diet. Drinking too much alcohol. Other risk factors include: Having a personal history of heart disease, diabetes, high cholesterol, or kidney disease. Stress. Having a family history of high blood pressure and high cholesterol. Having obstructive sleep apnea. Age. The risk increases with age. What are the signs or symptoms? High blood pressure may not cause symptoms. Very high blood pressure (hypertensive crisis) may cause: Headache. Fast or irregular heartbeats (palpitations). Shortness of breath. Nosebleed. Nausea and vomiting. Vision changes. Severe chest pain, dizziness, and seizures. How is this diagnosed? This condition is diagnosed by  measuring your blood pressure while you are seated, with your arm resting on a flat surface, your legs uncrossed, and your feet flat on the floor. The cuff of the blood pressure monitor will be placed directly against the skin of your upper arm at the level of your heart. Blood pressure should be measured at least twice using the same arm. Certain conditions can cause a difference in blood pressure between your right and left arms. If you have a high blood pressure reading during one visit or you have normal blood pressure with other risk factors, you may be asked to: Return on a different day to have your blood pressure checked again. Monitor your blood pressure at home for 1 week or longer. If you are diagnosed with hypertension, you may have other blood or imaging tests to help your health care provider understand your overall risk for other conditions. How is this treated? This condition is treated by making healthy lifestyle changes, such as eating healthy foods, exercising more, and reducing your alcohol intake. You may be referred for counseling on a healthy diet and physical activity. Your health care provider may prescribe medicine if lifestyle changes are not enough to get your blood pressure under control and if: Your systolic blood pressure is above 130. Your diastolic blood pressure is above 80. Your personal target blood pressure may vary depending on your medical conditions, your age, and other factors. Follow these instructions at home: Eating and drinking  Eat a diet that is high in fiber and potassium, and low in sodium, added sugar, and fat. An example of this eating plan is called the DASH diet. DASH stands for Dietary Approaches to Stop Hypertension. To eat this way: Eat   plenty of fresh fruits and vegetables. Try to fill one half of your plate at each meal with fruits and vegetables. Eat whole grains, such as whole-wheat pasta, brown rice, or whole-grain bread. Fill about one  fourth of your plate with whole grains. Eat or drink low-fat dairy products, such as skim milk or low-fat yogurt. Avoid fatty cuts of meat, processed or cured meats, and poultry with skin. Fill about one fourth of your plate with lean proteins, such as fish, chicken without skin, beans, eggs, or tofu. Avoid pre-made and processed foods. These tend to be higher in sodium, added sugar, and fat. Reduce your daily sodium intake. Many people with hypertension should eat less than 1,500 mg of sodium a day. Do not drink alcohol if: Your health care provider tells you not to drink. You are pregnant, may be pregnant, or are planning to become pregnant. If you drink alcohol: Limit how much you have to: 0-1 drink a day for women. 0-2 drinks a day for men. Know how much alcohol is in your drink. In the [place] one drink equals one 12 oz bottle of beer (355 mL), one 5 oz glass of wine (148 mL), or one 1 oz glass of hard liquor (44 mL). Lifestyle  Work with your health care provider to maintain a healthy body weight or to lose weight. Ask what an ideal weight is for you. Get at least 30 minutes of exercise that causes your heart to beat faster (aerobic exercise) most days of the week. Activities may include walking, swimming, or biking. Include exercise to strengthen your muscles (resistance exercise), such as Pilates or lifting weights, as part of your weekly exercise routine. Try to do these types of exercises for 30 minutes at least 3 days a week. Do not use any products that contain nicotine or tobacco. These products include cigarettes, chewing tobacco, and vaping devices, such as e-cigarettes. If you need help quitting, ask your health care provider. Monitor your blood pressure at home as told by your health care provider. Keep all follow-up visits. This is important. Medicines Take over-the-counter and prescription medicines only as told by your health care provider. Follow directions carefully. Blood  pressure medicines must be taken as prescribed. Do not skip doses of blood pressure medicine. Doing this puts you at risk for problems and can make the medicine less effective. Ask your health care provider about side effects or reactions to medicines that you should watch for. Contact a health care provider if you: Think you are having a reaction to a medicine you are taking. Have headaches that keep coming back (recurring). Feel dizzy. Have swelling in your ankles. Have trouble with your vision. Get help right away if you: Develop a severe headache or confusion. Have unusual weakness or numbness. Feel faint. Have severe pain in your chest or abdomen. Vomit repeatedly. Have trouble breathing. These symptoms may be an emergency. Get help right away. Call 911. Do not wait to see if the symptoms will go away. Do not drive yourself to the hospital. Summary Hypertension is when the force of blood pumping through your arteries is too strong. If this condition is not controlled, it may put you at risk for serious complications. Your personal target blood pressure may vary depending on your medical conditions, your age, and other factors. For most people, a normal blood pressure is less than 120/80. Hypertension is treated with lifestyle changes, medicines, or a combination of both. Lifestyle changes include losing weight, eating a healthy,   low-sodium diet, exercising more, and limiting alcohol. This information is not intended to replace advice given to you by your health care provider. Make sure you discuss any questions you have with your health care provider. Document Revised: 03/19/2021 Document Reviewed: 03/19/2021 Elsevier Patient Education  2024 Elsevier Inc.  

## 2022-12-12 NOTE — Progress Notes (Signed)
Subjective:    Patient ID: Deborah Lowe, female    DOB: December 14, 1940, 82 y.o.   MRN: [id number]  Chief Complaint  Patient presents with   Hypertension   Pt presents to the office today to recheck HTN. She was seen on 10/17/22 and started on Norvasc 5 mg. Her BP is not at goal today. However, she brought her home readings for the last several weeks and it has been 128/52.    She was seen at Cardiologists every 3 months for A FIb, CAD, and HTN. Taking Eliquis 5 mg BID. Stable.    She is followed by GYN annually.    Has hx of left breast cancer and gets annual mammograms.  Reports her anxiety is greatly improved since starting Lexapro.  Hypertension This is a chronic problem. The current episode started more than 1 year ago. The problem has been waxing and waning since onset. The problem is controlled. Associated symptoms include anxiety. Pertinent negatives include no malaise/fatigue, peripheral edema or shortness of breath. Risk factors for coronary artery disease include dyslipidemia. The current treatment provides moderate improvement.  Anxiety Presents for follow-up visit. Symptoms include excessive worry, nervous/anxious behavior and restlessness. Patient reports no shortness of breath. Symptoms occur rarely. The severity of symptoms is mild.        Review of Systems  Constitutional:  Negative for malaise/fatigue.  Respiratory:  Negative for shortness of breath.   Psychiatric/Behavioral:  The patient is nervous/anxious.   All other systems reviewed and are negative.      Objective:   Physical Exam Vitals reviewed.  Constitutional:      General: She is not in acute distress.    Appearance: She is well-developed.  HENT:     Head: Normocephalic and atraumatic.     Right Ear: Tympanic membrane normal.     Left Ear: Tympanic membrane normal.  Eyes:     Pupils: Pupils are equal, round, and reactive to light.  Neck:     Thyroid: No thyromegaly.  Cardiovascular:      Rate and Rhythm: Normal rate and regular rhythm.     Heart sounds: Normal heart sounds. No murmur heard. Pulmonary:     Effort: Pulmonary effort is normal. No respiratory distress.     Breath sounds: Normal breath sounds. No wheezing.  Abdominal:     General: Bowel sounds are normal. There is no distension.     Palpations: Abdomen is soft.     Tenderness: There is no abdominal tenderness.  Musculoskeletal:        General: No tenderness. Normal range of motion.     Cervical back: Normal range of motion and neck supple.  Skin:    General: Skin is warm and dry.  Neurological:     Mental Status: She is alert and oriented to person, place, and time.     Cranial Nerves: No cranial nerve deficit.     Deep Tendon Reflexes: Reflexes are normal and symmetric.  Psychiatric:        Behavior: Behavior normal.        Thought Content: Thought content normal.        Judgment: Judgment normal.       BP (!) 128/52   Pulse (!) 52   Temp 97.9 F (36.6 C) (Temporal)   Ht 5\' 1"  (1.549 m)   Wt 112 lb (50.8 kg)   SpO2 97%   BMI 21.16 kg/m      Assessment & Plan:  Deborah Lowe  comes in today with chief complaint of Hypertension   Diagnosis and orders addressed:  1. Essential hypertension - CMP14+EGFR  2. Anxiety - CMP14+EGFR  3. Paroxysmal atrial fibrillation (HCC) - CMP14+EGFR  4. Coronary artery disease involving native coronary artery of native heart without angina pectoris - CMP14+EGFR   Labs pending Home BP reading at goal  She will bring home BP monitor at next to check how accurate  Health Maintenance reviewed Diet and exercise encouraged  Follow up plan: 4 months    Jannifer Rodney, FNP

## 2022-12-13 LAB — CMP14+EGFR
ALT: 16 IU/L (ref 0–32)
AST: 15 IU/L (ref 0–40)
Albumin: 4.3 g/dL (ref 3.8–4.8)
Alkaline Phosphatase: 96 IU/L (ref 44–121)
BUN/Creatinine Ratio: 18 (ref 12–28)
BUN: 13 mg/dL (ref 8–27)
Bilirubin Total: 0.8 mg/dL (ref 0.0–1.2)
CO2: 26 mmol/L (ref 20–29)
Calcium: 9.6 mg/dL (ref 8.7–10.3)
Chloride: 99 mmol/L (ref 96–106)
Creatinine, Ser: 0.71 mg/dL (ref 0.57–1.00)
Globulin, Total: 2.2 g/dL (ref 1.5–4.5)
Glucose: 91 mg/dL (ref 70–99)
Potassium: 4.8 mmol/L (ref 3.5–5.2)
Sodium: 138 mmol/L (ref 134–144)
Total Protein: 6.5 g/dL (ref 6.0–8.5)
eGFR: 86 mL/min/{1.73_m2} (ref >59)

## 2022-12-18 ENCOUNTER — Ambulatory Visit: Payer: Medicare Other | Admitting: Family

## 2022-12-30 ENCOUNTER — Ambulatory Visit: Payer: Medicare Other | Admitting: Family Medicine

## 2022-12-31 ENCOUNTER — Other Ambulatory Visit: Payer: Self-pay | Admitting: Physician Assistant

## 2022-12-31 ENCOUNTER — Other Ambulatory Visit: Payer: Self-pay | Admitting: Family

## 2022-12-31 DIAGNOSIS — F419 Anxiety disorder, unspecified: Secondary | ICD-10-CM

## 2023-01-02 ENCOUNTER — Ambulatory Visit (INDEPENDENT_AMBULATORY_CARE_PROVIDER_SITE_OTHER): Payer: Medicare Other | Admitting: Family Medicine

## 2023-01-02 ENCOUNTER — Encounter: Payer: Self-pay | Admitting: Family Medicine

## 2023-01-02 VITALS — BP 163/75 | HR 63 | Temp 97.0°F | Resp 20 | Ht 61.0 in | Wt 116.0 lb

## 2023-01-02 DIAGNOSIS — N3001 Acute cystitis with hematuria: Secondary | ICD-10-CM | POA: Diagnosis not present

## 2023-01-02 DIAGNOSIS — R300 Dysuria: Secondary | ICD-10-CM | POA: Diagnosis not present

## 2023-01-02 LAB — MICROSCOPIC EXAMINATION
Renal Epithel, UA: NONE SEEN /hpf
Yeast, UA: NONE SEEN

## 2023-01-02 LAB — URINALYSIS, ROUTINE W REFLEX MICROSCOPIC
Bilirubin, UA: NEGATIVE
Glucose, UA: NEGATIVE
Ketones, UA: NEGATIVE
Nitrite, UA: NEGATIVE
Protein,UA: NEGATIVE
Specific Gravity, UA: 1.015 (ref 1.005–1.030)
Urobilinogen, Ur: 0.2 mg/dL (ref 0.2–1.0)
pH, UA: 5 (ref 5.0–7.5)

## 2023-01-02 MED ORDER — CEPHALEXIN 500 MG PO CAPS
500.0000 mg | ORAL_CAPSULE | Freq: Two times a day (BID) | ORAL | 0 refills | Status: DC
Start: 2023-01-02 — End: 2023-04-14

## 2023-01-02 NOTE — Progress Notes (Signed)
Subjective:  Patient ID: Deborah Lowe, female    DOB: 1941-05-23, 82 y.o.   MRN: [id number]  Patient Care Team: Junie Spencer, FNP as PCP - General (Family Medicine) Nahser, Deloris Ping, MD as PCP - Cardiology (Cardiology) Richarda Overlie, MD as Consulting Physician (Obstetrics and Gynecology) Adam Phenix, DPM as Consulting Physician (Podiatry) Cherly Beach, MD as Consulting Physician (Dermatology) Axel Filler, Larna Daughters, NP as Nurse Practitioner (Hematology and Oncology) Kennon Rounds as Physician Assistant (Cardiology)   Chief Complaint:  Dysuria and Urinary Tract Infection   HPI: [name] is a 82 y.o. female presenting on 01/02/2023 for Dysuria and Urinary Tract Infection   Dysuria  This is a new problem. Episode onset: 3 days ago. The problem occurs every urination. The problem has been gradually worsening. The quality of the pain is described as aching and burning. The pain is mild. There has been no fever. She is Not sexually active. There is No history of pyelonephritis. Associated symptoms include frequency and urgency. Pertinent negatives include no chills, discharge, flank pain, hematuria, hesitancy, nausea, possible pregnancy, sweats or vomiting. She has tried increased fluids for the symptoms. The treatment provided no relief.       Relevant past medical, surgical, family, and social history reviewed and updated as indicated.  Allergies and medications reviewed and updated. Data reviewed: Chart in Epic.   Past Medical History:  Diagnosis Date   Breast cancer (HCC) 05/26/2010   s/p lumpectomy of left breast with XRT   Hypertension    Palpitations    on beta blocker therapy   Paroxysmal atrial fibrillation (HCC) 08/12/2022   TTE 09/10/22: EF 60-65, no RWMA, NL RVSF, NL PASP, RVSP 30.9, mild LAE, trivial MR, trivial AI, RAP 3   Ringing in ears    Wears glasses     Past Surgical History:  Procedure Laterality Date   BREAST LUMPECTOMY Left     BREAST SURGERY     CARDIAC CATHETERIZATION  01/29/2007   CARDIOVASCULAR STRESS TEST  07/19/2002   EF 84%   CHOLECYSTECTOMY     TEAR DUCT PROBING      Social History   Socioeconomic History   Marital status: Married    Spouse name: [name]   Number of children: 1   Years of education: 12   Highest education level: Not on file  Occupational History   Occupation: retired  Tobacco Use   Smoking status: Never   Smokeless tobacco: Never  Vaping Use   Vaping status: Never Used  Substance and Sexual Activity   Alcohol use: No   Drug use: No   Sexual activity: Yes    Birth control/protection: Post-menopausal  Other Topics Concern   Not on file  Social History Narrative   One daughter   Social Determinants of Health   Financial Resource Strain: Low Risk  (07/30/2022)   Overall Financial Resource Strain (CARDIA)    Difficulty of Paying Living Expenses: Not hard at all  Food Insecurity: No Food Insecurity (07/30/2022)   Hunger Vital Sign    Worried About Running Out of Food in the Last Year: Never true    Ran Out of Food in the Last Year: Never true  Transportation Needs: No Transportation Needs (07/30/2022)   PRAPARE - Administrator, Civil Service (Medical): No    Lack of Transportation (Non-Medical): No  Physical Activity: Insufficiently Active (07/30/2022)   Exercise Vital Sign    Days of Exercise per  Week: 3 days    Minutes of Exercise per Session: 30 min  Stress: No Stress Concern Present (07/30/2022)   [name] of Occupational Health - Occupational Stress Questionnaire    Feeling of Stress : Not at all  Social Connections: Moderately Isolated (07/30/2022)   Social Connection and Isolation Panel [NHANES]    Frequency of Communication with Friends and Family: More than three times a week    Frequency of Social Gatherings with Friends and Family: More than three times a week    Attends Religious Services: Never    Database administrator or Organizations: No     Attends Banker Meetings: Never    Marital Status: Married  Catering manager Violence: Not At Risk (07/30/2022)   Humiliation, Afraid, Rape, and Kick questionnaire    Fear of Current or Ex-Partner: No    Emotionally Abused: No    Physically Abused: No    Sexually Abused: No    Outpatient Encounter Medications as of 01/02/2023  Medication Sig   amLODipine (NORVASC) 5 MG tablet Take 1 tablet (5 mg total) by mouth daily.   apixaban (ELIQUIS) 5 MG TABS tablet Take 1 tablet (5 mg total) by mouth 2 (two) times daily.   calcium-vitamin D (OSCAL WITH D) 250-125 MG-UNIT tablet Take 1 tablet by mouth daily.   cephALEXin (KEFLEX) 500 MG capsule Take 1 capsule (500 mg total) by mouth 2 (two) times daily.   doxazosin (CARDURA) 2 MG tablet Take 2 tablets (4 mg total) by mouth daily.   escitalopram (LEXAPRO) 5 MG tablet TAKE 1 TABLET (5 MG TOTAL) BY MOUTH DAILY.   furosemide (LASIX) 20 MG tablet Take 1 tablet (20 mg total) by mouth daily.   hydrALAZINE (APRESOLINE) 25 MG tablet TAKE ONE (1) TABLET BY MOUTH AS NEEDED FOR > 170 X 2 , 20 MINUTES APART.   KLOR-CON M10 10 MEQ tablet TAKE ONE (1) TABLET (10 MEQ) EVERY DAY AS NEEDED FOR SWELLING WITH LASIX.   metoprolol succinate (TOPROL XL) 25 MG 24 hr tablet Take 1 tablet (25 mg total) by mouth 2 (two) times daily.   valsartan (DIOVAN) 160 MG tablet Take 1 tablet (160 mg total) by mouth daily.   No facility-administered encounter medications on file as of 01/02/2023.    Allergies  Allergen Reactions   Avelox [Moxifloxacin Hcl In Nacl] Anaphylaxis    Review of Systems  Constitutional:  Negative for activity change, appetite change, chills, diaphoresis, fatigue, fever and unexpected weight change.  Cardiovascular:  Negative for chest pain and leg swelling.  Gastrointestinal:  Negative for abdominal pain, nausea and vomiting.  Genitourinary:  Positive for dysuria, frequency and urgency. Negative for decreased urine volume, difficulty urinating,  dyspareunia, enuresis, flank pain, genital sores, hematuria, hesitancy, pelvic pain, vaginal bleeding, vaginal discharge and vaginal pain.  Musculoskeletal:  Negative for back pain.  Neurological:  Negative for weakness.  Psychiatric/Behavioral:  Negative for confusion.   All other systems reviewed and are negative.       Objective:  BP (!) 163/75   Pulse 63   Temp (!) 97 F (36.1 C) (Oral)   Resp 20   Ht 5\' 1"  (1.549 m)   Wt 116 lb (52.6 kg)   SpO2 97%   BMI 21.92 kg/m    Wt Readings from Last 3 Encounters:  01/02/23 116 lb (52.6 kg)  12/12/22 112 lb (50.8 kg)  12/09/22 115 lb 6.4 oz (52.3 kg)    Physical Exam Vitals and nursing note reviewed.  Constitutional:      General: She is not in acute distress.    Appearance: Normal appearance. She is not ill-appearing, toxic-appearing or diaphoretic.  HENT:     Head: Normocephalic and atraumatic.     Mouth/Throat:     Mouth: Mucous membranes are moist.  Eyes:     Pupils: Pupils are equal, round, and reactive to light.  Cardiovascular:     Rate and Rhythm: Normal rate and regular rhythm.     Heart sounds: Normal heart sounds.  Pulmonary:     Effort: Pulmonary effort is normal.     Breath sounds: Normal breath sounds.  Abdominal:     General: Bowel sounds are normal.     Palpations: Abdomen is soft.     Tenderness: There is no abdominal tenderness. There is no right CVA tenderness or left CVA tenderness.  Musculoskeletal:     Right lower leg: No edema.     Left lower leg: No edema.  Skin:    General: Skin is warm and dry.     Capillary Refill: Capillary refill takes less than 2 seconds.  Neurological:     General: No focal deficit present.     Mental Status: She is alert and oriented to person, place, and time.  Psychiatric:        Mood and Affect: Mood normal.        Behavior: Behavior normal.        Thought Content: Thought content normal.        Judgment: Judgment normal.     Results for orders placed or  performed in visit on 12/12/22  CMP14+EGFR  Result Value Ref Range   Glucose 91 70 - 99 mg/dL   BUN 13 8 - 27 mg/dL   Creatinine, Ser 6.01 0.57 - 1.00 mg/dL   eGFR 86 >09 NA/TFT/7.32   BUN/Creatinine Ratio 18 12 - 28   Sodium 138 134 - 144 mmol/L   Potassium 4.8 3.5 - 5.2 mmol/L   Chloride 99 96 - 106 mmol/L   CO2 26 20 - 29 mmol/L   Calcium 9.6 8.7 - 10.3 mg/dL   Total Protein 6.5 6.0 - 8.5 g/dL   Albumin 4.3 3.8 - 4.8 g/dL   Globulin, Total 2.2 1.5 - 4.5 g/dL   Bilirubin Total 0.8 0.0 - 1.2 mg/dL   Alkaline Phosphatase 96 44 - 121 IU/L   AST 15 0 - 40 IU/L   ALT 16 0 - 32 IU/L       Pertinent labs & imaging results that were available during my care of the patient were reviewed by me and considered in my medical decision making.  Assessment & Plan:  Andreah was seen today for dysuria and urinary tract infection.  Diagnoses and all orders for this visit:  Dysuria Urinalysis with leukocytes and blood. Culture pending.  -     Urinalysis, Routine w reflex microscopic -     Urine Culture  Acute cystitis with hematuria Start below. Culture pending, will change therapy if warranted. No red flags concerning for acute pyelonephritis. Increase water intake and avoid bladder irritants such as caffeine. Report new, worsening, or persistent symptoms.  -     cephALEXin (KEFLEX) 500 MG capsule; Take 1 capsule (500 mg total) by mouth 2 (two) times daily.     Continue all other maintenance medications.  Follow up plan: Return if symptoms worsen or fail to improve.   Continue healthy lifestyle choices, including diet (rich in fruits, vegetables, and  lean proteins, and low in salt and simple carbohydrates) and exercise (at least 30 minutes of moderate physical activity daily).  Educational handout given for UTI  The above assessment and management plan was discussed with the patient. The patient verbalized understanding of and has agreed to the management plan. Patient is aware to  call the clinic if they develop any new symptoms or if symptoms persist or worsen. Patient is aware when to return to the clinic for a follow-up visit. Patient educated on when it is appropriate to go to the emergency department.   Kari Baars, FNP-C [facility] [phone]

## 2023-01-08 DIAGNOSIS — L638 Other alopecia areata: Secondary | ICD-10-CM | POA: Diagnosis not present

## 2023-01-08 DIAGNOSIS — L821 Other seborrheic keratosis: Secondary | ICD-10-CM | POA: Diagnosis not present

## 2023-01-08 DIAGNOSIS — L578 Other skin changes due to chronic exposure to nonionizing radiation: Secondary | ICD-10-CM | POA: Diagnosis not present

## 2023-01-12 NOTE — Addendum Note (Signed)
Addended by: Angela Adam on: 01/12/2023 08:18 AM   Modules accepted: Orders

## 2023-01-21 ENCOUNTER — Institutional Professional Consult (permissible substitution) (HOSPITAL_BASED_OUTPATIENT_CLINIC_OR_DEPARTMENT_OTHER): Payer: Medicare Other | Admitting: Cardiovascular Disease

## 2023-01-27 ENCOUNTER — Other Ambulatory Visit: Payer: Medicare Other

## 2023-01-27 DIAGNOSIS — R300 Dysuria: Secondary | ICD-10-CM | POA: Diagnosis not present

## 2023-01-29 LAB — URINE CULTURE: Organism ID, Bacteria: NO GROWTH

## 2023-02-16 DIAGNOSIS — Z23 Encounter for immunization: Secondary | ICD-10-CM | POA: Diagnosis not present

## 2023-02-21 ENCOUNTER — Other Ambulatory Visit: Payer: Self-pay | Admitting: Physician Assistant

## 2023-02-24 MED ORDER — METOPROLOL SUCCINATE ER 25 MG PO TB24: 25.0000 mg | ORAL_TABLET | Freq: Two times a day (BID) | ORAL | 2 refills | Status: DC

## 2023-03-03 DIAGNOSIS — Z23 Encounter for immunization: Secondary | ICD-10-CM | POA: Diagnosis not present

## 2023-03-16 DIAGNOSIS — Z23 Encounter for immunization: Secondary | ICD-10-CM | POA: Diagnosis not present

## 2023-03-23 DIAGNOSIS — Z961 Presence of intraocular lens: Secondary | ICD-10-CM | POA: Diagnosis not present

## 2023-04-14 ENCOUNTER — Ambulatory Visit (INDEPENDENT_AMBULATORY_CARE_PROVIDER_SITE_OTHER): Payer: Medicare Other | Admitting: Family

## 2023-04-14 ENCOUNTER — Encounter: Payer: Self-pay | Admitting: Family

## 2023-04-14 VITALS — BP 130/55 | HR 57 | Temp 97.7°F | Ht 61.0 in | Wt 116.0 lb

## 2023-04-14 DIAGNOSIS — F419 Anxiety disorder, unspecified: Secondary | ICD-10-CM

## 2023-04-14 DIAGNOSIS — Z853 Personal history of malignant neoplasm of breast: Secondary | ICD-10-CM | POA: Diagnosis not present

## 2023-04-14 DIAGNOSIS — I10 Essential (primary) hypertension: Secondary | ICD-10-CM

## 2023-04-14 DIAGNOSIS — I480 Paroxysmal atrial fibrillation: Secondary | ICD-10-CM | POA: Diagnosis not present

## 2023-04-14 DIAGNOSIS — I2510 Atherosclerotic heart disease of native coronary artery without angina pectoris: Secondary | ICD-10-CM

## 2023-04-14 DIAGNOSIS — K219 Gastro-esophageal reflux disease without esophagitis: Secondary | ICD-10-CM | POA: Diagnosis not present

## 2023-04-14 DIAGNOSIS — E7800 Pure hypercholesterolemia, unspecified: Secondary | ICD-10-CM

## 2023-04-14 MED ORDER — OMEPRAZOLE 20 MG PO CPDR
20.0000 mg | DELAYED_RELEASE_CAPSULE | Freq: Every day | ORAL | 3 refills | Status: DC
Start: 2023-04-14 — End: 2023-06-10

## 2023-04-14 NOTE — Progress Notes (Signed)
Subjective:    Patient ID: Deborah Lowe, female    DOB: November 15, 1940, 82 y.o.   MRN: [id number]  Chief Complaint  Patient presents with   Medical Management of Chronic Issues   Gastroesophageal Reflux    Otc omeprazol helps but still there. Wants a RX   PT presents to the office for chronic follow up.  She was seen at Cardiologists every 3 months for A FIb, CAD, and HTN. Taking Eliquis 5 mg BID. Stable. Her BP is elevated today, but brings in home log and her BP has been 130/55.   She is followed by GYN annually.    Has hx of left breast cancer and gets annual mammograms. Gastroesophageal Reflux She complains of belching, heartburn and a hoarse voice. This is a chronic problem. The current episode started more than 1 year ago. The problem occurs occasionally. She has tried a PPI for the symptoms. The treatment provided moderate relief.  Hypertension This is a chronic problem. The current episode started more than 1 year ago. The problem has been waxing and waning since onset. The problem is controlled. Associated symptoms include anxiety and malaise/fatigue. Pertinent negatives include no peripheral edema or shortness of breath. Risk factors for coronary artery disease include dyslipidemia and sedentary lifestyle. The current treatment provides moderate improvement.  Anxiety Presents for follow-up visit. Symptoms include depressed mood, excessive worry and nervous/anxious behavior. Patient reports no shortness of breath. Symptoms occur occasionally. The severity of symptoms is mild.        Review of Systems  Constitutional:  Positive for malaise/fatigue.  HENT:  Positive for hoarse voice.   Respiratory:  Negative for shortness of breath.   Gastrointestinal:  Positive for heartburn.  Psychiatric/Behavioral:  The patient is nervous/anxious.   All other systems reviewed and are negative.      Objective:   Physical Exam Vitals reviewed.  Constitutional:      General: She is  not in acute distress.    Appearance: She is well-developed.  HENT:     Head: Normocephalic and atraumatic.     Right Ear: Tympanic membrane normal.     Left Ear: Tympanic membrane normal.  Eyes:     Pupils: Pupils are equal, round, and reactive to light.  Neck:     Thyroid: No thyromegaly.  Cardiovascular:     Rate and Rhythm: Normal rate and regular rhythm.     Heart sounds: Normal heart sounds. No murmur heard. Pulmonary:     Effort: Pulmonary effort is normal. No respiratory distress.     Breath sounds: Normal breath sounds. No wheezing.  Abdominal:     General: Bowel sounds are normal. There is no distension.     Palpations: Abdomen is soft.     Tenderness: There is no abdominal tenderness.  Musculoskeletal:        General: No tenderness. Normal range of motion.     Cervical back: Normal range of motion and neck supple.  Skin:    General: Skin is warm and dry.  Neurological:     Mental Status: She is alert and oriented to person, place, and time.     Cranial Nerves: No cranial nerve deficit.     Deep Tendon Reflexes: Reflexes are normal and symmetric.  Psychiatric:        Behavior: Behavior normal.        Thought Content: Thought content normal.        Judgment: Judgment normal.  BP (!) 182/71   Pulse (!) 57   Temp 97.7 F (36.5 C) (Temporal)   Ht 5\' 1"  (1.549 m)   Wt 116 lb (52.6 kg)   SpO2 98%   BMI 21.92 kg/m   Assessment & Plan:   NAJMO DEPERALTA comes in today with chief complaint of Medical Management of Chronic Issues and Gastroesophageal Reflux (Otc omeprazol helps but still there. Wants a RX)   Diagnosis and orders addressed:  1. Essential hypertension - CMP14+EGFR - CBC with Differential/Platelet  2. Anxiety - CMP14+EGFR - CBC with Differential/Platelet  3. Paroxysmal atrial fibrillation (HCC) - CMP14+EGFR - CBC with Differential/Platelet  4. Coronary artery disease involving native coronary artery of native heart without angina  pectoris - CMP14+EGFR - CBC with Differential/Platelet  5. Pure hypercholesterolemia - CMP14+EGFR - CBC with Differential/Platelet - Lipid panel  6. History of left breast cancer - CMP14+EGFR - CBC with Differential/Platelet  7. Gastroesophageal reflux disease without esophagitis -Diet discussed- Avoid fried, spicy, citrus foods, caffeine and alcohol -Do not eat 2-3 hours before bedtime -Encouraged small frequent meals -Avoid NSAID's -Follow up if symptoms worsen or do not improve  - omeprazole (PRILOSEC) 20 MG capsule; Take 1 capsule (20 mg total) by mouth daily.  Dispense: 90 capsule; Refill: 3   Labs pending Does not need to take Lasix, only as needed. Take potassium when taking lasix.  Health Maintenance reviewed Diet and exercise encouraged  Follow up plan: 6 months   Jannifer Rodney, FNP

## 2023-04-14 NOTE — Patient Instructions (Signed)
Health Maintenance After Age 80 After age 80, you are at a higher risk for certain long-term diseases and infections as well as injuries from falls. Falls are a major cause of broken bones and head injuries in people who are older than age 80. Getting regular preventive care can help to keep you healthy and well. Preventive care includes getting regular testing and making lifestyle changes as recommended by your health care provider. Talk with your health care provider about: Which screenings and tests you should have. A screening is a test that checks for a disease when you have no symptoms. A diet and exercise plan that is right for you. What should I know about screenings and tests to prevent falls? Screening and testing are the best ways to find a health problem early. Early diagnosis and treatment give you the best chance of managing medical conditions that are common after age 80. Certain conditions and lifestyle choices may make you more likely to have a fall. Your health care provider may recommend: Regular vision checks. Poor vision and conditions such as cataracts can make you more likely to have a fall. If you wear glasses, make sure to get your prescription updated if your vision changes. Medicine review. Work with your health care provider to regularly review all of the medicines you are taking, including over-the-counter medicines. Ask your health care provider about any side effects that may make you more likely to have a fall. Tell your health care provider if any medicines that you take make you feel dizzy or sleepy. Strength and balance checks. Your health care provider may recommend certain tests to check your strength and balance while standing, walking, or changing positions. Foot health exam. Foot pain and numbness, as well as not wearing proper footwear, can make you more likely to have a fall. Screenings, including: Osteoporosis screening. Osteoporosis is a condition that causes  the bones to get weaker and break more easily. Blood pressure screening. Blood pressure changes and medicines to control blood pressure can make you feel dizzy. Depression screening. You may be more likely to have a fall if you have a fear of falling, feel depressed, or feel unable to do activities that you used to do. Alcohol use screening. Using too much alcohol can affect your balance and may make you more likely to have a fall. Follow these instructions at home: Lifestyle Do not drink alcohol if: Your health care provider tells you not to drink. If you drink alcohol: Limit how much you have to: 0-1 drink a day for women. 0-2 drinks a day for men. Know how much alcohol is in your drink. In the [place] one drink equals one 12 oz bottle of beer (355 mL), one 5 oz glass of wine (148 mL), or one 1 oz glass of hard liquor (44 mL). Do not use any products that contain nicotine or tobacco. These products include cigarettes, chewing tobacco, and vaping devices, such as e-cigarettes. If you need help quitting, ask your health care provider. Activity  Follow a regular exercise program to stay fit. This will help you maintain your balance. Ask your health care provider what types of exercise are appropriate for you. If you need a cane or walker, use it as recommended by your health care provider. Wear supportive shoes that have nonskid soles. Safety  Remove any tripping hazards, such as rugs, cords, and clutter. Install safety equipment such as grab bars in bathrooms and safety rails on stairs. Keep rooms and walkways   well-lit. General instructions Talk with your health care provider about your risks for falling. Tell your health care provider if: You fall. Be sure to tell your health care provider about all falls, even ones that seem minor. You feel dizzy, tiredness (fatigue), or off-balance. Take over-the-counter and prescription medicines only as told by your health care provider. These include  supplements. Eat a healthy diet and maintain a healthy weight. A healthy diet includes low-fat dairy products, low-fat (lean) meats, and fiber from whole grains, beans, and lots of fruits and vegetables. Stay current with your vaccines. Schedule regular health, dental, and eye exams. Summary Having a healthy lifestyle and getting preventive care can help to protect your health and wellness after age 80. Screening and testing are the best way to find a health problem early and help you avoid having a fall. Early diagnosis and treatment give you the best chance for managing medical conditions that are more common for people who are older than age 80. Falls are a major cause of broken bones and head injuries in people who are older than age 80. Take precautions to prevent a fall at home. Work with your health care provider to learn what changes you can make to improve your health and wellness and to prevent falls. This information is not intended to replace advice given to you by your health care provider. Make sure you discuss any questions you have with your health care provider. Document Revised: 10/01/2020 Document Reviewed: 10/01/2020 Elsevier Patient Education  2024 Elsevier Inc.  

## 2023-04-15 LAB — LIPID PANEL
Chol/HDL Ratio: 3 ratio (ref 0.0–4.4)
Cholesterol, Total: 190 mg/dL (ref 100–199)
HDL: 63 mg/dL (ref >39)
LDL Chol Calc (NIH): 114 mg/dL — ABNORMAL HIGH (ref 0–99)
Triglycerides: 73 mg/dL (ref 0–149)
VLDL Cholesterol Cal: 13 mg/dL (ref 5–40)

## 2023-04-15 LAB — CBC WITH DIFFERENTIAL/PLATELET
Basophils Absolute: 0 10*3/uL (ref 0.0–0.2)
Basos: 1 %
EOS (ABSOLUTE): 0.1 10*3/uL (ref 0.0–0.4)
Eos: 2 %
Hematocrit: 39.9 % (ref 34.0–46.6)
Hemoglobin: 12.7 g/dL (ref 11.1–15.9)
Immature Grans (Abs): 0 10*3/uL (ref 0.0–0.1)
Immature Granulocytes: 0 %
Lymphocytes Absolute: 1.6 10*3/uL (ref 0.7–3.1)
Lymphs: 30 %
MCH: 29 pg (ref 26.6–33.0)
MCHC: 31.8 g/dL (ref 31.5–35.7)
MCV: 91 fL (ref 79–97)
Monocytes Absolute: 0.4 10*3/uL (ref 0.1–0.9)
Monocytes: 8 %
Neutrophils Absolute: 3.1 10*3/uL (ref 1.4–7.0)
Neutrophils: 59 %
Platelets: 245 10*3/uL (ref 150–450)
RBC: 4.38 x10E6/uL (ref 3.77–5.28)
RDW: 12.2 % (ref 11.7–15.4)
WBC: 5.3 10*3/uL (ref 3.4–10.8)

## 2023-04-15 LAB — CMP14+EGFR
ALT: 13 [IU]/L (ref 0–32)
AST: 13 [IU]/L (ref 0–40)
Albumin: 4.2 g/dL (ref 3.8–4.8)
Alkaline Phosphatase: 95 [IU]/L (ref 44–121)
BUN/Creatinine Ratio: 13 (ref 12–28)
BUN: 10 mg/dL (ref 8–27)
Bilirubin Total: 1 mg/dL (ref 0.0–1.2)
CO2: 26 mmol/L (ref 20–29)
Calcium: 9.4 mg/dL (ref 8.7–10.3)
Chloride: 98 mmol/L (ref 96–106)
Creatinine, Ser: 0.76 mg/dL (ref 0.57–1.00)
Globulin, Total: 2.1 g/dL (ref 1.5–4.5)
Glucose: 90 mg/dL (ref 70–99)
Potassium: 4.8 mmol/L (ref 3.5–5.2)
Sodium: 136 mmol/L (ref 134–144)
Total Protein: 6.3 g/dL (ref 6.0–8.5)
eGFR: 79 mL/min/{1.73_m2} (ref >59)

## 2023-04-27 ENCOUNTER — Encounter: Payer: Self-pay | Admitting: Nurse Practitioner

## 2023-04-27 ENCOUNTER — Ambulatory Visit (INDEPENDENT_AMBULATORY_CARE_PROVIDER_SITE_OTHER): Payer: Medicare Other | Admitting: Nurse Practitioner

## 2023-04-27 VITALS — BP 138/67 | HR 72 | Temp 97.4°F | Ht 61.0 in | Wt 119.0 lb

## 2023-04-27 DIAGNOSIS — N3000 Acute cystitis without hematuria: Secondary | ICD-10-CM | POA: Insufficient documentation

## 2023-04-27 DIAGNOSIS — R300 Dysuria: Secondary | ICD-10-CM | POA: Diagnosis not present

## 2023-04-27 LAB — URINALYSIS, ROUTINE W REFLEX MICROSCOPIC
Bilirubin, UA: NEGATIVE
Ketones, UA: NEGATIVE
Nitrite, UA: POSITIVE — AB
Protein,UA: NEGATIVE
Specific Gravity, UA: 1.01 (ref 1.005–1.030)
Urobilinogen, Ur: 2 mg/dL — ABNORMAL HIGH (ref 0.2–1.0)
pH, UA: 6.5 (ref 5.0–7.5)

## 2023-04-27 LAB — MICROSCOPIC EXAMINATION
Epithelial Cells (non renal): NONE SEEN /[HPF] (ref 0–10)
RBC, Urine: >30 /[HPF] — AB (ref 0–2)
Renal Epithel, UA: NONE SEEN /[HPF]
WBC, UA: >30 /[HPF] — AB (ref 0–5)
Yeast, UA: NONE SEEN

## 2023-04-27 MED ORDER — DOXYCYCLINE HYCLATE 100 MG PO CAPS
100.0000 mg | ORAL_CAPSULE | Freq: Two times a day (BID) | ORAL | 0 refills | Status: DC
Start: 2023-04-27 — End: 2023-06-10

## 2023-04-27 NOTE — Progress Notes (Signed)
Acute Office Visit  Subjective:     Patient ID: Deborah Lowe, female    DOB: 1941/01/08, 82 y.o.   MRN: [id number]  Chief Complaint  Patient presents with   Dysuria    Symptoms started last Thursday has been taking azo    HPI  Deborah Lowe is a 82 y.o. female 04/26/23 for an acute visit c/o of urinary frequency, urgency and dysuria x 4 days, without flank pain, fever, chills, or abnormal vaginal discharge or bleeding.   Active Ambulatory Problems    Diagnosis Date Noted   Essential hypertension 08/13/2011   History of left breast cancer 05/28/2021   Punctal stenosis, acquired 10/13/2013   Osteoporosis 05/28/2021   Pseudophakia of both eyes 09/05/2014   Thyroid mass 07/30/2015   Pure hypercholesterolemia 01/17/2022   Other fatigue 01/17/2022   Anxiety 05/13/2022   Paroxysmal atrial fibrillation (HCC) 08/12/2022   Precordial chest pain 08/12/2022   CAD (coronary artery disease) 10/10/2022   Pleural effusion 10/10/2022   Bilateral lower extremity edema 10/10/2022   Dysuria 04/27/2023   Acute cystitis without hematuria 04/27/2023   Resolved Ambulatory Problems    Diagnosis Date Noted   Carcinoma of upper-outer quadrant of left female breast (HCC) 02/20/2011   Headache 03/07/2013   Palpitations 01/16/2021   Cerumen impaction 07/30/2015   Tearing 10/13/2013   Elevated TSH 08/12/2022   Past Medical History:  Diagnosis Date   Breast cancer (HCC) 05/26/2010   Hypertension    Ringing in ears    Wears glasses     ROS Negative unless indicated in HPI    Objective:    BP 138/67   Pulse 72   Temp (!) 97.4 F (36.3 C) (Temporal)   Ht 5\' 1"  (1.549 m)   Wt 119 lb (54 kg)   SpO2 95%   BMI 22.48 kg/m  BP Readings from Last 3 Encounters:  04/27/23 138/67  04/14/23 (!) 130/55  01/02/23 (!) 163/75   Wt Readings from Last 3 Encounters:  04/27/23 119 lb (54 kg)  04/14/23 116 lb (52.6 kg)  01/02/23 116 lb (52.6 kg)      Physical Exam  Appears well, in no  apparent distress.  Vital signs are normal. The abdomen is soft without tenderness, guarding, mass, rebound or organomegaly. No CVA tenderness or inguinal adenopathy noted. Urine dipstick shows positive for RBC's, positive for glucose, positive for +2 leukocytes, positive for urobilinogen, and Nit positive.   Micro exam:  >30 WBC's per HPF, >30 RBC's per HPF, and many+ bacteria.    No results found for any visits on 04/27/23.      Assessment & Plan:  Dysuria -     Urinalysis, Routine w reflex microscopic -     Urine Culture -     Doxycycline Hyclate; Take 1 capsule (100 mg total) by mouth 2 (two) times daily.  Dispense: 14 capsule; Refill: 0  Acute cystitis without hematuria -     Urine Culture -     Doxycycline Hyclate; Take 1 capsule (100 mg total) by mouth 2 (two) times daily.  Dispense: 14 capsule; Refill: 0   Deborah Lowe is an 82 year old Caucasian female, no acute distress Acute cystitis: Treat empirically with doxycycline 1 tab twice daily for 7 days while waiting for culture result  also push fluids, may use Pyridium OTC prn.  Call or return to clinic prn if these symptoms worsen or fail to improve as anticipated.      The above assessment and management  plan was discussed with the patient. The patient verbalized understanding of and has agreed to the management plan. Patient is aware to call the clinic if they develop any new symptoms or if symptoms persist or worsen. Patient is aware when to return to the clinic for a follow-up visit. Patient educated on when it is appropriate to go to the emergency department.  Return if symptoms worsen or fail to improve.  Arrie Aran Santa Lighter, [facility] [address] [place], [place] [address] [phone]  Note: This document was prepared by [employer] voice dictation technology and any errors that results from this process are unintentional.

## 2023-05-01 LAB — URINE CULTURE

## 2023-05-04 ENCOUNTER — Other Ambulatory Visit: Payer: Self-pay | Admitting: Nurse Practitioner

## 2023-05-04 DIAGNOSIS — N3000 Acute cystitis without hematuria: Secondary | ICD-10-CM

## 2023-05-04 DIAGNOSIS — R300 Dysuria: Secondary | ICD-10-CM

## 2023-05-04 MED ORDER — NITROFURANTOIN MONOHYD MACRO 100 MG PO CAPS: 100.0000 mg | ORAL_CAPSULE | Freq: Two times a day (BID) | ORAL | 0 refills | Status: AC

## 2023-05-04 NOTE — Progress Notes (Signed)
Urine culture positive Escherichia coli, which is resistant to previously prescribed Doxy, a new script for Microbid will be sent to her pharmacy Stop Doxy and start taking the new ATB

## 2023-05-13 ENCOUNTER — Telehealth: Payer: Self-pay | Admitting: Adult Health

## 2023-06-08 ENCOUNTER — Encounter: Payer: Medicare Other | Admitting: Adult Health

## 2023-06-10 ENCOUNTER — Encounter: Payer: Self-pay | Admitting: Physician Assistant

## 2023-06-10 ENCOUNTER — Ambulatory Visit: Payer: Medicare Other | Attending: Physician Assistant | Admitting: Physician Assistant

## 2023-06-10 VITALS — BP 156/60 | HR 80 | Ht 61.0 in | Wt 115.8 lb

## 2023-06-10 DIAGNOSIS — I10 Essential (primary) hypertension: Secondary | ICD-10-CM | POA: Insufficient documentation

## 2023-06-10 DIAGNOSIS — I2510 Atherosclerotic heart disease of native coronary artery without angina pectoris: Secondary | ICD-10-CM | POA: Insufficient documentation

## 2023-06-10 DIAGNOSIS — R030 Elevated blood-pressure reading, without diagnosis of hypertension: Secondary | ICD-10-CM | POA: Diagnosis not present

## 2023-06-10 DIAGNOSIS — I480 Paroxysmal atrial fibrillation: Secondary | ICD-10-CM | POA: Diagnosis not present

## 2023-06-10 MED ORDER — APIXABAN 2.5 MG PO TABS
2.5000 mg | ORAL_TABLET | Freq: Two times a day (BID) | ORAL | 11 refills | Status: DC
Start: 2023-06-10 — End: 2023-12-09

## 2023-06-10 NOTE — Assessment & Plan Note (Signed)
 Mild nonobstructive CAD on coronary CTA March 2024.  She is doing well without chest discomfort to suggest angina.  She does not need antiplatelet therapy as she is on Eliquis .  We discussed the benefits of statin therapy to reduce overall risk.  She would like to consider this.  Recent LDL was 114.  Goal should be less than 70.

## 2023-06-10 NOTE — Patient Instructions (Signed)
 Medication Instructions:  Decrease Eliquis  to 2.5 mg twice a day   *If you need a refill on your cardiac medications before your next appointment, please call your pharmacy*   Lab Work: None ordered   If you have labs (blood work) drawn today and your tests are completely normal, you will receive your results only by: MyChart Message (if you have MyChart) OR A paper copy in the mail If you have any lab test that is abnormal or we need to change your treatment, we will call you to review the results.   Testing/Procedures: Your physician has ordered for you to wear a 24 hour blood pressure cuff    Follow-Up: At [facility], you and your health needs are our priority.  As part of our continuing mission to provide you with exceptional heart care, we have created designated Provider Care Teams.  These Care Teams include your primary Cardiologist (physician) and Advanced Practice Providers (APPs -  Physician Assistants and Nurse Practitioners) who all work together to provide you with the care you need, when you need it.  We recommend signing up for the patient portal called "MyChart".  Sign up information is provided on this After Visit Summary.  MyChart is used to connect with patients for Virtual Visits (Telemedicine).  Patients are able to view lab/test results, encounter notes, upcoming appointments, etc.  Non-urgent messages can be sent to your provider as well.   To learn more about what you can do with MyChart, go to [email or web address].    Your next appointment:   6 month(s)  Provider:   Ahmad Alert, MD     Other Instructions Please let us  know if you are interested in starting Atorvastatin (Lipitor) or Rosuvastatin (Crestor) for your cholesterol

## 2023-06-10 NOTE — Assessment & Plan Note (Signed)
 Blood pressures at home fluctuate.  She has had systolics as low as 130s and as high as 160s.  She tends to have whitecoat hypertension.  We have made several adjustments in her medications in the past. -Obtain 24-hour ambulatory blood pressure monitor -If average blood pressure above goal, adjust amlodipine  or valsartan  -Continue amlodipine  5 mg daily, Cardura  4 mg daily, Lasix  20 mg daily, hydralazine  25 mg as needed, Toprol -XL 25 mg twice daily, valsartan  160 mg daily

## 2023-06-10 NOTE — Progress Notes (Signed)
 Cardiology Office Note:    Date:  06/10/2023  ID:  Deborah Lowe, DOB 07/08/1940, MRN [id number] PCP: Yevette Hem, FNP  [facility] Providers Cardiologist:  Ahmad Alert, MD Cardiology APP:  Gabino Joe, PA-C       Patient Profile:      Paroxysmal atrial fibrillation TTE 09/10/2022: EF 60-65, no RWMA, normal RVSF, normal PASP, RVSP 30.9, mild LAE, trivial MR, trivial AI, RAP 3 Nonobstructive coronary artery disease  Normal coronary arteries (cath 01/2007)  CCTA 08/19/2022: CAC score 28 (31st percentile), minimal nonobstructive CAD-LAD proximal 1-24, LCx mid 1-24; aortic atherosclerosis, aorta 33 mm Palpitations  Hypertension  Hyperlipidemia  Breast CA s/p lumpectomy (L breast)          History of Present Illness:  Discussed the use of AI scribe software for clinical note transcription with the patient, who gave verbal consent to proceed.  Deborah Lowe is a 83 y.o. female who returns for follow-up of hypertension, paroxysmal atrial fibrillation, CAD.  She was last seen 11/2022. She is here alone. She continues to note fluctuations in her blood pressure, with occasional elevations, particularly in the morning and when she anticipates medical appointments. However, she denied experiencing any heart palpitations. She reported no episodes of extremely low blood pressure, with the lowest readings being 119 and 124 on separate occasions. The patient has been adhering to a low-salt diet and does not smoke. She denied experiencing any chest discomfort, shortness of breath, swelling, or syncope. She reported an increase in bowel movements, which she described as more frequent and loose, but denied any blood in the stool.     Review of Systems  Gastrointestinal:  Negative for hematochezia and melena.  Genitourinary:  Negative for hematuria.  -See HPI    Studies Reviewed:       Labs reviewed-chart review 04/14/2023: K 4.8, creatinine 0.76, ALT 13, total cholesterol 190,  HDL 63, triglycerides 73, LDL 114, Hgb 12.7      Risk Assessment/Calculations:    CHA2DS2-VASc Score = 5   This indicates a 7.2% annual risk of stroke. The patient's score is based upon: CHF History: 0 HTN History: 1 Diabetes History: 0 Stroke History: 0 Vascular Disease History: 1 Age Score: 2 Gender Score: 1    HYPERTENSION CONTROL Vitals:   06/10/23 1344 06/10/23 1805  BP: (!) 170/50 (!) 156/60    The patient's blood pressure is elevated above target today.  In order to address the patient's elevated BP: Blood pressure will be monitored at home to determine if medication changes need to be made.          Physical Exam:   VS:  BP (!) 156/60   Pulse 80   Ht 5\' 1"  (1.549 m)   Wt 115 lb 12.8 oz (52.5 kg)   SpO2 98%   BMI 21.88 kg/m    Wt Readings from Last 3 Encounters:  06/10/23 115 lb 12.8 oz (52.5 kg)  04/27/23 119 lb (54 kg)  04/14/23 116 lb (52.6 kg)    Constitutional:      Appearance: Healthy appearance. Not in distress.  Pulmonary:     Breath sounds: Normal breath sounds. No wheezing. No rales.  Cardiovascular:     Normal rate. Regular rhythm.     Murmurs: There is no murmur.  Edema:    Peripheral edema absent.      Assessment and Plan:   Assessment & Plan Paroxysmal atrial fibrillation (HCC) Maintaining normal sinus rhythm. She is  tolerating beta-blocker, anticoag Rx. Recent SCr, Hgb normal.  She weighs less than 60 kg.  She is now 82. -Decrease Eliquis  to 2.5 mg twice daily -Continue metoprolol  succinate 25 mg twice daily -Follow-up 6 months Essential hypertension Blood pressures at home fluctuate.  She has had systolics as low as 130s and as high as 160s.  She tends to have whitecoat hypertension.  We have made several adjustments in her medications in the past. -Obtain 24-hour ambulatory blood pressure monitor -If average blood pressure above goal, adjust amlodipine  or valsartan  -Continue amlodipine  5 mg daily, Cardura  4 mg daily, Lasix  20 mg  daily, hydralazine  25 mg as needed, Toprol -XL 25 mg twice daily, valsartan  160 mg daily Coronary artery disease involving native coronary artery of native heart without angina pectoris Mild nonobstructive CAD on coronary CTA March 2024.  She is doing well without chest discomfort to suggest angina.  She does not need antiplatelet therapy as she is on Eliquis .  We discussed the benefits of statin therapy to reduce overall risk.  She would like to consider this.  Recent LDL was 114.  Goal should be less than 70. Elevated blood pressure reading       Dispo:  Return in about 6 months (around 12/08/2023) for Routine Follow Up, w/ Marlyse Single, PA-C.  Signed, Marlyse Single, PA-C

## 2023-06-10 NOTE — Assessment & Plan Note (Signed)
 Maintaining normal sinus rhythm. She is tolerating beta-blocker, anticoag Rx. Recent SCr, Hgb normal.  She weighs less than 60 kg.  She is now 82. -Decrease Eliquis  to 2.5 mg twice daily -Continue metoprolol  succinate 25 mg twice daily -Follow-up 6 months

## 2023-06-12 ENCOUNTER — Inpatient Hospital Stay: Payer: Medicare Other | Attending: Adult Health | Admitting: Adult Health

## 2023-06-25 DIAGNOSIS — R319 Hematuria, unspecified: Secondary | ICD-10-CM | POA: Diagnosis not present

## 2023-06-25 DIAGNOSIS — Z6822 Body mass index (BMI) 22.0-22.9, adult: Secondary | ICD-10-CM | POA: Diagnosis not present

## 2023-06-25 DIAGNOSIS — Z1151 Encounter for screening for human papillomavirus (HPV): Secondary | ICD-10-CM | POA: Diagnosis not present

## 2023-06-25 DIAGNOSIS — Z01419 Encounter for gynecological examination (general) (routine) without abnormal findings: Secondary | ICD-10-CM | POA: Diagnosis not present

## 2023-06-25 DIAGNOSIS — R87616 Satisfactory cervical smear but lacking transformation zone: Secondary | ICD-10-CM | POA: Diagnosis not present

## 2023-06-25 DIAGNOSIS — Z124 Encounter for screening for malignant neoplasm of cervix: Secondary | ICD-10-CM | POA: Diagnosis not present

## 2023-07-01 ENCOUNTER — Other Ambulatory Visit: Payer: Self-pay | Admitting: Family

## 2023-07-01 DIAGNOSIS — F419 Anxiety disorder, unspecified: Secondary | ICD-10-CM

## 2023-07-13 DIAGNOSIS — R300 Dysuria: Secondary | ICD-10-CM | POA: Diagnosis not present

## 2023-07-14 ENCOUNTER — Ambulatory Visit: Payer: Medicare Other | Admitting: Nurse Practitioner

## 2023-07-29 ENCOUNTER — Ambulatory Visit: Admitting: Family Medicine

## 2023-07-29 ENCOUNTER — Encounter: Payer: Self-pay | Admitting: Family Medicine

## 2023-07-29 VITALS — BP 154/60 | HR 55 | Temp 96.8°F | Ht 61.0 in | Wt 116.6 lb

## 2023-07-29 DIAGNOSIS — J3489 Other specified disorders of nose and nasal sinuses: Secondary | ICD-10-CM | POA: Diagnosis not present

## 2023-07-29 DIAGNOSIS — J0110 Acute frontal sinusitis, unspecified: Secondary | ICD-10-CM | POA: Diagnosis not present

## 2023-07-29 MED ORDER — FLUTICASONE PROPIONATE 50 MCG/ACT NA SUSP: 2.0000 | Freq: Every day | NASAL | 6 refills | Status: DC

## 2023-07-29 MED ORDER — AMOXICILLIN-POT CLAVULANATE 875-125 MG PO TABS: 1.0000 | ORAL_TABLET | Freq: Two times a day (BID) | ORAL | 0 refills | Status: AC

## 2023-07-29 NOTE — Progress Notes (Signed)
 Subjective:  Patient ID: Deborah Lowe, female    DOB: 29-Nov-1940, 83 y.o.   MRN: [id number]  Patient Care Team: Junie Spencer, FNP as PCP - General (Family Medicine) Nahser, Deloris Ping, MD as PCP - Cardiology (Cardiology) Richarda Overlie, MD as Consulting Physician (Obstetrics and Gynecology) Adam Phenix, DPM as Consulting Physician (Podiatry) Cherly Beach, MD as Consulting Physician (Dermatology) Axel Filler, Larna Daughters, NP as Nurse Practitioner (Hematology and Oncology) Kennon Rounds as Physician Assistant (Cardiology)   Chief Complaint:  Nasal Congestion (X 1 week )   HPI: Deborah Lowe is a 83 y.o. female presenting on 07/29/2023 for Nasal Congestion (X 1 week )   Discussed the use of AI scribe software for clinical note transcription with the patient, who gave verbal consent to proceed.  History of Present Illness   Deborah Lowe is an 83 year old female who presents with sinus congestion and pressure.  She has been experiencing sinus congestion and pressure for about a week, concentrated in her nose and ears, with occasional epistaxis when blowing her nose. The pressure is primarily located in her forehead. No fever or cough. She has been taking Claritin for about a week to manage her symptoms but has not tried any other over-the-counter medications like Flonase.  She has difficulty with hearing, which has been noticed by her daughter, leading to some tension in her communication.  She reports difficulty managing her blood pressure, noting that it is often elevated in the clinical setting but generally well-controlled at home.          Relevant past medical, surgical, family, and social history reviewed and updated as indicated.  Allergies and medications reviewed and updated. Data reviewed: Chart in Epic.   Past Medical History:  Diagnosis Date   Breast cancer (HCC) 05/26/2010   s/p lumpectomy of left breast with XRT   Hypertension     Palpitations    on beta blocker therapy   Paroxysmal atrial fibrillation (HCC) 08/12/2022   TTE 09/10/22: EF 60-65, no RWMA, NL RVSF, NL PASP, RVSP 30.9, mild LAE, trivial MR, trivial AI, RAP 3   Ringing in ears    Wears glasses     Past Surgical History:  Procedure Laterality Date   BREAST LUMPECTOMY Left    BREAST SURGERY     CARDIAC CATHETERIZATION  01/29/2007   CARDIOVASCULAR STRESS TEST  07/19/2002   EF 84%   CHOLECYSTECTOMY     TEAR DUCT PROBING      Social History   Socioeconomic History   Marital status: Married    Spouse name: [name]   Number of children: 1   Years of education: 12   Highest education level: Not on file  Occupational History   Occupation: retired  Tobacco Use   Smoking status: Never   Smokeless tobacco: Never  Vaping Use   Vaping status: Never Used  Substance and Sexual Activity   Alcohol use: No   Drug use: No   Sexual activity: Yes    Birth control/protection: Post-menopausal  Other Topics Concern   Not on file  Social History Narrative   One daughter   Social Drivers of Health   Financial Resource Strain: Low Risk  (07/30/2022)   Overall Financial Resource Strain (CARDIA)    Difficulty of Paying Living Expenses: Not hard at all  Food Insecurity: No Food Insecurity (07/30/2022)   Hunger Vital Sign    Worried About Running Out of Food in the  Last Year: Never true    Ran Out of Food in the Last Year: Never true  Transportation Needs: No Transportation Needs (07/30/2022)   PRAPARE - Administrator, Civil Service (Medical): No    Lack of Transportation (Non-Medical): No  Physical Activity: Insufficiently Active (07/30/2022)   Exercise Vital Sign    Days of Exercise per Week: 3 days    Minutes of Exercise per Session: 30 min  Stress: No Stress Concern Present (07/30/2022)   [name] of Occupational Health - Occupational Stress Questionnaire    Feeling of Stress : Not at all  Social Connections: Moderately Isolated (07/30/2022)    Social Connection and Isolation Panel [NHANES]    Frequency of Communication with Friends and Family: More than three times a week    Frequency of Social Gatherings with Friends and Family: More than three times a week    Attends Religious Services: Never    Database administrator or Organizations: No    Attends Banker Meetings: Never    Marital Status: Married  Catering manager Violence: Not At Risk (07/30/2022)   Humiliation, Afraid, Rape, and Kick questionnaire    Fear of Current or Ex-Partner: No    Emotionally Abused: No    Physically Abused: No    Sexually Abused: No    Outpatient Encounter Medications as of 07/29/2023  Medication Sig   amLODipine (NORVASC) 5 MG tablet Take 1 tablet (5 mg total) by mouth daily.   amoxicillin-clavulanate (AUGMENTIN) 875-125 MG tablet Take 1 tablet by mouth 2 (two) times daily for 7 days.   apixaban (ELIQUIS) 2.5 MG TABS tablet Take 1 tablet (2.5 mg total) by mouth 2 (two) times daily.   calcium-vitamin D (OSCAL WITH D) 250-125 MG-UNIT tablet Take 1 tablet by mouth daily.   doxazosin (CARDURA) 2 MG tablet Take 2 tablets (4 mg total) by mouth daily.   escitalopram (LEXAPRO) 5 MG tablet TAKE 1 TABLET (5 MG TOTAL) BY MOUTH DAILY.   fluticasone (FLONASE) 50 MCG/ACT nasal spray Place 2 sprays into both nostrils daily.   hydrALAZINE (APRESOLINE) 25 MG tablet TAKE ONE (1) TABLET BY MOUTH AS NEEDED FOR > 170 X 2 , 20 MINUTES APART.   KLOR-CON M10 10 MEQ tablet TAKE ONE (1) TABLET (10 MEQ) EVERY DAY AS NEEDED FOR SWELLING WITH LASIX.   metoprolol succinate (TOPROL XL) 25 MG 24 hr tablet Take 1 tablet (25 mg total) by mouth 2 (two) times daily.   valsartan (DIOVAN) 160 MG tablet Take 1 tablet (160 mg total) by mouth daily.   furosemide (LASIX) 20 MG tablet Take 1 tablet (20 mg total) by mouth daily.   No facility-administered encounter medications on file as of 07/29/2023.    Allergies  Allergen Reactions   Avelox [Moxifloxacin Hcl In Nacl]  Anaphylaxis    Pertinent ROS per HPI, otherwise unremarkable      Objective:  BP (!) 154/60   Pulse (!) 55   Temp (!) 96.8 F (36 C)   Ht 5\' 1"  (1.549 m)   Wt 116 lb 9.6 oz (52.9 kg)   SpO2 95%   BMI 22.03 kg/m    Wt Readings from Last 3 Encounters:  07/29/23 116 lb 9.6 oz (52.9 kg)  06/10/23 115 lb 12.8 oz (52.5 kg)  04/27/23 119 lb (54 kg)    Physical Exam Vitals and nursing note reviewed.  Constitutional:      General: She is not in acute distress.    Appearance:  Normal appearance. She is not ill-appearing, toxic-appearing or diaphoretic.  HENT:     Head: Normocephalic and atraumatic.     Right Ear: A middle ear effusion is present.     Left Ear: A middle ear effusion is present.     Nose: Congestion present.     Right Turbinates: Enlarged.     Left Turbinates: Enlarged.     Right Sinus: Frontal sinus tenderness present.     Left Sinus: Frontal sinus tenderness present.     Mouth/Throat:     Lips: Pink.     Mouth: Mucous membranes are moist.     Pharynx: Posterior oropharyngeal erythema and postnasal drip present.  Eyes:     Conjunctiva/sclera: Conjunctivae normal.     Pupils: Pupils are equal, round, and reactive to light.  Cardiovascular:     Rate and Rhythm: Normal rate and regular rhythm.     Heart sounds: Normal heart sounds.  Pulmonary:     Effort: Pulmonary effort is normal.     Breath sounds: Normal breath sounds.  Skin:    General: Skin is warm and dry.     Capillary Refill: Capillary refill takes less than 2 seconds.  Neurological:     General: No focal deficit present.     Mental Status: She is alert and oriented to person, place, and time.  Psychiatric:        Mood and Affect: Mood normal.        Behavior: Behavior normal. Behavior is cooperative.        Thought Content: Thought content normal.        Judgment: Judgment normal.    Physical Exam   HEENT: Throat with thick drainage. Frontal sinus tenderness. Fluid behind ears. CHEST:  Lungs clear to auscultation.        Results for orders placed or performed in visit on 04/27/23  Microscopic Examination   Collection Time: 04/27/23 11:56 AM   Urine  Result Value Ref Range   WBC, UA >30 (A) 0 - 5 /hpf   RBC, Urine >30 (A) 0 - 2 /hpf   Epithelial Cells (non renal) None seen 0 - 10 /hpf   Renal Epithel, UA None seen None seen /hpf   Bacteria, UA Many (A) None seen/Few   Yeast, UA None seen None seen  Urinalysis, Routine w reflex microscopic   Collection Time: 04/27/23 11:56 AM  Result Value Ref Range   Specific Gravity, UA 1.010 1.005 - 1.030   pH, UA 6.5 5.0 - 7.5   Color, UA Yellow Yellow   Appearance Ur Cloudy (A) Clear   Leukocytes,UA 2+ (A) Negative   Protein,UA Negative Negative/Trace   Glucose, UA Trace (A) Negative   Ketones, UA Negative Negative   RBC, UA 2+ (A) Negative   Bilirubin, UA Negative Negative   Urobilinogen, Ur 2.0 (H) 0.2 - 1.0 mg/dL   Nitrite, UA Positive (A) Negative   Microscopic Examination See below:   Urine Culture   Collection Time: 04/27/23  1:04 PM   Specimen: Urine   UR  Result Value Ref Range   Urine Culture, Routine Final report (A)    Organism ID, Bacteria Escherichia coli (A)    Antimicrobial Susceptibility Comment        Pertinent labs & imaging results that were available during my care of the patient were reviewed by me and considered in my medical decision making.  Assessment & Plan:  Deborah Lowe was seen today for nasal congestion.  Diagnoses and  all orders for this visit:  Sinus pressure -     fluticasone (FLONASE) 50 MCG/ACT nasal spray; Place 2 sprays into both nostrils daily.  Acute non-recurrent frontal sinusitis -     fluticasone (FLONASE) 50 MCG/ACT nasal spray; Place 2 sprays into both nostrils daily. -     amoxicillin-clavulanate (AUGMENTIN) 875-125 MG tablet; Take 1 tablet by mouth 2 (two) times daily for 7 days.     Assessment and Plan    Sinusitis Acute sinusitis with nasal congestion, sinus  pressure, and bloody mucus for one week. Fluid behind ears and thick drainage noted. Claritin provided minimal relief. Discussed intranasal steroids (Flonase) to reduce inflammation and pressure, facilitating drainage. Augmentin prescribed due to significant symptoms and fluid accumulation. Emphasized hydration and adherence to medication regimen. - Prescribe Flonase nasal spray - Prescribe Augmentin 875 mg twice daily for 7 days - Encourage increased water intake - Advise use of Tylenol for pain control - Instruct to use Flonase for at least two weeks - Advise to report any worsening or new symptoms  Hypertension Blood pressure well-controlled at home, likely elevated in clinic due to white coat syndrome. - Monitor blood pressure at home - Follow up with primary care provider for checkup and blood work in April  General Health Maintenance Due for routine blood work and checkup. - Schedule follow-up appointment with primary care provider in April for routine checkup and blood work.          Continue all other maintenance medications.  Follow up plan: Return if symptoms worsen or fail to improve.   Continue healthy lifestyle choices, including diet (rich in fruits, vegetables, and lean proteins, and low in salt and simple carbohydrates) and exercise (at least 30 minutes of moderate physical activity daily).  Educational handout given for sinus pressure/pain  The above assessment and management plan was discussed with the patient. The patient verbalized understanding of and has agreed to the management plan. Patient is aware to call the clinic if they develop any new symptoms or if symptoms persist or worsen. Patient is aware when to return to the clinic for a follow-up visit. Patient educated on when it is appropriate to go to the emergency department.   Kari Baars, FNP-C [facility] [phone]

## 2023-08-06 DIAGNOSIS — K649 Unspecified hemorrhoids: Secondary | ICD-10-CM | POA: Diagnosis not present

## 2023-08-06 DIAGNOSIS — R197 Diarrhea, unspecified: Secondary | ICD-10-CM | POA: Diagnosis not present

## 2023-08-10 DIAGNOSIS — R197 Diarrhea, unspecified: Secondary | ICD-10-CM | POA: Diagnosis not present

## 2023-08-25 ENCOUNTER — Other Ambulatory Visit: Payer: Self-pay | Admitting: Family

## 2023-08-25 ENCOUNTER — Ambulatory Visit (INDEPENDENT_AMBULATORY_CARE_PROVIDER_SITE_OTHER)

## 2023-08-25 ENCOUNTER — Ambulatory Visit (INDEPENDENT_AMBULATORY_CARE_PROVIDER_SITE_OTHER): Admitting: Family

## 2023-08-25 ENCOUNTER — Encounter: Payer: Self-pay | Admitting: Family

## 2023-08-25 VITALS — BP 159/68 | HR 54 | Temp 97.5°F | Ht 61.0 in | Wt 118.4 lb

## 2023-08-25 DIAGNOSIS — I480 Paroxysmal atrial fibrillation: Secondary | ICD-10-CM | POA: Diagnosis not present

## 2023-08-25 DIAGNOSIS — Z780 Asymptomatic menopausal state: Secondary | ICD-10-CM

## 2023-08-25 DIAGNOSIS — Z853 Personal history of malignant neoplasm of breast: Secondary | ICD-10-CM | POA: Diagnosis not present

## 2023-08-25 DIAGNOSIS — I10 Essential (primary) hypertension: Secondary | ICD-10-CM

## 2023-08-25 DIAGNOSIS — I2510 Atherosclerotic heart disease of native coronary artery without angina pectoris: Secondary | ICD-10-CM

## 2023-08-25 DIAGNOSIS — M810 Age-related osteoporosis without current pathological fracture: Secondary | ICD-10-CM | POA: Diagnosis not present

## 2023-08-25 DIAGNOSIS — F419 Anxiety disorder, unspecified: Secondary | ICD-10-CM | POA: Diagnosis not present

## 2023-08-25 MED ORDER — ESCITALOPRAM OXALATE 5 MG PO TABS: 5.0000 mg | ORAL_TABLET | Freq: Every day | ORAL | 0 refills | Status: DC

## 2023-08-25 MED ORDER — AMLODIPINE BESYLATE 5 MG PO TABS: 5.0000 mg | ORAL_TABLET | Freq: Every day | ORAL | 1 refills | Status: DC

## 2023-08-25 NOTE — Progress Notes (Signed)
 Subjective:    Patient ID: Deborah Lowe, female    DOB: 06-02-1940, 83 y.o.   MRN: [id number]  Chief Complaint  Patient presents with   Medical Management of Chronic Issues   PT presents to the office for chronic follow up.  She was seen at Cardiologists every 3 months for A FIb, CAD, and HTN. Taking Eliquis 5 mg BID. Stable.   Her BP is elevated today, but brings in home log and her BP has been 164-62-134/58. She has not taken her amlodipine 5 mg.    She is followed by GYN annually.    Has hx of left breast cancer and gets annual mammograms.  Complaining of urge incontinence at times.  Gastroesophageal Reflux She complains of belching, heartburn and a hoarse voice. This is a chronic problem. The current episode started more than 1 year ago. The problem occurs occasionally. The problem has been resolved. The symptoms are aggravated by certain foods. She has tried a PPI for the symptoms. The treatment provided moderate relief.  Hypertension This is a chronic problem. The current episode started more than 1 year ago. The problem has been waxing and waning since onset. The problem is uncontrolled. Associated symptoms include anxiety and malaise/fatigue. Pertinent negatives include no peripheral edema or shortness of breath. Risk factors for coronary artery disease include dyslipidemia and sedentary lifestyle. The current treatment provides moderate improvement.  Anxiety Presents for follow-up visit. Symptoms include depressed mood, excessive worry and nervous/anxious behavior. Patient reports no shortness of breath. Symptoms occur occasionally. The severity of symptoms is mild.        Review of Systems  Constitutional:  Positive for malaise/fatigue.  HENT:  Positive for hoarse voice.   Respiratory:  Negative for shortness of breath.   Gastrointestinal:  Positive for heartburn.  Psychiatric/Behavioral:  The patient is nervous/anxious.   All other systems reviewed and are  negative.      Objective:   Physical Exam Vitals reviewed.  Constitutional:      General: She is not in acute distress.    Appearance: She is well-developed.  HENT:     Head: Normocephalic and atraumatic.     Right Ear: Tympanic membrane normal.     Left Ear: Tympanic membrane normal.  Eyes:     Pupils: Pupils are equal, round, and reactive to light.  Neck:     Thyroid: No thyromegaly.  Cardiovascular:     Rate and Rhythm: Normal rate and regular rhythm.     Heart sounds: Normal heart sounds. No murmur heard. Pulmonary:     Effort: Pulmonary effort is normal. No respiratory distress.     Breath sounds: Normal breath sounds. No wheezing.  Abdominal:     General: Bowel sounds are normal. There is no distension.     Palpations: Abdomen is soft.     Tenderness: There is no abdominal tenderness.  Musculoskeletal:        General: No tenderness. Normal range of motion.     Cervical back: Normal range of motion and neck supple.  Skin:    General: Skin is warm and dry.  Neurological:     Mental Status: She is alert and oriented to person, place, and time.     Cranial Nerves: No cranial nerve deficit.     Deep Tendon Reflexes: Reflexes are normal and symmetric.  Psychiatric:        Behavior: Behavior normal.        Thought Content: Thought content normal.  Judgment: Judgment normal.        BP (!) 159/68 Comment: at home this morning  Pulse (!) 54   Temp (!) 97.5 F (36.4 C) (Temporal)   Ht 5\' 1"  (1.549 m)   Wt 118 lb 6.4 oz (53.7 kg)   SpO2 98%   BMI 22.37 kg/m   Assessment & Plan:   MAILE LINFORD comes in today with chief complaint of Medical Management of Chronic Issues   Diagnosis and orders addressed:  1. Coronary artery disease involving native coronary artery of native heart without angina pectoris -Start Norvasc 5 mg  -Daily blood pressure log given with instructions on how to fill out and told to bring to next visit -Dash diet information  given -Exercise encouraged - Stress Management  -Continue current meds -RTO in 2 weeks  - amLODipine (NORVASC) 5 MG tablet; Take 1 tablet (5 mg total) by mouth daily.  Dispense: 90 tablet; Refill: 1 - CMP14+EGFR - CBC with Differential/Platelet  2. Essential hypertension (Primary) - amLODipine (NORVASC) 5 MG tablet; Take 1 tablet (5 mg total) by mouth daily.  Dispense: 90 tablet; Refill: 1 - CMP14+EGFR - CBC with Differential/Platelet  3. Anxiety - escitalopram (LEXAPRO) 5 MG tablet; Take 1 tablet (5 mg total) by mouth daily.  Dispense: 90 tablet; Refill: 0 - CMP14+EGFR - CBC with Differential/Platelet  4. History of left breast cancer - CMP14+EGFR - CBC with Differential/Platelet  5. Paroxysmal atrial fibrillation (HCC) - CMP14+EGFR - CBC with Differential/Platelet    Labs pending Norvasc 5 mg  -Daily blood pressure log given with instructions on how to fill out and told to bring to next visit -Dash diet information given -Exercise encouraged - Stress Management  -Continue current meds -RTO in 2 weeks  Health Maintenance reviewed Diet and exercise encouraged  Follow up plan: 2 weeks  Jannifer Rodney, FNP

## 2023-08-25 NOTE — Patient Instructions (Signed)
 Hypertension, Adult High blood pressure (hypertension) is when the force of blood pumping through the arteries is too strong. The arteries are the blood vessels that carry blood from the heart throughout the body. Hypertension forces the heart to work harder to pump blood and may cause arteries to become narrow or stiff. Untreated or uncontrolled hypertension can lead to a heart attack, heart failure, a stroke, kidney disease, and other problems. A blood pressure reading consists of a higher number over a lower number. Ideally, your blood pressure should be below 120/80. The first ("top") number is called the systolic pressure. It is a measure of the pressure in your arteries as your heart beats. The second ("bottom") number is called the diastolic pressure. It is a measure of the pressure in your arteries as the heart relaxes. What are the causes? The exact cause of this condition is not known. There are some conditions that result in high blood pressure. What increases the risk? Certain factors may make you more likely to develop high blood pressure. Some of these risk factors are under your control, including: Smoking. Not getting enough exercise or physical activity. Being overweight. Having too much fat, sugar, calories, or salt (sodium) in your diet. Drinking too much alcohol. Other risk factors include: Having a personal history of heart disease, diabetes, high cholesterol, or kidney disease. Stress. Having a family history of high blood pressure and high cholesterol. Having obstructive sleep apnea. Age. The risk increases with age. What are the signs or symptoms? High blood pressure may not cause symptoms. Very high blood pressure (hypertensive crisis) may cause: Headache. Fast or irregular heartbeats (palpitations). Shortness of breath. Nosebleed. Nausea and vomiting. Vision changes. Severe chest pain, dizziness, and seizures. How is this diagnosed? This condition is diagnosed by  measuring your blood pressure while you are seated, with your arm resting on a flat surface, your legs uncrossed, and your feet flat on the floor. The cuff of the blood pressure monitor will be placed directly against the skin of your upper arm at the level of your heart. Blood pressure should be measured at least twice using the same arm. Certain conditions can cause a difference in blood pressure between your right and left arms. If you have a high blood pressure reading during one visit or you have normal blood pressure with other risk factors, you may be asked to: Return on a different day to have your blood pressure checked again. Monitor your blood pressure at home for 1 week or longer. If you are diagnosed with hypertension, you may have other blood or imaging tests to help your health care provider understand your overall risk for other conditions. How is this treated? This condition is treated by making healthy lifestyle changes, such as eating healthy foods, exercising more, and reducing your alcohol intake. You may be referred for counseling on a healthy diet and physical activity. Your health care provider may prescribe medicine if lifestyle changes are not enough to get your blood pressure under control and if: Your systolic blood pressure is above 130. Your diastolic blood pressure is above 80. Your personal target blood pressure may vary depending on your medical conditions, your age, and other factors. Follow these instructions at home: Eating and drinking  Eat a diet that is high in fiber and potassium, and low in sodium, added sugar, and fat. An example of this eating plan is called the DASH diet. DASH stands for Dietary Approaches to Stop Hypertension. To eat this way: Eat  plenty of fresh fruits and vegetables. Try to fill one half of your plate at each meal with fruits and vegetables. Eat whole grains, such as whole-wheat pasta, brown rice, or whole-grain bread. Fill about one  fourth of your plate with whole grains. Eat or drink low-fat dairy products, such as skim milk or low-fat yogurt. Avoid fatty cuts of meat, processed or cured meats, and poultry with skin. Fill about one fourth of your plate with lean proteins, such as fish, chicken without skin, beans, eggs, or tofu. Avoid pre-made and processed foods. These tend to be higher in sodium, added sugar, and fat. Reduce your daily sodium intake. Many people with hypertension should eat less than 1,500 mg of sodium a day. Do not drink alcohol if: Your health care provider tells you not to drink. You are pregnant, may be pregnant, or are planning to become pregnant. If you drink alcohol: Limit how much you have to: 0-1 drink a day for women. 0-2 drinks a day for men. Know how much alcohol is in your drink. In the [place] one drink equals one 12 oz bottle of beer (355 mL), one 5 oz glass of wine (148 mL), or one 1 oz glass of hard liquor (44 mL). Lifestyle  Work with your health care provider to maintain a healthy body weight or to lose weight. Ask what an ideal weight is for you. Get at least 30 minutes of exercise that causes your heart to beat faster (aerobic exercise) most days of the week. Activities may include walking, swimming, or biking. Include exercise to strengthen your muscles (resistance exercise), such as Pilates or lifting weights, as part of your weekly exercise routine. Try to do these types of exercises for 30 minutes at least 3 days a week. Do not use any products that contain nicotine or tobacco. These products include cigarettes, chewing tobacco, and vaping devices, such as e-cigarettes. If you need help quitting, ask your health care provider. Monitor your blood pressure at home as told by your health care provider. Keep all follow-up visits. This is important. Medicines Take over-the-counter and prescription medicines only as told by your health care provider. Follow directions carefully. Blood  pressure medicines must be taken as prescribed. Do not skip doses of blood pressure medicine. Doing this puts you at risk for problems and can make the medicine less effective. Ask your health care provider about side effects or reactions to medicines that you should watch for. Contact a health care provider if you: Think you are having a reaction to a medicine you are taking. Have headaches that keep coming back (recurring). Feel dizzy. Have swelling in your ankles. Have trouble with your vision. Get help right away if you: Develop a severe headache or confusion. Have unusual weakness or numbness. Feel faint. Have severe pain in your chest or abdomen. Vomit repeatedly. Have trouble breathing. These symptoms may be an emergency. Get help right away. Call 911. Do not wait to see if the symptoms will go away. Do not drive yourself to the hospital. Summary Hypertension is when the force of blood pumping through your arteries is too strong. If this condition is not controlled, it may put you at risk for serious complications. Your personal target blood pressure may vary depending on your medical conditions, your age, and other factors. For most people, a normal blood pressure is less than 120/80. Hypertension is treated with lifestyle changes, medicines, or a combination of both. Lifestyle changes include losing weight, eating a healthy,  low-sodium diet, exercising more, and limiting alcohol. This information is not intended to replace advice given to you by your health care provider. Make sure you discuss any questions you have with your health care provider. Document Revised: 03/19/2021 Document Reviewed: 03/19/2021 Elsevier Patient Education  2024 [employer].

## 2023-08-26 LAB — CBC WITH DIFFERENTIAL/PLATELET
Basophils Absolute: 0.1 10*3/uL (ref 0.0–0.2)
Basos: 1 %
EOS (ABSOLUTE): 0.1 10*3/uL (ref 0.0–0.4)
Eos: 2 %
Hematocrit: 39.9 % (ref 34.0–46.6)
Hemoglobin: 12.9 g/dL (ref 11.1–15.9)
Immature Grans (Abs): 0 10*3/uL (ref 0.0–0.1)
Immature Granulocytes: 0 %
Lymphocytes Absolute: 1.5 10*3/uL (ref 0.7–3.1)
Lymphs: 28 %
MCH: 29.3 pg (ref 26.6–33.0)
MCHC: 32.3 g/dL (ref 31.5–35.7)
MCV: 91 fL (ref 79–97)
Monocytes Absolute: 0.4 10*3/uL (ref 0.1–0.9)
Monocytes: 8 %
Neutrophils Absolute: 3.3 10*3/uL (ref 1.4–7.0)
Neutrophils: 61 %
Platelets: 219 10*3/uL (ref 150–450)
RBC: 4.4 x10E6/uL (ref 3.77–5.28)
RDW: 12.5 % (ref 11.7–15.4)
WBC: 5.4 10*3/uL (ref 3.4–10.8)

## 2023-08-26 LAB — CMP14+EGFR
ALT: 12 IU/L (ref 0–32)
AST: 13 IU/L (ref 0–40)
Albumin: 4.3 g/dL (ref 3.8–4.8)
Alkaline Phosphatase: 91 IU/L (ref 44–121)
BUN/Creatinine Ratio: 18 (ref 12–28)
BUN: 13 mg/dL (ref 8–27)
Bilirubin Total: 0.8 mg/dL (ref 0.0–1.2)
CO2: 24 mmol/L (ref 20–29)
Calcium: 9.4 mg/dL (ref 8.7–10.3)
Chloride: 101 mmol/L (ref 96–106)
Creatinine, Ser: 0.73 mg/dL (ref 0.57–1.00)
Globulin, Total: 2.1 g/dL (ref 1.5–4.5)
Glucose: 80 mg/dL (ref 70–99)
Potassium: 4.1 mmol/L (ref 3.5–5.2)
Sodium: 138 mmol/L (ref 134–144)
Total Protein: 6.4 g/dL (ref 6.0–8.5)
eGFR: 83 mL/min/{1.73_m2} (ref >59)

## 2023-08-27 ENCOUNTER — Other Ambulatory Visit: Payer: Self-pay | Admitting: Family

## 2023-08-27 DIAGNOSIS — M810 Age-related osteoporosis without current pathological fracture: Secondary | ICD-10-CM

## 2023-08-27 MED ORDER — ALENDRONATE SODIUM 70 MG PO TABS: 70.0000 mg | ORAL_TABLET | ORAL | 1 refills | Status: AC

## 2023-08-28 DIAGNOSIS — L814 Other melanin hyperpigmentation: Secondary | ICD-10-CM | POA: Diagnosis not present

## 2023-08-28 DIAGNOSIS — R233 Spontaneous ecchymoses: Secondary | ICD-10-CM | POA: Diagnosis not present

## 2023-09-08 ENCOUNTER — Ambulatory Visit: Admitting: Family

## 2023-09-14 ENCOUNTER — Encounter: Payer: Self-pay | Admitting: Family

## 2023-09-14 ENCOUNTER — Ambulatory Visit (INDEPENDENT_AMBULATORY_CARE_PROVIDER_SITE_OTHER): Admitting: Family

## 2023-09-14 VITALS — BP 123/51 | HR 58 | Temp 97.1°F | Ht 61.0 in | Wt 117.0 lb

## 2023-09-14 DIAGNOSIS — I10 Essential (primary) hypertension: Secondary | ICD-10-CM

## 2023-09-14 NOTE — Progress Notes (Signed)
 Subjective:    Patient ID: Deborah Lowe, female    DOB: 1941/01/26, 83 y.o.   MRN: [id number]  Chief Complaint  Patient presents with   Hypertension   PT presents to the office today for HTN follow up. She was seen on 08/25/23 and she was started on Norvasc  5 mg. Her home BP has been 123/51-156/73.  Hypertension This is a chronic problem. The current episode started more than 1 year ago. The problem has been resolved since onset. The problem is controlled. Pertinent negatives include no malaise/fatigue, peripheral edema or shortness of breath. Risk factors for coronary artery disease include sedentary lifestyle. Past treatments include calcium channel blockers. The current treatment provides moderate improvement.      Review of Systems  Constitutional:  Negative for malaise/fatigue.  Respiratory:  Negative for shortness of breath.   All other systems reviewed and are negative.   Social History   Socioeconomic History   Marital status: Married    Spouse name: Blaise Bumps   Number of children: 1   Years of education: 12   Highest education level: Not on file  Occupational History   Occupation: retired  Tobacco Use   Smoking status: Never   Smokeless tobacco: Never  Vaping Use   Vaping status: Never Used  Substance and Sexual Activity   Alcohol use: No   Drug use: No   Sexual activity: Yes    Birth control/protection: Post-menopausal  Other Topics Concern   Not on file  Social History Narrative   One daughter   Social Drivers of Health   Financial Resource Strain: Low Risk  (07/30/2022)   Overall Financial Resource Strain (CARDIA)    Difficulty of Paying Living Expenses: Not hard at all  Food Insecurity: No Food Insecurity (07/30/2022)   Hunger Vital Sign    Worried About Running Out of Food in the Last Year: Never true    Ran Out of Food in the Last Year: Never true  Transportation Needs: No Transportation Needs (07/30/2022)   PRAPARE - Scientist, research (physical sciences) (Medical): No    Lack of Transportation (Non-Medical): No  Physical Activity: Insufficiently Active (07/30/2022)   Exercise Vital Sign    Days of Exercise per Week: 3 days    Minutes of Exercise per Session: 30 min  Stress: No Stress Concern Present (07/30/2022)   [name] of Occupational Health - Occupational Stress Questionnaire    Feeling of Stress : Not at all  Social Connections: Moderately Isolated (07/30/2022)   Social Connection and Isolation Panel [NHANES]    Frequency of Communication with Friends and Family: More than three times a week    Frequency of Social Gatherings with Friends and Family: More than three times a week    Attends Religious Services: Never    Database administrator or Organizations: No    Attends Engineer, structural: Never    Marital Status: Married   Family History  Problem Relation Age of Onset   Colon cancer Mother    Heart disease Father    Bone cancer Brother         Objective:   Physical Exam Vitals reviewed.  Constitutional:      General: She is not in acute distress.    Appearance: She is well-developed.  HENT:     Head: Normocephalic and atraumatic.  Eyes:     Pupils: Pupils are equal, round, and reactive to light.  Neck:     Thyroid :  No thyromegaly.  Cardiovascular:     Rate and Rhythm: Normal rate and regular rhythm.     Heart sounds: Normal heart sounds. No murmur heard. Pulmonary:     Effort: Pulmonary effort is normal. No respiratory distress.     Breath sounds: Normal breath sounds. No wheezing.  Abdominal:     General: Bowel sounds are normal. There is no distension.     Palpations: Abdomen is soft.     Tenderness: There is no abdominal tenderness.  Musculoskeletal:        General: No tenderness. Normal range of motion.     Cervical back: Normal range of motion and neck supple.  Skin:    General: Skin is warm and dry.  Neurological:     Mental Status: She is alert and oriented to person,  place, and time.     Cranial Nerves: No cranial nerve deficit.     Deep Tendon Reflexes: Reflexes are normal and symmetric.  Psychiatric:        Behavior: Behavior normal.        Thought Content: Thought content normal.        Judgment: Judgment normal.       BP (!) 156/63 Comment: at home  Pulse (!) 58   Temp (!) 97.1 F (36.2 C) (Temporal)   Ht 5\' 1"  (1.549 m)   Wt 117 lb (53.1 kg)   SpO2 95%   BMI 22.11 kg/m      Assessment & Plan:  BINDI KLOMP comes in today with chief complaint of Hypertension   Diagnosis and orders addressed:  1. Essential hypertension (Primary) Home BP running 123/51 -Continue to monitor BP at home -[employer] information given -Exercise encouraged - Stress Management  -Continue current meds -RTO in 4 months     Tommas Fragmin, FNP

## 2023-09-14 NOTE — Patient Instructions (Signed)
 Hypertension, Adult High blood pressure (hypertension) is when the force of blood pumping through the arteries is too strong. The arteries are the blood vessels that carry blood from the heart throughout the body. Hypertension forces the heart to work harder to pump blood and may cause arteries to become narrow or stiff. Untreated or uncontrolled hypertension can lead to a heart attack, heart failure, a stroke, kidney disease, and other problems. A blood pressure reading consists of a higher number over a lower number. Ideally, your blood pressure should be below 120/80. The first ("top") number is called the systolic pressure. It is a measure of the pressure in your arteries as your heart beats. The second ("bottom") number is called the diastolic pressure. It is a measure of the pressure in your arteries as the heart relaxes. What are the causes? The exact cause of this condition is not known. There are some conditions that result in high blood pressure. What increases the risk? Certain factors may make you more likely to develop high blood pressure. Some of these risk factors are under your control, including: Smoking. Not getting enough exercise or physical activity. Being overweight. Having too much fat, sugar, calories, or salt (sodium) in your diet. Drinking too much alcohol. Other risk factors include: Having a personal history of heart disease, diabetes, high cholesterol, or kidney disease. Stress. Having a family history of high blood pressure and high cholesterol. Having obstructive sleep apnea. Age. The risk increases with age. What are the signs or symptoms? High blood pressure may not cause symptoms. Very high blood pressure (hypertensive crisis) may cause: Headache. Fast or irregular heartbeats (palpitations). Shortness of breath. Nosebleed. Nausea and vomiting. Vision changes. Severe chest pain, dizziness, and seizures. How is this diagnosed? This condition is diagnosed by  measuring your blood pressure while you are seated, with your arm resting on a flat surface, your legs uncrossed, and your feet flat on the floor. The cuff of the blood pressure monitor will be placed directly against the skin of your upper arm at the level of your heart. Blood pressure should be measured at least twice using the same arm. Certain conditions can cause a difference in blood pressure between your right and left arms. If you have a high blood pressure reading during one visit or you have normal blood pressure with other risk factors, you may be asked to: Return on a different day to have your blood pressure checked again. Monitor your blood pressure at home for 1 week or longer. If you are diagnosed with hypertension, you may have other blood or imaging tests to help your health care provider understand your overall risk for other conditions. How is this treated? This condition is treated by making healthy lifestyle changes, such as eating healthy foods, exercising more, and reducing your alcohol intake. You may be referred for counseling on a healthy diet and physical activity. Your health care provider may prescribe medicine if lifestyle changes are not enough to get your blood pressure under control and if: Your systolic blood pressure is above 130. Your diastolic blood pressure is above 80. Your personal target blood pressure may vary depending on your medical conditions, your age, and other factors. Follow these instructions at home: Eating and drinking  Eat a diet that is high in fiber and potassium, and low in sodium, added sugar, and fat. An example of this eating plan is called the DASH diet. DASH stands for Dietary Approaches to Stop Hypertension. To eat this way: Eat  plenty of fresh fruits and vegetables. Try to fill one half of your plate at each meal with fruits and vegetables. Eat whole grains, such as whole-wheat pasta, brown rice, or whole-grain bread. Fill about one  fourth of your plate with whole grains. Eat or drink low-fat dairy products, such as skim milk or low-fat yogurt. Avoid fatty cuts of meat, processed or cured meats, and poultry with skin. Fill about one fourth of your plate with lean proteins, such as fish, chicken without skin, beans, eggs, or tofu. Avoid pre-made and processed foods. These tend to be higher in sodium, added sugar, and fat. Reduce your daily sodium intake. Many people with hypertension should eat less than 1,500 mg of sodium a day. Do not drink alcohol if: Your health care provider tells you not to drink. You are pregnant, may be pregnant, or are planning to become pregnant. If you drink alcohol: Limit how much you have to: 0-1 drink a day for women. 0-2 drinks a day for men. Know how much alcohol is in your drink. In the [place] one drink equals one 12 oz bottle of beer (355 mL), one 5 oz glass of wine (148 mL), or one 1 oz glass of hard liquor (44 mL). Lifestyle  Work with your health care provider to maintain a healthy body weight or to lose weight. Ask what an ideal weight is for you. Get at least 30 minutes of exercise that causes your heart to beat faster (aerobic exercise) most days of the week. Activities may include walking, swimming, or biking. Include exercise to strengthen your muscles (resistance exercise), such as Pilates or lifting weights, as part of your weekly exercise routine. Try to do these types of exercises for 30 minutes at least 3 days a week. Do not use any products that contain nicotine or tobacco. These products include cigarettes, chewing tobacco, and vaping devices, such as e-cigarettes. If you need help quitting, ask your health care provider. Monitor your blood pressure at home as told by your health care provider. Keep all follow-up visits. This is important. Medicines Take over-the-counter and prescription medicines only as told by your health care provider. Follow directions carefully. Blood  pressure medicines must be taken as prescribed. Do not skip doses of blood pressure medicine. Doing this puts you at risk for problems and can make the medicine less effective. Ask your health care provider about side effects or reactions to medicines that you should watch for. Contact a health care provider if you: Think you are having a reaction to a medicine you are taking. Have headaches that keep coming back (recurring). Feel dizzy. Have swelling in your ankles. Have trouble with your vision. Get help right away if you: Develop a severe headache or confusion. Have unusual weakness or numbness. Feel faint. Have severe pain in your chest or abdomen. Vomit repeatedly. Have trouble breathing. These symptoms may be an emergency. Get help right away. Call 911. Do not wait to see if the symptoms will go away. Do not drive yourself to the hospital. Summary Hypertension is when the force of blood pumping through your arteries is too strong. If this condition is not controlled, it may put you at risk for serious complications. Your personal target blood pressure may vary depending on your medical conditions, your age, and other factors. For most people, a normal blood pressure is less than 120/80. Hypertension is treated with lifestyle changes, medicines, or a combination of both. Lifestyle changes include losing weight, eating a healthy,  low-sodium diet, exercising more, and limiting alcohol. This information is not intended to replace advice given to you by your health care provider. Make sure you discuss any questions you have with your health care provider. Document Revised: 03/19/2021 Document Reviewed: 03/19/2021 Elsevier Patient Education  2024 [employer].

## 2023-09-28 ENCOUNTER — Ambulatory Visit

## 2023-09-28 DIAGNOSIS — Z0000 Encounter for general adult medical examination without abnormal findings: Secondary | ICD-10-CM | POA: Diagnosis not present

## 2023-09-28 DIAGNOSIS — R319 Hematuria, unspecified: Secondary | ICD-10-CM | POA: Insufficient documentation

## 2023-09-28 NOTE — Progress Notes (Signed)
 Subjective:   Deborah Lowe is a 83 y.o. who presents for a Medicare Wellness preventive visit.  Visit Complete: Virtual I connected with  Deborah Lowe on 09/28/23 by a audio enabled telemedicine application and verified that I am speaking with the correct person using two identifiers.  Patient Location: Home  Provider Location: Home Office  I discussed the limitations of evaluation and management by telemedicine. The patient expressed understanding and agreed to proceed.  Vital Signs: Because this visit was a virtual/telehealth visit, some criteria may be missing or patient reported. Any vitals not documented were not able to be obtained and vitals that have been documented are patient reported.  VideoDeclined- This patient declined Librarian, academic. Therefore the visit was completed with audio only.  Persons Participating in Visit: Patient.  AWV Questionnaire: No: Patient Medicare AWV questionnaire was not completed prior to this visit.  Cardiac Risk Factors include: advanced age (>20men, >75 women)     Objective:    There were no vitals filed for this visit. There is no height or weight on file to calculate BMI.     09/28/2023    2:53 PM 09/05/2022    6:39 PM 08/07/2022    6:26 PM 07/30/2022   11:39 AM 05/28/2021    9:09 AM 05/15/2016   11:41 AM 11/16/2014   11:31 AM  Advanced Directives  Does Patient Have a Medical Advance Directive? No No No No No No No  Would patient like information on creating a medical advance directive?  No - Patient declined No - Patient declined Yes (MAU/Ambulatory/Procedural Areas - Information given) No - Patient declined      Current Medications (verified) Outpatient Encounter Medications as of 09/28/2023  Medication Sig   alendronate  (FOSAMAX ) 70 MG tablet Take 1 tablet (70 mg total) by mouth once a week. Take with a full glass of water on an empty stomach.   amLODipine  (NORVASC ) 5 MG tablet Take 1 tablet (5 mg  total) by mouth daily.   apixaban  (ELIQUIS ) 2.5 MG TABS tablet Take 1 tablet (2.5 mg total) by mouth 2 (two) times daily.   calcium-vitamin D  (OSCAL WITH D) 250-125 MG-UNIT tablet Take 1 tablet by mouth daily.   doxazosin  (CARDURA ) 2 MG tablet Take 2 tablets (4 mg total) by mouth daily.   escitalopram  (LEXAPRO ) 5 MG tablet Take 1 tablet (5 mg total) by mouth daily.   fluticasone  (FLONASE ) 50 MCG/ACT nasal spray Place 2 sprays into both nostrils daily.   hydrALAZINE  (APRESOLINE ) 25 MG tablet TAKE ONE (1) TABLET BY MOUTH AS NEEDED FOR > 170 X 2 , 20 MINUTES APART.   KLOR-CON  M10 10 MEQ tablet TAKE ONE (1) TABLET (10 MEQ) EVERY DAY AS NEEDED FOR SWELLING WITH LASIX .   metoprolol  succinate (TOPROL  XL) 25 MG 24 hr tablet Take 1 tablet (25 mg total) by mouth 2 (two) times daily.   valsartan  (DIOVAN ) 160 MG tablet Take 1 tablet (160 mg total) by mouth daily.   furosemide  (LASIX ) 20 MG tablet Take 1 tablet (20 mg total) by mouth daily.   No facility-administered encounter medications on file as of 09/28/2023.    Allergies (verified) Avelox [moxifloxacin hcl in nacl]   History: Past Medical History:  Diagnosis Date   Breast cancer (HCC) 05/26/2010   s/p lumpectomy of left breast with XRT   Hypertension    Palpitations    on beta blocker therapy   Paroxysmal atrial fibrillation (HCC) 08/12/2022   TTE 09/10/22: EF  60-65, no RWMA, NL RVSF, NL PASP, RVSP 30.9, mild LAE, trivial MR, trivial AI, RAP 3   Ringing in ears    Wears glasses    Past Surgical History:  Procedure Laterality Date   BREAST LUMPECTOMY Left    BREAST SURGERY     CARDIAC CATHETERIZATION  01/29/2007   CARDIOVASCULAR STRESS TEST  07/19/2002   EF 84%   CHOLECYSTECTOMY     TEAR DUCT PROBING     Family History  Problem Relation Age of Onset   Colon cancer Mother    Heart disease Father    Bone cancer Brother    Social History   Socioeconomic History   Marital status: Married    Spouse name: Blaise Bumps   Number of children: 1    Years of education: 12   Highest education level: Not on file  Occupational History   Occupation: retired  Tobacco Use   Smoking status: Never   Smokeless tobacco: Never  Vaping Use   Vaping status: Never Used  Substance and Sexual Activity   Alcohol use: No   Drug use: No   Sexual activity: Yes    Birth control/protection: Post-menopausal  Other Topics Concern   Not on file  Social History Narrative   One daughter   Social Drivers of Health   Financial Resource Strain: Low Risk  (09/28/2023)   Overall Financial Resource Strain (CARDIA)    Difficulty of Paying Living Expenses: Not hard at all  Food Insecurity: No Food Insecurity (09/28/2023)   Hunger Vital Sign    Worried About Running Out of Food in the Last Year: Never true    Ran Out of Food in the Last Year: Never true  Transportation Needs: No Transportation Needs (09/28/2023)   PRAPARE - Administrator, Civil Service (Medical): No    Lack of Transportation (Non-Medical): No  Physical Activity: Insufficiently Active (09/28/2023)   Exercise Vital Sign    Days of Exercise per Week: 4 days    Minutes of Exercise per Session: 30 min  Stress: No Stress Concern Present (09/28/2023)   [name] of Occupational Health - Occupational Stress Questionnaire    Feeling of Stress : Not at all  Social Connections: Moderately Integrated (09/28/2023)   Social Connection and Isolation Panel [NHANES]    Frequency of Communication with Friends and Family: More than three times a week    Frequency of Social Gatherings with Friends and Family: More than three times a week    Attends Religious Services: More than 4 times per year    Active Member of [employer] or Organizations: No    Attends Banker Meetings: Never    Marital Status: Married    Tobacco Counseling Counseling given: Not Answered    Clinical Intake:  Pre-visit preparation completed: Yes  Pain : No/denies pain     Nutritional Risks:  None Diabetes: No  Lab Results  Component Value Date   HGBA1C 5.6 05/30/2013     How often do you need to have someone help you when you read instructions, pamphlets, or other written materials from your doctor or pharmacy?: 1 - Never  Interpreter Needed?: No  Information entered by :: Anola Basques T/CMA   Activities of Daily Living     09/28/2023    2:50 PM  In your present state of health, do you have any difficulty performing the following activities:  Hearing? 1  Vision? 0  Difficulty concentrating or making decisions? 0  Walking or climbing  stairs? 0  Dressing or bathing? 0  Doing errands, shopping? 0  Preparing Food and eating ? N  Using the Toilet? N  In the past six months, have you accidently leaked urine? Y  Do you have problems with loss of bowel control? N  Managing your Medications? N  Managing your Finances? N  Housekeeping or managing your Housekeeping? N    Patient Care Team: Yevette Hem, FNP as PCP - General (Family Medicine) Nahser, Lela Purple, MD as PCP - Cardiology (Cardiology) Woodrow Hazy, MD as Consulting Physician (Obstetrics and Gynecology) Ashok Blake, DPM as Consulting Physician (Podiatry) Beulah Brunt, MD as Consulting Physician (Dermatology) Debbie Fails, Laura Polio, NP as Nurse Practitioner (Hematology and Oncology) Sherwood Donath as Physician Assistant (Cardiology)  Indicate any recent Medical Services you may have received from other than [facility] providers in the past year (date may be approximate).     Assessment:   This is a routine wellness examination for Deborah Lowe.  Hearing/Vision screen No results found.   Goals Addressed             This Visit's Progress    Exercise 150 min/wk Moderate Activity   On track    Exercise daily Be consistent       Depression Screen     09/28/2023    2:51 PM 08/25/2023   10:11 AM 07/29/2023    9:42 AM 04/27/2023   11:52 AM 04/14/2023   12:14 PM 01/02/2023   10:55 AM 10/17/2022     9:44 AM  PHQ 2/9 Scores  PHQ - 2 Score 0 0 1 0 1 2 2   PHQ- 9 Score 2  2 1 3 3 5     Fall Risk     09/28/2023    2:50 PM 07/29/2023    9:42 AM 04/27/2023   11:52 AM 04/14/2023   12:14 PM 01/02/2023   10:55 AM  Fall Risk   Falls in the past year? 0 0 1 0 0  Number falls in past yr: 0  0 0   Injury with Fall? 0  1 0   Risk for fall due to : No Fall Risks No Fall Risks Impaired balance/gait No Fall Risks   Follow up Falls prevention discussed;Falls evaluation completed Falls evaluation completed Falls evaluation completed  Falls prevention discussed    MEDICARE RISK AT HOME:  Medicare Risk at Home Any stairs in or around the home?: No If so, are there any without handrails?: No Home free of loose throw rugs in walkways, pet beds, electrical cords, etc?: Yes Adequate lighting in your home to reduce risk of falls?: Yes Life alert?: No Use of a cane, walker or w/c?: No Grab bars in the bathroom?: Yes Shower chair or bench in shower?: No Elevated toilet seat or a handicapped toilet?: Yes  TIMED UP AND GO:  Was the test performed?  no  Cognitive Function: 6CIT completed        09/28/2023    2:57 PM 07/30/2022   11:39 AM 05/28/2021    9:18 AM  6CIT Screen  What Year? 0 points 0 points 0 points  What month? 0 points 0 points 0 points  What time? 0 points 0 points 0 points  Count back from 20 0 points 0 points 0 points  Months in reverse 0 points 0 points 0 points  Repeat phrase 0 points 0 points 6 points  Total Score 0 points 0 points 6 points    Immunizations Immunization History  Administered Date(s) Administered   Fluad Quad(high Dose 65+) 03/17/2019, 03/27/2020   Influenza, High Dose Seasonal PF 04/22/2017, 03/10/2018, 03/17/2022   Influenza-Unspecified 04/22/2017, 03/10/2018, 04/01/2021, 03/17/2022, 02/24/2023   Moderna Covid-19 Fall Seasonal Vaccine 58yrs & older 02/26/2022   Moderna Covid-19 Vaccine Bivalent Booster 23yrs & up 04/01/2021   Moderna SARS-COV2 Booster  Vaccination 10/15/2020   PFIZER Comirnaty(Gray Top)Covid-19 Tri-Sucrose Vaccine 10/15/2020   PFIZER(Purple Top)SARS-COV-2 Vaccination 06/16/2019, 07/07/2019, 02/18/2020   Pneumococcal Conjugate-13 03/19/2015   Pneumococcal Polysaccharide-23 03/31/2016, 04/01/2016   Respiratory Syncytial Virus Vaccine,Recomb Aduvanted(Arexvy) 03/25/2023   Unspecified SARS-COV-2 Vaccination 06/16/2019, 07/07/2019, 02/18/2020    Screening Tests Health Maintenance  Topic Date Due   Zoster Vaccines- Shingrix (1 of 2) 11/24/2023 (Originally 03/20/1960)   DTaP/Tdap/Td (1 - Tdap) 04/13/2024 (Originally 03/20/1960)   COVID-19 Vaccine (10 - 2024-25 season) 10/13/2024 (Originally 01/25/2023)   INFLUENZA VACCINE  12/25/2023   Medicare Annual Wellness (AWV)  09/27/2024   DEXA SCAN  08/24/2025   Pneumonia Vaccine 17+ Years old  Completed   HPV VACCINES  Aged Out   Meningococcal B Vaccine  Aged Out   Colonoscopy  Discontinued   Hepatitis C Screening  Discontinued    Health Maintenance  There are no preventive care reminders to display for this patient.  Health Maintenance Items Addressed: See nurse notes  Additional Screening:  Vision Screening: Recommended annual ophthalmology exams for early detection of glaucoma and other disorders of the eye.  Dental Screening: Recommended annual dental exams for proper oral hygiene  Community Resource Referral / Chronic Care Management: CRR required this visit?  No   CCM required this visit?  No     Plan:     I have personally reviewed and noted the following in the patient's chart:   Medical and social history Use of alcohol, tobacco or illicit drugs  Current medications and supplements including opioid prescriptions. Patient is not currently taking opioid prescriptions. Functional ability and status Nutritional status Physical activity Advanced directives List of other physicians Hospitalizations, surgeries, and ER visits in previous 12  months Vitals Screenings to include cognitive, depression, and falls Referrals and appointments  In addition, I have reviewed and discussed with patient certain preventive protocols, quality metrics, and best practice recommendations. A written personalized care plan for preventive services as well as general preventive health recommendations were provided to patient.     Michaelle Adolphus, CMA   09/28/2023   After Visit Summary: (MyChart) Due to this being a telephonic visit, the after visit summary with patients personalized plan was offered to patient via MyChart   Notes: Clinician Recommendations: Please remember to get your vaccines updated: Shingles and Tetanus.

## 2023-09-28 NOTE — Patient Instructions (Signed)
 Deborah Lowe , Thank you for taking time to come for your Medicare Wellness Visit. I appreciate your ongoing commitment to your health goals. Please review the following plan we discussed and let me know if I can assist you in the future.   Referrals/Orders/Follow-Ups/Clinician Recommendations: Please remember to get your vaccines updated: Shingles and Tetanus.  This is a list of the screening recommended for you and due dates:  Health Maintenance  Topic Date Due   Zoster (Shingles) Vaccine (1 of 2) 11/24/2023*   DTaP/Tdap/Td vaccine (1 - Tdap) 04/13/2024*   COVID-19 Vaccine (10 - 2024-25 season) 10/13/2024*   Flu Shot  12/25/2023   Medicare Annual Wellness Visit  09/27/2024   DEXA scan (bone density measurement)  08/24/2025   Pneumonia Vaccine  Completed   HPV Vaccine  Aged Out   Meningitis B Vaccine  Aged Out   Colon Cancer Screening  Discontinued   Hepatitis C Screening  Discontinued  *Topic was postponed. The date shown is not the original due date.    Advanced directives: (Declined) Advance directive discussed with you today. Even though you declined this today, please call our office should you change your mind, and we can give you the proper paperwork for you to fill out.  Next Medicare Annual Wellness Visit scheduled for next year: Yes  Have you seen your provider in the last 6 months (3 months if uncontrolled diabetes)? No, per pt not diabetic

## 2023-10-05 NOTE — Progress Notes (Unsigned)
 10/06/2023 Name: Deborah Lowe MRN: [id number] DOB: 06-03-1940  Chief Complaint  Patient presents with   Osteoporosis    Deborah Lowe is Lowe 83 y.o. year old female who presented for Lowe telephone visit.   They were referred to the pharmacist by their PCP for assistance in managing osteoporosis .   Subjective:  Care Team: Primary Care Provider: Yevette Hem, FNP ; Next Scheduled Visit: 01/15/24  Medication Access/Adherence  Current Pharmacy:  [facility] [phone] - [place], [place] - [address] [place] [address] Phone: [phone] Fax: [phone]   Patient reports affordability concerns with their medications: No  Patient reports access/transportation concerns to their pharmacy: No  Patient reports adherence concerns with their medications:  No     Osteoporosis:  Current medications: alendronate  70 mg once weekly  Patient reports she was on alendronate  previously, but stopped taking it due to an insurance issue. Was able to pick this up at the pharmacy last month with no issues and has been taking as prescribed. Denies any falls or fractures.  Current supplements: calcium 500-600 mg + vitamin D  daily (she takes 1 tablet daily)  Current physical activity: does sit ups in bed, toe touches, tries to walk 2-3 days per week for ~20 minutes at Lowe time  Most recent DEXA: 08/25/23 FINDINGS: Scan quality: Good.   LUMBAR SPINE (L1-L4):   BMD (in g/cm2): 0.768   Lowe-score: -3.4   Z-score: -1.2   LEFT FEMORAL NECK:   BMD (in g/cm2): 0.574   Lowe-score: -3.3   Z-score: -0.9   LEFT TOTAL HIP:   BMD (in g/cm2): 0.574   Lowe-score: -3.4   Z-score: -1.2   RIGHT FEMORAL NECK:   BMD (in g/cm2): 0.627   Lowe-score: -3.0   Z-score: -0.5   RIGHT TOTAL HIP:   BMD (in g/cm2): 0.619   Lowe-score: -3.1   Z-score: -0.8    Objective:  Lab Results  Component Value Date   HGBA1C 5.6 05/30/2013    Lab Results  Component Value Date    CREATININE 0.73 08/25/2023   BUN 13 08/25/2023   NA 138 08/25/2023   K 4.1 08/25/2023   CL 101 08/25/2023   CO2 24 08/25/2023    Lab Results  Component Value Date   CHOL 190 04/14/2023   HDL 63 04/14/2023   LDLCALC 114 (H) 04/14/2023   LDLDIRECT 140.5 12/07/2012   TRIG 73 04/14/2023   CHOLHDL 3.0 04/14/2023    Medications Reviewed Today     Reviewed by Deborah Lowe, RPH (Pharmacist) on 10/06/23 at 1411  Med List Status: <None>   Medication Order Taking? Sig Documenting Provider Last Dose Status Informant  alendronate  (FOSAMAX ) 70 MG tablet [id number] Yes Take 1 tablet (70 mg total) by mouth once Lowe week. Take with Lowe full glass of water on an empty stomach. Deborah Hem, FNP Taking Active   amLODipine  (NORVASC ) 5 MG tablet [id number]  Take 1 tablet (5 mg total) by mouth daily. Deborah Fragmin A, FNP  Active   apixaban  (ELIQUIS ) 2.5 MG TABS tablet [id number]  Take 1 tablet (2.5 mg total) by mouth 2 (two) times daily. Deborah Lowe  Active   Calcium Carbonate-Vitamin D  600-10 MG-MCG TABS [id number] Yes Take 1 tablet by mouth daily. [provider] Taking Active   doxazosin  (CARDURA ) 2 MG tablet [id number]  Take 2 tablets (4 mg total) by mouth daily. Deborah Single T, PA-C  Active   escitalopram  (LEXAPRO )  5 MG tablet [id number]  Take 1 tablet (5 mg total) by mouth daily. Deborah Fragmin A, FNP  Active   fluticasone  (FLONASE ) 50 MCG/ACT nasal spray [id number]  Place 2 sprays into both nostrils daily. Deborah Jules, FNP  Active   furosemide  (LASIX ) 20 MG tablet [id number]  Take 1 tablet (20 mg total) by mouth daily. Deborah Single T, PA-C  Expired 06/10/23 2359   hydrALAZINE  (APRESOLINE ) 25 MG tablet [id number]  TAKE ONE (1) TABLET BY MOUTH AS NEEDED FOR > 170 X 2 , 20 MINUTES APART. Deborah Single T, PA-C  Active   KLOR-CON  M10 10 MEQ tablet [id number]  TAKE ONE (1) TABLET (10 MEQ) EVERY DAY AS NEEDED FOR SWELLING WITH LASIX . Reyne Cave, Scott T, PA-C  Active   metoprolol   succinate (TOPROL  XL) 25 MG 24 hr tablet [id number]  Take 1 tablet (25 mg total) by mouth 2 (two) times daily. Deborah Single T, PA-C  Active   valsartan  (DIOVAN ) 160 MG tablet [id number]  Take 1 tablet (160 mg total) by mouth daily. Deborah Single T, PA-C  Active               Assessment/Plan:   Osteoporosis: - Currently appropriately managed. Discussed potential of switching to Prolia as she has been on alendronate  in the past, but patient prefers to stay on alendronate  due to cost.  - Reviewed recommendation for daily calcium intake of 1200 mg and vitamin D  intake of [phone] units. Recommended to increase her calcium/vitamin D  supplement to 2 tablets daily. Not currently on acid reducing medication. - Reviewed benefits of weight bearing exercise. Encouraged her to increase weight bearing exercise to at least 5 days per week. - Recommend to continue alendronate  70 mg once weekly.   Follow Up Plan: PCP on 01/15/24  Georga Killings, PharmD PGY-1 Pharmacy Resident  Marvell Slider, PharmD, BCACP, CPP Clinical Pharmacist, [facility]

## 2023-10-06 ENCOUNTER — Other Ambulatory Visit (INDEPENDENT_AMBULATORY_CARE_PROVIDER_SITE_OTHER): Admitting: Pharmacist

## 2023-10-06 DIAGNOSIS — M810 Age-related osteoporosis without current pathological fracture: Secondary | ICD-10-CM

## 2023-10-15 ENCOUNTER — Ambulatory Visit: Attending: Physician Assistant

## 2023-10-15 DIAGNOSIS — R030 Elevated blood-pressure reading, without diagnosis of hypertension: Secondary | ICD-10-CM

## 2023-10-15 NOTE — Progress Notes (Unsigned)
 24 hour ambulatory blood pressure monitor applied to patients left arm using standard adult cuff.

## 2023-10-20 DIAGNOSIS — R030 Elevated blood-pressure reading, without diagnosis of hypertension: Secondary | ICD-10-CM

## 2023-10-22 ENCOUNTER — Ambulatory Visit: Payer: Self-pay | Admitting: Physician Assistant

## 2023-10-22 MED ORDER — HYDRALAZINE HCL 25 MG PO TABS: 25.0000 mg | ORAL_TABLET | Freq: Three times a day (TID) | ORAL | Status: DC

## 2023-10-26 MED ORDER — HYDRALAZINE HCL 25 MG PO TABS
25.0000 mg | ORAL_TABLET | Freq: Three times a day (TID) | ORAL | 3 refills | Status: DC
Start: 2023-10-26 — End: 2024-01-24

## 2023-10-26 NOTE — Telephone Encounter (Incomplete Revision)
  Pt states she took the extra dose of medication  but now she feels dizzy and her BP has dropped to  88/40 68   Please call pt back on mobile number

## 2023-10-26 NOTE — Telephone Encounter (Signed)
 Please call pt back on mobile number

## 2023-10-28 MED ORDER — HYDRALAZINE HCL 25 MG PO TABS: ORAL_TABLET | ORAL | 11 refills | Status: AC

## 2023-11-02 NOTE — Telephone Encounter (Signed)
 Call placed to pt.  She has been made aware to change the Hydralazine  back to PRN for systolic bp over 914.  Pt verbalized understanding.

## 2023-11-09 DIAGNOSIS — K219 Gastro-esophageal reflux disease without esophagitis: Secondary | ICD-10-CM | POA: Diagnosis not present

## 2023-11-09 DIAGNOSIS — R197 Diarrhea, unspecified: Secondary | ICD-10-CM | POA: Diagnosis not present

## 2023-11-19 DIAGNOSIS — H9113 Presbycusis, bilateral: Secondary | ICD-10-CM | POA: Diagnosis not present

## 2023-11-25 DIAGNOSIS — Z1231 Encounter for screening mammogram for malignant neoplasm of breast: Secondary | ICD-10-CM | POA: Diagnosis not present

## 2023-11-29 ENCOUNTER — Other Ambulatory Visit: Payer: Self-pay | Admitting: Physician Assistant

## 2023-12-08 ENCOUNTER — Encounter: Payer: Self-pay | Admitting: *Deleted

## 2023-12-09 ENCOUNTER — Ambulatory Visit: Attending: Physician Assistant | Admitting: Physician Assistant

## 2023-12-09 ENCOUNTER — Encounter: Payer: Self-pay | Admitting: Physician Assistant

## 2023-12-09 VITALS — BP 144/60 | HR 51 | Ht 61.0 in | Wt 116.0 lb

## 2023-12-09 DIAGNOSIS — I10 Essential (primary) hypertension: Secondary | ICD-10-CM | POA: Diagnosis not present

## 2023-12-09 DIAGNOSIS — I480 Paroxysmal atrial fibrillation: Secondary | ICD-10-CM | POA: Diagnosis not present

## 2023-12-09 DIAGNOSIS — I2510 Atherosclerotic heart disease of native coronary artery without angina pectoris: Secondary | ICD-10-CM | POA: Diagnosis present

## 2023-12-09 MED ORDER — APIXABAN 2.5 MG PO TABS: 2.5000 mg | ORAL_TABLET | Freq: Two times a day (BID) | ORAL | 3 refills | Status: AC

## 2023-12-09 MED ORDER — METOPROLOL SUCCINATE ER 25 MG PO TB24: 25.0000 mg | ORAL_TABLET | Freq: Two times a day (BID) | ORAL | 3 refills | Status: DC

## 2023-12-09 MED ORDER — DOXAZOSIN MESYLATE 2 MG PO TABS: 4.0000 mg | ORAL_TABLET | Freq: Every day | ORAL | 3 refills | Status: AC

## 2023-12-09 MED ORDER — POTASSIUM CHLORIDE CRYS ER 10 MEQ PO TBCR: 10.0000 meq | EXTENDED_RELEASE_TABLET | Freq: Every day | ORAL | 3 refills | Status: AC

## 2023-12-09 MED ORDER — FUROSEMIDE 20 MG PO TABS: 20.0000 mg | ORAL_TABLET | Freq: Every day | ORAL | 3 refills | Status: AC

## 2023-12-09 MED ORDER — VALSARTAN 160 MG PO TABS: 160.0000 mg | ORAL_TABLET | Freq: Every day | ORAL | 3 refills | Status: DC

## 2023-12-09 NOTE — Progress Notes (Signed)
 OFFICE NOTE:    Date:  12/09/2023  ID:  Deborah Lowe, DOB 07/22/1940, MRN [id number] PCP: Deborah Bari DELENA, FNP  [facility] Providers Cardiologist:  Deborah Lowe Decent, MD Cardiology APP:  Deborah Deborah ONEIDA, PA-C       Paroxysmal atrial fibrillation TTE 09/10/2022: EF 60-65, no RWMA, normal RVSF, normal PASP, RVSP 30.9, mild LAE, trivial MR, trivial AI, RAP 3 Nonobstructive coronary artery disease  Normal coronary arteries (cath 01/2007)  CCTA 08/19/2022: CAC score 28 (31st percentile), minimal nonobstructive CAD-LAD proximal 1-24, LCx mid 1-24; aortic atherosclerosis, aorta 33 mm Palpitations  Hypertension  Hyperlipidemia  Breast CA s/p lumpectomy (L breast)         Discussed the use of AI scribe software for clinical note transcription with the patient, who gave verbal consent to proceed. History of Present Illness Deborah Lowe is a 83 y.o. female who returns for follow up of HTN, AF, CAD. She was last seen in 05/2023. BP was fluctuating some and I obtained a 24 hour ambulatory BP monitor. Her BP was s/w above target. I added Hydralazine  25 three times a day. But she had symptomatic hypotension with this and we stopped it.   She is here alone.  Overall, she is doing well.  She has not taken hydralazine  recently. Her blood pressure at home is generally better than in the clinic.  She has not had chest pain, pressure, tightness, heaviness, shortness of breath, syncope, or leg swelling.    Review of Systems  Gastrointestinal:  Negative for hematochezia and melena.  Genitourinary:  Negative for hematuria.  -See HPI    Studies Reviewed:  EKG Interpretation Date/Time:  Wednesday December 09 2023 14:49:25 EDT Ventricular Rate:  51 PR Interval:  154 QRS Duration:  78 QT Interval:  426 QTC Calculation: 392 R Axis:   100  Text Interpretation: Sinus bradycardia with sinus arrhythmia Rightward axis Low voltage QRS Confirmed by Deborah Deborah [phone]) on 12/09/2023 3:04:52 PM     Recent Labs    08/25/23 1029  K 4.1  CREATININE 0.73  ALT 12  HGB 12.9    24 Hr BP Monitor 5/06/28/23 24 hour BP readings  Systolic BP ranges 115 - 191   Mean 137 Diastolic BP readings 40 - 83 with mean of 55 Results LABS Hb: 12.9 (08/25/2023) Cr: 1.1 (09/09/2023) LDL: 114 (04/14/2023)  Risk Assessment/Calculations: CHA2DS2-VASc Score = 5   This indicates a 7.2% annual risk of stroke. The patient's score is based upon: CHF History: 0 HTN History: 1 Diabetes History: 0 Stroke History: 0 Vascular Disease History: 1 Age Score: 2 Gender Score: 1    HYPERTENSION CONTROL Vitals:   12/09/23 1454 12/09/23 1522  BP: (!) 158/50 (!) 144/60    The patient's blood pressure is elevated above target today.  In order to address the patient's elevated BP: The blood pressure is usually elevated in clinic.  Blood pressures monitored at home have been optimal.; Blood pressure will be monitored at home to determine if medication changes need to be made.         Physical Exam:  VS:  BP (!) 144/60   Pulse (!) 51   Ht 5' 1 (1.549 m)   Wt 116 lb (52.6 kg)   SpO2 94%   BMI 21.92 kg/m        Wt Readings from Last 3 Encounters:  12/09/23 116 lb (52.6 kg)  09/14/23 117 lb (53.1 kg)  08/25/23 118 lb 6.4 oz (  53.7 kg)    Constitutional:      Appearance: Healthy appearance. Not in distress.  Neck:     Vascular: JVD normal.  Pulmonary:     Breath sounds: Normal breath sounds. No wheezing. No rales.  Cardiovascular:     Normal rate. Regular rhythm.     Murmurs: There is no murmur.  Edema:    Peripheral edema absent.  Abdominal:     Palpations: Abdomen is soft.       Assessment and Plan:    Assessment & Plan Paroxysmal atrial fibrillation (HCC) Maintaining sinus rhythm. Tolerating anticoagulation well. Heart rates sometimes in the forties and fifties, but asymptomatic with this. - Continue Eliquis  2.5 mg twice daily. - Monitor for symptoms of bradycardia and contact us   if symptoms occur. Essential hypertension Blood pressure tends to run high in clinic but is fairly optimal at home.  She developed symptomatic hypotension in the past with increased hydralazine  dosing.  This has been changed to as needed dosing.  We may have to tolerate higher blood pressures for her.. - Continue amlodipine  5 mg daily. - Continue Cardura  4 mg daily. - Continue Lasix  20 mg daily. - Use hydralazine  25 mg as needed for elevated blood pressure. - Continue metoprolol  succinate 25 mg twice daily. - Continue valsartan  160 mg daily. Coronary artery disease involving native coronary artery of native heart without angina pectoris Minimal nonobstructive CAD by CCTA in March 2024.  She has not had anginal symptoms.  She is not on antiplatelet therapy as she is on Eliquis .  She has been hesitant to start cholesterol medications in the past.       Dispo:  Return in about 6 months (around 06/10/2024) for Routine Follow Up w/ Deborah Lowe.  Signed, Deborah Ferrier, PA-C

## 2023-12-09 NOTE — Assessment & Plan Note (Signed)
 Minimal nonobstructive CAD by CCTA in March 2024.  She has not had anginal symptoms.  She is not on antiplatelet therapy as she is on Eliquis .  She has been hesitant to start cholesterol medications in the past.

## 2023-12-09 NOTE — Assessment & Plan Note (Signed)
 Maintaining sinus rhythm. Tolerating anticoagulation well. Heart rates sometimes in the forties and fifties, but asymptomatic with this. - Continue Eliquis  2.5 mg twice daily. - Monitor for symptoms of bradycardia and contact us  if symptoms occur.

## 2023-12-09 NOTE — Patient Instructions (Signed)
 Medication Instructions:  Your physician recommends that you continue on your current medications as directed. Please refer to the Current Medication list given to you today.  *If you need a refill on your cardiac medications before your next appointment, please call your pharmacy*  Lab Work: None ordered  If you have labs (blood work) drawn today and your tests are completely normal, you will receive your results only by: MyChart Message (if you have MyChart) OR A paper copy in the mail If you have any lab test that is abnormal or we need to change your treatment, we will call you to review the results.  Testing/Procedures: None ordered  Follow-Up: At [facility], you and your health needs are our priority.  As part of our continuing mission to provide you with exceptional heart care, our providers are all part of one team.  This team includes your primary Cardiologist (physician) and Advanced Practice Providers or APPs (Physician Assistants and Nurse Practitioners) who all work together to provide you with the care you need, when you need it.  Your next appointment:   6 month(s)  Provider:   Darryle ONEIDA Decent, MD    We recommend signing up for the patient portal called MyChart.  Sign up information is provided on this After Visit Summary.  MyChart is used to connect with patients for Virtual Visits (Telemedicine).  Patients are able to view lab/test results, encounter notes, upcoming appointments, etc.  Non-urgent messages can be sent to your provider as well.   To learn more about what you can do with MyChart, go to [email or web address].   Other Instructions

## 2023-12-09 NOTE — Assessment & Plan Note (Signed)
 Blood pressure tends to run high in clinic but is fairly optimal at home.  She developed symptomatic hypotension in the past with increased hydralazine  dosing.  This has been changed to as needed dosing.  We may have to tolerate higher blood pressures for her.. - Continue amlodipine  5 mg daily. - Continue Cardura  4 mg daily. - Continue Lasix  20 mg daily. - Use hydralazine  25 mg as needed for elevated blood pressure. - Continue metoprolol  succinate 25 mg twice daily. - Continue valsartan  160 mg daily.

## 2023-12-21 DIAGNOSIS — H9313 Tinnitus, bilateral: Secondary | ICD-10-CM | POA: Diagnosis not present

## 2023-12-21 DIAGNOSIS — H903 Sensorineural hearing loss, bilateral: Secondary | ICD-10-CM | POA: Diagnosis not present

## 2023-12-25 ENCOUNTER — Other Ambulatory Visit: Payer: Self-pay | Admitting: Family

## 2023-12-25 DIAGNOSIS — F419 Anxiety disorder, unspecified: Secondary | ICD-10-CM

## 2024-01-06 ENCOUNTER — Ambulatory Visit: Payer: Self-pay

## 2024-01-06 ENCOUNTER — Ambulatory Visit (INDEPENDENT_AMBULATORY_CARE_PROVIDER_SITE_OTHER): Admitting: Nurse Practitioner

## 2024-01-06 ENCOUNTER — Encounter: Payer: Self-pay | Admitting: Nurse Practitioner

## 2024-01-06 VITALS — BP 161/60 | HR 53 | Temp 97.2°F | Ht 61.0 in | Wt 118.6 lb

## 2024-01-06 DIAGNOSIS — J029 Acute pharyngitis, unspecified: Secondary | ICD-10-CM | POA: Diagnosis not present

## 2024-01-06 DIAGNOSIS — J069 Acute upper respiratory infection, unspecified: Secondary | ICD-10-CM | POA: Diagnosis not present

## 2024-01-06 LAB — RAPID STREP SCREEN (MED CTR MEBANE ONLY): Strep Gp A Ag, IA W/Reflex: NEGATIVE

## 2024-01-06 LAB — CULTURE, GROUP A STREP

## 2024-01-06 NOTE — Progress Notes (Signed)
 Acute Office Visit  Subjective:     Patient ID: Deborah Lowe, female    DOB: [birth year], 83 y.o.   MRN: [id number]  Chief Complaint  Patient presents with   Sore Throat    Symptoms started yesterday    Nasal Congestion    HPI Deborah Lowe is an 83 year old female who presents on January 06, 2024, for an acute visit with symptoms of an upper respiratory infection. She reports congestion and sore throat for the past 2 days, with no improvement. She has been drinking plenty of fluids. No prior evaluation has been done. She has been taking Tylenol with minimal relief. Took at home COVIDtest and was negative   Active Ambulatory Problems    Diagnosis Date Noted   Essential hypertension 08/13/2011   History of left breast cancer 05/28/2021   Punctal stenosis, acquired 10/13/2013   Osteoporosis 05/28/2021   Pseudophakia of both eyes 09/05/2014   Thyroid  mass 07/30/2015   Pure hypercholesterolemia 01/17/2022   Other fatigue 01/17/2022   Anxiety 05/13/2022   Paroxysmal atrial fibrillation (HCC) 08/12/2022   Precordial chest pain 08/12/2022   CAD (coronary artery disease) 10/10/2022   Pleural effusion 10/10/2022   Bilateral lower extremity edema 10/10/2022   Blood in urine 09/28/2023   Sorethroat 01/06/2024   Resolved Ambulatory Problems    Diagnosis Date Noted   Carcinoma of upper-outer quadrant of left female breast (HCC) 02/20/2011   Headache 03/07/2013   Palpitations 01/16/2021   Cerumen impaction 07/30/2015   Tearing 10/13/2013   Elevated TSH 08/12/2022   Dysuria 04/27/2023   Acute cystitis without hematuria 04/27/2023   Past Medical History:  Diagnosis Date   Breast cancer (HCC) 05/26/2010   Hypertension    Ringing in ears    Wears glasses     Review of Systems  Constitutional:  Negative for chills and fever.  HENT:  Positive for congestion and sore throat.   Respiratory:  Negative for cough, shortness of breath and wheezing.   Cardiovascular:  Negative  for chest pain and palpitations.  Neurological:  Negative for dizziness and headaches.   Negative unless indicated in HPI    Objective:    BP (!) 161/60   Pulse (!) 53   Temp (!) 97.2 F (36.2 C) (Temporal)   Ht 5' 1 (1.549 m)   Wt 118 lb 9.6 oz (53.8 kg)   SpO2 97%   BMI 22.41 kg/m     Physical Exam Vitals and nursing note reviewed.  Constitutional:      General: She is not in acute distress. HENT:     Head: Normocephalic and atraumatic.     Right Ear: Tympanic membrane, ear canal and external ear normal. There is no impacted cerumen.     Left Ear: Tympanic membrane, ear canal and external ear normal. There is no impacted cerumen.     Nose: Congestion present.     Mouth/Throat:     Mouth: Mucous membranes are moist.  Eyes:     General: No scleral icterus.    Extraocular Movements: Extraocular movements intact.     Conjunctiva/sclera: Conjunctivae normal.     Pupils: Pupils are equal, round, and reactive to light.  Cardiovascular:     Heart sounds: Normal heart sounds.  Pulmonary:     Effort: Pulmonary effort is normal.     Breath sounds: Normal breath sounds.  Musculoskeletal:        General: Normal range of motion.     Right lower leg: No  edema.     Left lower leg: No edema.  Skin:    General: Skin is warm and dry.     Findings: No rash.  Neurological:     Mental Status: She is alert and oriented to person, place, and time.  Psychiatric:        Mood and Affect: Mood normal.        Behavior: Behavior normal.        Thought Content: Thought content normal.        Judgment: Judgment normal.    Pertinent labs & imaging results that were available during my care of the patient were reviewed by me and considered in my medical decision making.  Results for orders placed or performed in visit on 01/06/24  Rapid Strep Screen ([facility] ONLY)   Specimen: Other   Other  Result Value Ref Range   Strep Gp A Ag, IA W/Reflex Negative Negative  Culture, Group A  Strep   Other  Result Value Ref Range   Strep A Culture CANCELED         Assessment & Plan:  Sorethroat -     Rapid Strep Screen ([facility] ONLY) -     Culture, Group A Strep  Viral URI -     Rapid Strep Screen ([facility] ONLY)   Deborah Lowe is a 83 year old Caucasian female seen today for URI symptoms, no acute distress  Congestion: continue Flonase  as prescribed POC strep: negative -supportive care: Continue adequate hydration, rest, and use of Tylenol for symptom relief as needed. -Symptom management: Consider a saline nasal spray or a humidifier for congestion. Throat lozenges or warm saltwater gargles may help sore throat. -Monitoring: Watch for red flag symptoms such as fever >101F, shortness of breath, worsening cough, chest pain, or confusion. Seek care immediately if these occur. -Medication review: No antibiotics indicated at this time as symptoms are consistent with a viral URI. - Return to clinic if symptoms persist beyond 7-10 days, worsen, or new symptoms develop. Return if symptoms worsen or fail to improve.  Jerico Grisso [place] Thompson, DNP [facility] [address] [place], [place] [address] [phone]  Note: This document was prepared by [employer] voice dictation technology and any errors that results from this process are unintentional.

## 2024-01-06 NOTE — Telephone Encounter (Signed)
 FYI Only or Action Required?: Action required by provider: request for appointment.  Patient was last seen in primary care on 09/14/2023 by Lavell Bari LABOR, FNP.  Called Nurse Triage reporting Sore Throat.  Symptoms began several days ago.  Interventions attempted: Rest, hydration, or home remedies.  Symptoms are: gradually worsening.   Triage Disposition: See Physician Within 24 Hours  Patient/caregiver understands and will follow disposition?: Yes   Copied from CRM [id number]. Topic: Clinical - Red Word Triage >> Jan 06, 2024  8:04 AM Rosaria BRAVO wrote: Red Word that prompted transfer to Nurse Triage: Difficulty swallowing, sore throat    ----------------------------------------------------------------------- From previous Reason for Contact - Scheduling: Patient/patient representative is calling to schedule an appointment. Refer to attachments for appointment information. Reason for Disposition  SEVERE throat pain (e.g., excruciating)  Answer Assessment - Initial Assessment Questions 1. ONSET: When did the throat start hurting? (Hours or days ago)      Monday 2. SEVERITY: How bad is the sore throat? (Scale 1-10; mild, moderate or severe)     severe 3. STREP EXPOSURE: Has there been any exposure to strep within the past week? If Yes, ask: What type of contact occurred?      no 4.  VIRAL SYMPTOMS: Are there any symptoms of a cold, such as a runny nose, cough, hoarse voice or red eyes?      Runny nose, COVID negative 5. FEVER: Do you have a fever? If Yes, ask: What is your temperature, how was it measured, and when did it start?     no 6. PUS ON THE TONSILS: Is there pus on the tonsils in the back of your throat?     no 7. OTHER SYMPTOMS: Do you have any other symptoms? (e.g., difficulty breathing, headache, rash)     no 8. PREGNANCY: Is there any chance you are pregnant? When was your last menstrual period?     no  Protocols used: Sore  Throat-A-AH

## 2024-01-11 DIAGNOSIS — L578 Other skin changes due to chronic exposure to nonionizing radiation: Secondary | ICD-10-CM | POA: Diagnosis not present

## 2024-01-11 DIAGNOSIS — D225 Melanocytic nevi of trunk: Secondary | ICD-10-CM | POA: Diagnosis not present

## 2024-01-11 DIAGNOSIS — L821 Other seborrheic keratosis: Secondary | ICD-10-CM | POA: Diagnosis not present

## 2024-01-15 ENCOUNTER — Encounter: Payer: Self-pay | Admitting: Family

## 2024-01-15 ENCOUNTER — Ambulatory Visit (INDEPENDENT_AMBULATORY_CARE_PROVIDER_SITE_OTHER): Admitting: Family

## 2024-01-15 VITALS — BP 128/55 | HR 78 | Temp 97.3°F | Ht 61.0 in | Wt 117.0 lb

## 2024-01-15 DIAGNOSIS — I2510 Atherosclerotic heart disease of native coronary artery without angina pectoris: Secondary | ICD-10-CM

## 2024-01-15 DIAGNOSIS — E7800 Pure hypercholesterolemia, unspecified: Secondary | ICD-10-CM

## 2024-01-15 DIAGNOSIS — I480 Paroxysmal atrial fibrillation: Secondary | ICD-10-CM | POA: Diagnosis not present

## 2024-01-15 DIAGNOSIS — I10 Essential (primary) hypertension: Secondary | ICD-10-CM | POA: Diagnosis not present

## 2024-01-15 DIAGNOSIS — F419 Anxiety disorder, unspecified: Secondary | ICD-10-CM | POA: Diagnosis not present

## 2024-01-15 DIAGNOSIS — Z853 Personal history of malignant neoplasm of breast: Secondary | ICD-10-CM | POA: Diagnosis not present

## 2024-01-15 NOTE — Progress Notes (Signed)
 Subjective:    Patient ID: Deborah Lowe, female    DOB: Oct 27, 1940, 83 y.o.   MRN: [id number]  Chief Complaint  Patient presents with   Medical Management of Chronic Issues   PT presents to the office for chronic follow up.    She was seen at Cardiologists every annually for A FIb, CAD, and HTN. Taking Eliquis  5 mg BID. Stable.   Her BP is elevated today, but brings in home log and her BP has been 128/55.    She is followed by GYN annually.    Has hx of left breast cancer and gets annual mammograms.  Complaining of urge incontinence at times.  Gastroesophageal Reflux She complains of belching, heartburn and a hoarse voice. This is a chronic problem. The current episode started more than 1 year ago. The problem occurs occasionally. The problem has been resolved. The symptoms are aggravated by certain foods. She has tried a PPI for the symptoms. The treatment provided moderate relief.  Hypertension This is a chronic problem. The current episode started more than 1 year ago. The problem has been waxing and waning since onset. The problem is uncontrolled. Associated symptoms include anxiety and malaise/fatigue. Pertinent negatives include no peripheral edema or shortness of breath. Risk factors for coronary artery disease include dyslipidemia and sedentary lifestyle. The current treatment provides moderate improvement.  Anxiety Presents for follow-up visit. Symptoms include depressed mood, excessive worry and nervous/anxious behavior. Patient reports no shortness of breath. Symptoms occur occasionally. The severity of symptoms is mild.        Review of Systems  Constitutional:  Positive for malaise/fatigue.  HENT:  Positive for hoarse voice.   Respiratory:  Negative for shortness of breath.   Gastrointestinal:  Positive for heartburn.  Psychiatric/Behavioral:  The patient is nervous/anxious.   All other systems reviewed and are negative.  Family History  Problem Relation Age  of Onset   Colon cancer Mother    Heart disease Father    Bone cancer Brother    Social History   Socioeconomic History   Marital status: Married    Spouse name: Jerel   Number of children: 1   Years of education: 12   Highest education level: Not on file  Occupational History   Occupation: retired  Tobacco Use   Smoking status: Never   Smokeless tobacco: Never  Vaping Use   Vaping status: Never Used  Substance and Sexual Activity   Alcohol use: No   Drug use: No   Sexual activity: Yes    Birth control/protection: Post-menopausal  Other Topics Concern   Not on file  Social History Narrative   One daughter   Social Drivers of Health   Financial Resource Strain: Low Risk  (09/28/2023)   Overall Financial Resource Strain (CARDIA)    Difficulty of Paying Living Expenses: Not hard at all  Food Insecurity: No Food Insecurity (09/28/2023)   Hunger Vital Sign    Worried About Running Out of Food in the Last Year: Never true    Ran Out of Food in the Last Year: Never true  Transportation Needs: No Transportation Needs (09/28/2023)   PRAPARE - Administrator, Civil Service (Medical): No    Lack of Transportation (Non-Medical): No  Physical Activity: Insufficiently Active (09/28/2023)   Exercise Vital Sign    Days of Exercise per Week: 4 days    Minutes of Exercise per Session: 30 min  Stress: No Stress Concern Present (09/28/2023)   [place]  [employer] - Occupational Stress Questionnaire    Feeling of Stress : Not at all  Social Connections: Moderately Integrated (09/28/2023)   Social Connection and Isolation Panel    Frequency of Communication with Friends and Family: More than three times a week    Frequency of Social Gatherings with Friends and Family: More than three times a week    Attends Religious Services: More than 4 times per year    Active Member of [employer] or Organizations: No    Attends Banker Meetings: Never    Marital  Status: Married       Objective:   Physical Exam Vitals reviewed.  Constitutional:      General: She is not in acute distress.    Appearance: She is well-developed.  HENT:     Head: Normocephalic and atraumatic.     Right Ear: Tympanic membrane normal.     Left Ear: Tympanic membrane normal.  Eyes:     Pupils: Pupils are equal, round, and reactive to light.  Neck:     Thyroid : No thyromegaly.  Cardiovascular:     Rate and Rhythm: Normal rate and regular rhythm.     Heart sounds: Normal heart sounds. No murmur heard. Pulmonary:     Effort: Pulmonary effort is normal. No respiratory distress.     Breath sounds: Normal breath sounds. No wheezing.  Abdominal:     General: Bowel sounds are normal. There is no distension.     Palpations: Abdomen is soft.     Tenderness: There is no abdominal tenderness.  Musculoskeletal:        General: No tenderness. Normal range of motion.     Cervical back: Normal range of motion and neck supple.  Skin:    General: Skin is warm and dry.  Neurological:     Mental Status: She is alert and oriented to person, place, and time.     Cranial Nerves: No cranial nerve deficit.     Deep Tendon Reflexes: Reflexes are normal and symmetric.  Psychiatric:        Behavior: Behavior normal.        Thought Content: Thought content normal.        Judgment: Judgment normal.        BP (!) 171/63   Pulse 78   Temp (!) 97.3 F (36.3 C) (Temporal)   Ht 5' 1 (1.549 m)   Wt 117 lb (53.1 kg)   BMI 22.11 kg/m   Assessment & Plan:   Deborah Lowe comes in today with chief complaint of Medical Management of Chronic Issues   Diagnosis and orders addressed:  1. Essential hypertension (Primary) - CMP14+EGFR  2. History of left breast cancer - CMP14+EGFR  3. Pure hypercholesterolemia - CMP14+EGFR  4. Anxiety - CMP14+EGFR  5. Paroxysmal atrial fibrillation (HCC) - CMP14+EGFR  6. Coronary artery disease involving native coronary artery of  native heart without angina pectoris - CMP14+EGFR    Labs pending Continue current medications  Health Maintenance reviewed Diet and exercise encouraged  Follow up plan: 6 months   Bari Learn, FNP

## 2024-01-15 NOTE — Patient Instructions (Signed)
 Health Maintenance After Age 81 After age 27, you are at a higher risk for certain long-term diseases and infections as well as injuries from falls. Falls are a major cause of broken bones and head injuries in people who are older than age 73. Getting regular preventive care can help to keep you healthy and well. Preventive care includes getting regular testing and making lifestyle changes as recommended by your health care provider. Talk with your health care provider about: Which screenings and tests you should have. A screening is a test that checks for a disease when you have no symptoms. A diet and exercise plan that is right for you. What should I know about screenings and tests to prevent falls? Screening and testing are the best ways to find a health problem early. Early diagnosis and treatment give you the best chance of managing medical conditions that are common after age 90. Certain conditions and lifestyle choices may make you more likely to have a fall. Your health care provider may recommend: Regular vision checks. Poor vision and conditions such as cataracts can make you more likely to have a fall. If you wear glasses, make sure to get your prescription updated if your vision changes. Medicine review. Work with your health care provider to regularly review all of the medicines you are taking, including over-the-counter medicines. Ask your health care provider about any side effects that may make you more likely to have a fall. Tell your health care provider if any medicines that you take make you feel dizzy or sleepy. Strength and balance checks. Your health care provider may recommend certain tests to check your strength and balance while standing, walking, or changing positions. Foot health exam. Foot pain and numbness, as well as not wearing proper footwear, can make you more likely to have a fall. Screenings, including: Osteoporosis screening. Osteoporosis is a condition that causes  the bones to get weaker and break more easily. Blood pressure screening. Blood pressure changes and medicines to control blood pressure can make you feel dizzy. Depression screening. You may be more likely to have a fall if you have a fear of falling, feel depressed, or feel unable to do activities that you used to do. Alcohol  use screening. Using too much alcohol  can affect your balance and may make you more likely to have a fall. Follow these instructions at home: Lifestyle Do not drink alcohol  if: Your health care provider tells you not to drink. If you drink alcohol : Limit how much you have to: 0-1 drink a day for women. 0-2 drinks a day for men. Know how much alcohol  is in your drink. In the [place] one drink equals one 12 oz bottle of beer (355 mL), one 5 oz glass of wine (148 mL), or one 1 oz glass of hard liquor (44 mL). Do not use any products that contain nicotine or tobacco. These products include cigarettes, chewing tobacco, and vaping devices, such as e-cigarettes. If you need help quitting, ask your health care provider. Activity  Follow a regular exercise program to stay fit. This will help you maintain your balance. Ask your health care provider what types of exercise are appropriate for you. If you need a cane or walker, use it as recommended by your health care provider. Wear supportive shoes that have nonskid soles. Safety  Remove any tripping hazards, such as rugs, cords, and clutter. Install safety equipment such as grab bars in bathrooms and safety rails on stairs. Keep rooms and walkways  well-lit. General instructions Talk with your health care provider about your risks for falling. Tell your health care provider if: You fall. Be sure to tell your health care provider about all falls, even ones that seem minor. You feel dizzy, tiredness (fatigue), or off-balance. Take over-the-counter and prescription medicines only as told by your health care provider. These include  supplements. Eat a healthy diet and maintain a healthy weight. A healthy diet includes low-fat dairy products, low-fat (lean) meats, and fiber from whole grains, beans, and lots of fruits and vegetables. Stay current with your vaccines. Schedule regular health, dental, and eye exams. Summary Having a healthy lifestyle and getting preventive care can help to protect your health and wellness after age 15. Screening and testing are the best way to find a health problem early and help you avoid having a fall. Early diagnosis and treatment give you the best chance for managing medical conditions that are more common for people who are older than age 42. Falls are a major cause of broken bones and head injuries in people who are older than age 64. Take precautions to prevent a fall at home. Work with your health care provider to learn what changes you can make to improve your health and wellness and to prevent falls. This information is not intended to replace advice given to you by your health care provider. Make sure you discuss any questions you have with your health care provider. Document Revised: 10/01/2020 Document Reviewed: 10/01/2020 Elsevier Patient Education  2024 [employer].

## 2024-01-16 LAB — CMP14+EGFR
ALT: 12 IU/L (ref 0–32)
AST: 15 IU/L (ref 0–40)
Albumin: 4.3 g/dL (ref 3.8–4.8)
Alkaline Phosphatase: 93 IU/L (ref 44–121)
BUN/Creatinine Ratio: 17 (ref 12–28)
BUN: 13 mg/dL (ref 8–27)
Bilirubin Total: 0.9 mg/dL (ref 0.0–1.2)
CO2: 24 mmol/L (ref 20–29)
Calcium: 9.5 mg/dL (ref 8.7–10.3)
Chloride: 100 mmol/L (ref 96–106)
Creatinine, Ser: 0.75 mg/dL (ref 0.57–1.00)
Globulin, Total: 2.2 g/dL (ref 1.5–4.5)
Glucose: 97 mg/dL (ref 70–99)
Potassium: 4.5 mmol/L (ref 3.5–5.2)
Sodium: 138 mmol/L (ref 134–144)
Total Protein: 6.5 g/dL (ref 6.0–8.5)
eGFR: 80 mL/min/1.73 (ref >59)

## 2024-01-18 ENCOUNTER — Ambulatory Visit: Payer: Self-pay | Admitting: Family

## 2024-02-08 ENCOUNTER — Ambulatory Visit: Payer: Self-pay | Admitting: *Deleted

## 2024-02-08 ENCOUNTER — Ambulatory Visit: Payer: Self-pay | Admitting: Family Medicine

## 2024-02-08 ENCOUNTER — Ambulatory Visit (INDEPENDENT_AMBULATORY_CARE_PROVIDER_SITE_OTHER): Admitting: Family Medicine

## 2024-02-08 ENCOUNTER — Encounter: Payer: Self-pay | Admitting: Family Medicine

## 2024-02-08 DIAGNOSIS — N3090 Cystitis, unspecified without hematuria: Secondary | ICD-10-CM | POA: Diagnosis not present

## 2024-02-08 DIAGNOSIS — R300 Dysuria: Secondary | ICD-10-CM | POA: Diagnosis not present

## 2024-02-08 LAB — URINALYSIS, ROUTINE W REFLEX MICROSCOPIC
Bilirubin, UA: NEGATIVE
Ketones, UA: NEGATIVE
Nitrite, UA: POSITIVE — AB
Protein,UA: NEGATIVE
Specific Gravity, UA: >=1.03 — AB (ref 1.005–1.030)
Urobilinogen, Ur: 1 mg/dL (ref 0.2–1.0)
pH, UA: 5 (ref 5.0–7.5)

## 2024-02-08 LAB — MICROSCOPIC EXAMINATION
RBC, Urine: NONE SEEN /HPF (ref 0–2)
Renal Epithel, UA: NONE SEEN /HPF
Yeast, UA: NONE SEEN

## 2024-02-08 MED ORDER — SULFAMETHOXAZOLE-TRIMETHOPRIM 800-160 MG PO TABS: 1.0000 | ORAL_TABLET | Freq: Two times a day (BID) | ORAL | 0 refills | Status: DC

## 2024-02-08 NOTE — Progress Notes (Signed)
 Subjective:  Patient ID: Deborah Lowe, female    DOB: Oct 10, 1940  Age: 83 y.o. MRN: [id number]  CC: Dysuria   HPI  Discussed the use of AI scribe software for clinical note transcription with the patient, who gave verbal consent to proceed.  History of Present Illness Deborah Lowe is an 83 year old female who presents with bladder symptoms.  She has been experiencing dysuria for approximately four days, specifically during urination. She has been using AZO for relief, noting that symptoms worsen if she does not take it as directed, sometimes requiring an additional dose at night. Symptoms improve after taking AZO.  She reports increased urinary frequency and urgency since the onset of symptoms, describing the urgency as a strong need to urinate immediately to avoid accidents.  No fever, chills, nausea, vomiting, or flank pain.  She has a history of urinary infections occurring two to three times a year. She is allergic to Avelox and prefers to avoid Cipro and levofloxacin due to their similarity.          02/08/2024   10:19 AM 01/15/2024   11:40 AM 09/28/2023    2:51 PM  Depression screen PHQ 2/9  Decreased Interest 1 0 0  Down, Depressed, Hopeless 0 0 0  PHQ - 2 Score 1 0 0  Altered sleeping 0 0 0  Tired, decreased energy 1 1 1   Change in appetite 0 0 0  Feeling bad or failure about yourself  1 0 0  Trouble concentrating 0 0 1  Moving slowly or fidgety/restless 0 0 0  Suicidal thoughts 0 0 0  PHQ-9 Score 3 1 2   Difficult doing work/chores  Not difficult at all Not difficult at all    History Deborah Lowe has a past medical history of Breast cancer (HCC) (05/26/2010), Hypertension, Palpitations, Paroxysmal atrial fibrillation (HCC) (08/12/2022), Ringing in ears, and Wears glasses.   She has a past surgical history that includes Cholecystectomy; Tear duct probing; Cardiac catheterization (01/29/2007); Cardiovascular stress test (07/19/2002); Breast surgery; and Breast  lumpectomy (Left).   Her family history includes Bone cancer in her brother; Colon cancer in her mother; Heart disease in her father.She reports that she has never smoked. She has never used smokeless tobacco. She reports that she does not drink alcohol and does not use drugs.    ROS Review of Systems  Constitutional:  Negative for chills, diaphoresis and fever.  HENT:  Negative for congestion.   Eyes:  Negative for visual disturbance.  Respiratory:  Negative for cough and shortness of breath.   Cardiovascular:  Negative for chest pain and palpitations.  Gastrointestinal:  Negative for constipation, diarrhea and nausea.  Genitourinary:  Positive for dysuria, frequency and urgency. Negative for decreased urine volume, flank pain, hematuria, menstrual problem and pelvic pain.  Musculoskeletal:  Negative for arthralgias and joint swelling.  Skin:  Negative for rash.  Neurological:  Negative for dizziness and numbness.    Objective:  BP (!) 148/56   Pulse (!) 59   Temp 97.8 F (36.6 C)   Ht 5' 1 (1.549 m)   Wt 119 lb (54 kg)   SpO2 95%   BMI 22.48 kg/m   BP Readings from Last 3 Encounters:  02/08/24 (!) 148/56  01/15/24 (!) 128/55  01/06/24 (!) 161/60    Wt Readings from Last 3 Encounters:  02/08/24 119 lb (54 kg)  01/15/24 117 lb (53.1 kg)  01/06/24 118 lb 9.6 oz (53.8 kg)     Physical  Exam Constitutional:      Appearance: She is well-developed.  HENT:     Head: Normocephalic and atraumatic.  Cardiovascular:     Rate and Rhythm: Normal rate and regular rhythm.     Heart sounds: No murmur heard. Pulmonary:     Effort: Pulmonary effort is normal.     Breath sounds: Normal breath sounds.  Abdominal:     General: Bowel sounds are normal.     Palpations: Abdomen is soft. There is no mass.     Tenderness: There is no abdominal tenderness. There is no guarding or rebound.  Musculoskeletal:        General: No tenderness.  Skin:    General: Skin is warm and dry.   Neurological:     Mental Status: She is alert and oriented to person, place, and time.  Psychiatric:        Behavior: Behavior normal.      Assessment & Plan:  Cystitis -     Urine Culture -     Urinalysis, Routine w reflex microscopic  Other orders -     Sulfamethoxazole -Trimethoprim ; Take 1 tablet by mouth 2 (two) times daily.  Dispense: 14 tablet; Refill: 0    Assessment and Plan Assessment & Plan Acute cystitis without hematuria   She presents with acute cystitis, experiencing dysuria, increased frequency, and urgency since Thursday. Urinalysis confirms infection with bacteria, white blood cells, and positive nitrite. She has no fever, chills, nausea, vomiting, flank pain, or abdominal tenderness, indicating no kidney involvement. Prescribe Bactrim  (sulfamethoxazole /trimethoprim ) twice daily for 7 days. Continue Azo for symptom relief as needed and discontinue when symptoms resolve. Advise monitoring symptoms and returning if she persists or if there are any concerns.       Follow-up: Return if symptoms worsen or fail to improve.  Butler Der, M.D.

## 2024-02-08 NOTE — Telephone Encounter (Signed)
 Appt today

## 2024-02-08 NOTE — Telephone Encounter (Signed)
 Copied from CRM [id number]. Topic: Clinical - Red Word Triage >> Feb 08, 2024  7:57 AM Willma R wrote: Red Word that prompted transfer to Nurse Triage: Patient thinks she has a UTI, has had burning when she urinates since last Thursday. Reason for Disposition  All other urine symptoms  Answer Assessment - Initial Assessment Questions 1. SYMPTOM: What's the main symptom you're concerned about? (e.g., frequency, incontinence)     I'm burning since last Thursday when I urinate. 2. ONSET: When did the  burning  start?     Last Thur. 3. PAIN: Is there any pain? If Yes, ask: How bad is it? (Scale: 1-10; mild, moderate, severe)     I'm using AZO   4. CAUSE: What do you think is causing the symptoms?     UTI 5. OTHER SYMPTOMS: Do you have any other symptoms? (e.g., blood in urine, fever, flank pain, pain with urination)     Lately   6. PREGNANCY: Is there any chance you are pregnant? When was your last menstrual period?     N/A  Protocols used: Urinary Symptoms-A-AH FYI Only or Action Required?: FYI only for provider.  Patient was last seen in primary care on 01/15/2024 by Lavell Bari LABOR, FNP.  Called Nurse Triage reporting Dysuria.  Symptoms began several days ago.  Interventions attempted: OTC medications: AZO.  Symptoms are: gradually worsening.  Triage Disposition: See PCP Within 2 Weeks  Patient/caregiver understands and will follow disposition?: Yes

## 2024-02-11 LAB — URINE CULTURE

## 2024-02-15 DIAGNOSIS — R300 Dysuria: Secondary | ICD-10-CM | POA: Diagnosis not present

## 2024-02-15 DIAGNOSIS — K639 Disease of intestine, unspecified: Secondary | ICD-10-CM | POA: Diagnosis not present

## 2024-02-15 DIAGNOSIS — N390 Urinary tract infection, site not specified: Secondary | ICD-10-CM | POA: Diagnosis not present

## 2024-02-17 ENCOUNTER — Telehealth: Payer: Self-pay | Admitting: Cardiovascular Disease

## 2024-02-17 DIAGNOSIS — K639 Disease of intestine, unspecified: Secondary | ICD-10-CM | POA: Diagnosis not present

## 2024-02-17 MED ORDER — VALSARTAN 80 MG PO TABS: 80.0000 mg | ORAL_TABLET | Freq: Every day | ORAL | 3 refills | Status: AC

## 2024-02-17 NOTE — Telephone Encounter (Signed)
 Spoke to patient she is concerned B/P has been low ranging 101/49,104/44,104/50.Pulse 60's.She feels tired.Medications reviewed and med list is correct.Appointment scheduled with Dr.O'Neal to establish 10/15 at 9:20 am.Advised to monitor B/P daily,bring readings to appointment and all medications.Message sent to Glendia Ferrier PA for review.

## 2024-02-17 NOTE — Telephone Encounter (Signed)
 Spoke to patient Deborah Ferrier PA advised to decrease Valsartan  to 80 mg daily.Advised to continue to monitor B/P and bring readings to appointment.

## 2024-02-17 NOTE — Telephone Encounter (Signed)
 Pt c/o BP issue: STAT if pt c/o blurred vision, one-sided weakness or slurred speech.  STAT if BP is GREATER than 180/120 TODAY.  STAT if BP is LESS than 90/60 and SYMPTOMATIC TODAY  1. What is your BP concern? She stated her BP has been low   2. Have you taken any BP medication today? Yes   3. What are your last 5 BP readings? Tues -101/49  65   Wed -104/44  61  4. Are you having any other symptoms (ex. Dizziness, headache, blurred vision, passed out)? Just tired / it just stated the last few days   Best number [phone]

## 2024-02-17 NOTE — Telephone Encounter (Signed)
 Decrease Valsartan  to 80 mg once daily and continue to monitor BP. Glendia Ferrier, PA-C    02/17/2024 5:02 PM

## 2024-02-20 ENCOUNTER — Telehealth: Payer: Self-pay | Admitting: Emergency Medicine

## 2024-02-20 NOTE — Telephone Encounter (Signed)
 Patient concerned about her blood pressure being low (104/49)  Patient has been checking blood pressure 5 to 1 minute apart and was concerned about her readings.  Nurse talked with EMERSON Silk, NP and instructed that patient only needs to check BP twice a day and if blood pressure below 90/60 or symptoms like dizziness, headache,weakness, chest pain, SOB, etc occur please go to ED.  Patient instructed to use the same cuff when checking BP.  Patient verbalized understanding.

## 2024-03-02 ENCOUNTER — Telehealth: Payer: Self-pay | Admitting: Physician Assistant

## 2024-03-02 NOTE — Telephone Encounter (Signed)
 Pt c/o BP issue: STAT if pt c/o blurred vision, one-sided weakness or slurred speech.  STAT if BP is GREATER than 180/120 TODAY.  STAT if BP is LESS than 90/60 and SYMPTOMATIC TODAY  1. What is your BP concern? hypotension  2. Have you taken any BP medication today?yes   3. What are your last 5 BP readings?107/48,108/46,125/48, 121/57  4. Are you having any other symptoms (ex. Dizziness, headache, blurred vision, passed out)? no

## 2024-03-02 NOTE — Telephone Encounter (Signed)
 Called pt in regards to BP.  Reports the following BP 10/5-125/48, 10/6-121/57, 10/7-127/53 and 10/8-108/46.  Pt called in on 02/17/24 with low BP readings.  Valsartan  was decreased to 80 mg PO every day.  Pt denies palpitations, drinks about 2 12oz bottles of water, about 8 oz tea and a cup of coffee daily.  Advised pt may try drinking a little more fluids daily.  Denies feeling lightheaded.  Advised pt will send to provider to advise.  Has OV with MD on 03/09/24.

## 2024-03-02 NOTE — Telephone Encounter (Signed)
 Called pt advised of Providers response:  Those blood pressures are optimal. As long as she is not feeling bad with these, I would not change anything at this time. Glendia Ferrier, PA-C    03/02/2024 4:54 PM       Pt expresses understanding no further concerns at this time.

## 2024-03-02 NOTE — Telephone Encounter (Signed)
 Those blood pressures are optimal. As long as she is not feeling bad with these, I would not change anything at this time. Glendia Ferrier, PA-C    03/02/2024 4:54 PM

## 2024-03-07 NOTE — Progress Notes (Unsigned)
 Cardiology Office Note:  .   Date:  03/09/2024  ID:  Deborah Lowe, DOB 14-Jan-1941, MRN [id number] PCP: Lavell Bari DELENA, FNP  [facility] Providers Cardiologist:  Darryle ONEIDA Decent, MD Cardiology APP:  Lelon Glendia ONEIDA, PA-C { History of Present Illness: .    Chief Complaint  Patient presents with   Follow-up    Nikie A Mcmillon is a 83 y.o. female with history of pAF, CAD, HTN who presents for follow-up.    History of Present Illness   Wiley A Gilcrest is an 83 year old female with paroxysmal AFib and hypertension who presents for follow-up. She was previously under the care of Dr. Robby, who has retired.  She has a history of paroxysmal atrial fibrillation diagnosed approximately a year and a half ago, managed with Eliquis  and metoprolol . There has been no recurrence of AFib, but she experiences occasional palpitations. She also reports low energy, which she attributes to her age. She reports low energy and sometimes attributes it to her age.  Her hypertension was previously managed with valsartan  160 mg daily, but due to low blood pressure readings, the dose was reduced to 80 mg daily. Her blood pressure values have been stable, ranging between 120 to 140 mmHg. She occasionally feels tired and sometimes experiences low blood pressure readings, but denies dizziness or lightheadedness. She was taking metoprolol  twice a day prior to this visit. HR 40-50 bpm at home.   Her past medical history includes minimal coronary artery disease and hyperlipidemia. She is not on any statin therapy. Her most recent LDL was 114 mg/dL, and her coronary calcium score was 28, placing her in the 31st percentile.  In terms of her social history, she is active and recently walked five miles during a trip to [place] . She exercises for about 30 minutes at home when the weather is bad. She has been married for 62 years, has one daughter, and two granddaughters. There is no significant family history of  heart disease, but cancer is present in the family.          Problem List Paroxysmal Afib Minimal CAD -CAC 28 (31st percentile)  3. HLD -T chol 190, HDL 63, LDL 114, TG 73 4. HTN    ROS: All other ROS reviewed and negative. Pertinent positives noted in the HPI.     Studies Reviewed: .       BP 10/20/2023 24 hour BP readings  Systolic BP ranges 115 - 191   Mean 137 Diastolic BP readings 40 - 83 with mean of 55  TTE 09/10/2022  1. Left ventricular ejection fraction, by estimation, is 60 to 65%. The  left ventricle has normal function. The left ventricle has no regional  wall motion abnormalities. Left ventricular diastolic parameters were  normal.   2. Right ventricular systolic function is normal. The right ventricular  size is normal. There is normal pulmonary artery systolic pressure. The  estimated right ventricular systolic pressure is 30.9 mmHg.   3. Left atrial size was mildly dilated.   4. The mitral valve is normal in structure. Trivial mitral valve  regurgitation. No evidence of mitral stenosis.   5. The aortic valve is tricuspid. Aortic valve regurgitation is trivial.  No aortic stenosis is present.   6. The inferior vena cava is normal in size with greater than 50%  respiratory variability, suggesting right atrial pressure of 3 mmHg.  Physical Exam:   VS:  BP 120/70   Pulse (!) 54  Ht 5' 1 (1.549 m)   Wt 119 lb (54 kg)   BMI 22.48 kg/m    Wt Readings from Last 3 Encounters:  03/09/24 119 lb (54 kg)  02/08/24 119 lb (54 kg)  01/15/24 117 lb (53.1 kg)    GEN: Well nourished, well developed in no acute distress NECK: No JVD; No carotid bruits CARDIAC: RRR, no murmurs, rubs, gallops RESPIRATORY:  Clear to auscultation without rales, wheezing or rhonchi  ABDOMEN: Soft, non-tender, non-distended EXTREMITIES:  No edema; No deformity  ASSESSMENT AND PLAN: .   Assessment and Plan    Paroxysmal atrial fibrillation with anticoagulation Paroxysmal atrial  fibrillation managed with Eliquis  and metoprolol . No recurrence of AFib, occasional palpitations reported. Current Eliquis  dose appropriate for weight and age. - Continue Eliquis  2.5 mg BID. - Reduce metoprolol  succinate to 25 mg nightly.  Sinus bradycardia secondary to beta-blocker therapy Sinus bradycardia with heart rate of 51 bpm, likely due to metoprolol . Bradycardia may contribute to low energy levels. - Reduce metoprolol  succinate to 25 mg nightly. - Monitor heart rate and energy levels. - Contact provider if symptoms persist or worsen.  Essential hypertension Hypertension managed with valsartan  80 mg. Blood pressure well-controlled, ranging from 120 to 140. - Continue valsartan  80 mg daily. - Monitor blood pressure regularly. - Contact provider if blood pressure increases.  Minimal CAD Minimal coronary artery disease with coronary calcium score of 28 (31st percentile). LDL is 114, acceptable for age. No angina reported. - No statin therapy initiated. - OK to forgo statin given minimal CAD and age 69              Follow-up: Return in about 1 year (around 03/09/2025).   Signed, Darryle DASEN. Barbaraann, MD, [facility]  [facility]  [address] [place], [place] [address] [phone]  9:34 AM

## 2024-03-09 ENCOUNTER — Encounter: Payer: Self-pay | Admitting: Cardiovascular Disease

## 2024-03-09 ENCOUNTER — Ambulatory Visit: Attending: Cardiovascular Disease | Admitting: Cardiovascular Disease

## 2024-03-09 VITALS — BP 120/70 | HR 54 | Ht 61.0 in | Wt 119.0 lb

## 2024-03-09 DIAGNOSIS — I2510 Atherosclerotic heart disease of native coronary artery without angina pectoris: Secondary | ICD-10-CM | POA: Diagnosis not present

## 2024-03-09 DIAGNOSIS — I10 Essential (primary) hypertension: Secondary | ICD-10-CM | POA: Insufficient documentation

## 2024-03-09 DIAGNOSIS — I480 Paroxysmal atrial fibrillation: Secondary | ICD-10-CM | POA: Insufficient documentation

## 2024-03-09 DIAGNOSIS — R001 Bradycardia, unspecified: Secondary | ICD-10-CM | POA: Insufficient documentation

## 2024-03-09 DIAGNOSIS — E782 Mixed hyperlipidemia: Secondary | ICD-10-CM | POA: Insufficient documentation

## 2024-03-09 MED ORDER — METOPROLOL SUCCINATE ER 25 MG PO TB24: 25.0000 mg | ORAL_TABLET | Freq: Every day | ORAL | 3 refills | Status: AC

## 2024-03-09 NOTE — Patient Instructions (Signed)
 Medication Instructions:  Your physician has recommended you make the following change in your medication:  DECREASE: metoprolol  succinate to 25 mg by mouth once daily at bedtime  *If you need a refill on your cardiac medications before your next appointment, please call your pharmacy*  Lab Work: NONE  If you have labs (blood work) drawn today and your tests are completely normal, you will receive your results only by: MyChart Message (if you have MyChart) OR A paper copy in the mail If you have any lab test that is abnormal or we need to change your treatment, we will call you to review the results.  Testing/Procedures: NONE  Follow-Up: At [facility], you and your health needs are our priority.  As part of our continuing mission to provide you with exceptional heart care, our providers are all part of one team.  This team includes your primary Cardiologist (physician) and Advanced Practice Providers or APPs (Physician Assistants and Nurse Practitioners) who all work together to provide you with the care you need, when you need it.  Your next appointment:   1 year(s)  Provider:   Glendia Ferrier, PA-C

## 2024-03-31 ENCOUNTER — Other Ambulatory Visit: Payer: Self-pay | Admitting: Family

## 2024-03-31 DIAGNOSIS — F419 Anxiety disorder, unspecified: Secondary | ICD-10-CM

## 2024-04-01 ENCOUNTER — Encounter: Payer: Self-pay | Admitting: Family

## 2024-04-01 ENCOUNTER — Ambulatory Visit: Admitting: Family

## 2024-04-01 VITALS — BP 150/73 | HR 62 | Temp 97.1°F | Ht 61.0 in | Wt 118.4 lb

## 2024-04-01 DIAGNOSIS — I2510 Atherosclerotic heart disease of native coronary artery without angina pectoris: Secondary | ICD-10-CM | POA: Diagnosis not present

## 2024-04-01 DIAGNOSIS — I10 Essential (primary) hypertension: Secondary | ICD-10-CM | POA: Diagnosis not present

## 2024-04-01 DIAGNOSIS — I480 Paroxysmal atrial fibrillation: Secondary | ICD-10-CM

## 2024-04-01 DIAGNOSIS — F419 Anxiety disorder, unspecified: Secondary | ICD-10-CM | POA: Diagnosis not present

## 2024-04-01 DIAGNOSIS — M810 Age-related osteoporosis without current pathological fracture: Secondary | ICD-10-CM | POA: Diagnosis not present

## 2024-04-01 DIAGNOSIS — E7800 Pure hypercholesterolemia, unspecified: Secondary | ICD-10-CM

## 2024-04-01 MED ORDER — ESCITALOPRAM OXALATE 5 MG PO TABS: 5.0000 mg | ORAL_TABLET | Freq: Every day | ORAL | 3 refills | Status: AC

## 2024-04-01 NOTE — Progress Notes (Signed)
 Subjective:    Patient ID: Deborah Lowe, female    DOB: 27-Aug-1940, 83 y.o.   MRN: [id number]  Chief Complaint  Patient presents with   Medical Management of Chronic Issues   PT presents to the office for chronic follow up.    She was seen at Cardiologists every annually for A FIb, CAD, and HTN. Taking Eliquis  5 mg BID. Stable.   Her BP is elevated today, but brings in home log and her BP has been good.    She is followed by GYN annually.    Has hx of left breast cancer and gets annual mammograms.  Has osteoporosis, but not taking  fosamax  weekly. Last dexa scan was 08/25/23. Gastroesophageal Reflux She complains of belching, heartburn and a hoarse voice. This is a chronic problem. The current episode started more than 1 year ago. The problem occurs occasionally. The problem has been resolved. The symptoms are aggravated by certain foods. She has tried a PPI for the symptoms. The treatment provided moderate relief.  Hypertension This is a chronic problem. The current episode started more than 1 year ago. The problem has been waxing and waning since onset. The problem is uncontrolled. Associated symptoms include anxiety and malaise/fatigue. Pertinent negatives include no peripheral edema or shortness of breath. Risk factors for coronary artery disease include dyslipidemia and sedentary lifestyle. The current treatment provides moderate improvement.  Anxiety Presents for follow-up visit. Symptoms include depressed mood, excessive worry and nervous/anxious behavior. Patient reports no shortness of breath. Symptoms occur occasionally. The severity of symptoms is mild.        Review of Systems  Constitutional:  Positive for malaise/fatigue.  HENT:  Positive for hoarse voice.   Respiratory:  Negative for shortness of breath.   Gastrointestinal:  Positive for heartburn.  Psychiatric/Behavioral:  The patient is nervous/anxious.   All other systems reviewed and are negative.  Family  History  Problem Relation Age of Onset   Colon cancer Mother    Heart disease Father    Bone cancer Brother    Social History   Socioeconomic History   Marital status: Married    Spouse name: Deborah Lowe   Number of children: 1   Years of education: 12   Highest education level: Not on file  Occupational History   Occupation: retired   Occupation: Retired Airline Pilot  Tobacco Use   Smoking status: Never   Smokeless tobacco: Never  Vaping Use   Vaping status: Never Used  Substance and Sexual Activity   Alcohol use: No   Drug use: No   Sexual activity: Yes    Birth control/protection: Post-menopausal  Other Topics Concern   Not on file  Social History Narrative   One daughter   Social Drivers of Corporate Investment Banker Strain: Low Risk  (09/28/2023)   Overall Financial Resource Strain (CARDIA)    Difficulty of Paying Living Expenses: Not hard at all  Food Insecurity: No Food Insecurity (09/28/2023)   Hunger Vital Sign    Worried About Running Out of Food in the Last Year: Never true    Ran Out of Food in the Last Year: Never true  Transportation Needs: No Transportation Needs (09/28/2023)   PRAPARE - Administrator, Civil Service (Medical): No    Lack of Transportation (Non-Medical): No  Physical Activity: Insufficiently Active (09/28/2023)   Exercise Vital Sign    Days of Exercise per Week: 4 days    Minutes of Exercise per Session: 30  min  Stress: No Stress Concern Present (09/28/2023)   [name] of Occupational Health - Occupational Stress Questionnaire    Feeling of Stress : Not at all  Social Connections: Moderately Integrated (09/28/2023)   Social Connection and Isolation Panel    Frequency of Communication with Friends and Family: More than three times a week    Frequency of Social Gatherings with Friends and Family: More than three times a week    Attends Religious Services: More than 4 times per year    Active Member of [employer] or Organizations: No     Attends Banker Meetings: Never    Marital Status: Married       Objective:   Physical Exam Vitals reviewed.  Constitutional:      General: She is not in acute distress.    Appearance: She is well-developed.  HENT:     Head: Normocephalic and atraumatic.     Right Ear: Tympanic membrane normal.     Left Ear: Tympanic membrane normal.  Eyes:     Pupils: Pupils are equal, round, and reactive to light.  Neck:     Thyroid : No thyromegaly.  Cardiovascular:     Rate and Rhythm: Normal rate and regular rhythm.     Heart sounds: Normal heart sounds. No murmur heard. Pulmonary:     Effort: Pulmonary effort is normal. No respiratory distress.     Breath sounds: Normal breath sounds. No wheezing.  Abdominal:     General: Bowel sounds are normal. There is no distension.     Palpations: Abdomen is soft.     Tenderness: There is no abdominal tenderness.  Musculoskeletal:        General: No tenderness. Normal range of motion.     Cervical back: Normal range of motion and neck supple.  Skin:    General: Skin is warm and dry.  Neurological:     Mental Status: She is alert and oriented to person, place, and time.     Cranial Nerves: No cranial nerve deficit.     Deep Tendon Reflexes: Reflexes are normal and symmetric.  Psychiatric:        Behavior: Behavior normal.        Thought Content: Thought content normal.        Judgment: Judgment normal.        BP (!) 150/73   Pulse 62   Temp (!) 97.1 F (36.2 C) (Temporal)   Ht 5' 1 (1.549 m)   Wt 118 lb 6.4 oz (53.7 kg)   SpO2 98%   BMI 22.37 kg/m   Assessment & Plan:   Deborah Lowe comes in today with chief complaint of Medical Management of Chronic Issues   Diagnosis and orders addressed:  1. Pure hypercholesterolemia (Primary) - CMP14+EGFR - Lipid panel  2. Paroxysmal atrial fibrillation (HCC) - CMP14+EGFR  3. Essential hypertension  - CMP14+EGFR  4. Coronary artery disease involving native coronary  artery of native heart without angina pectoris - CMP14+EGFR  5. Anxiety  - CMP14+EGFR  6. Age-related osteoporosis without current pathological fracture - CMP14+EGFR   Labs pending Continue current medications  Health Maintenance reviewed Diet and exercise encouraged  Follow up plan: 6 months   Bari Learn, FNP

## 2024-04-01 NOTE — Patient Instructions (Signed)
 Hypertension, Adult High blood pressure (hypertension) is when the force of blood pumping through the arteries is too strong. The arteries are the blood vessels that carry blood from the heart throughout the body. Hypertension forces the heart to work harder to pump blood and may cause arteries to become narrow or stiff. Untreated or uncontrolled hypertension can lead to a heart attack, heart failure, a stroke, kidney disease, and other problems. A blood pressure reading consists of a higher number over a lower number. Ideally, your blood pressure should be below 120/80. The first ("top") number is called the systolic pressure. It is a measure of the pressure in your arteries as your heart beats. The second ("bottom") number is called the diastolic pressure. It is a measure of the pressure in your arteries as the heart relaxes. What are the causes? The exact cause of this condition is not known. There are some conditions that result in high blood pressure. What increases the risk? Certain factors may make you more likely to develop high blood pressure. Some of these risk factors are under your control, including: Smoking. Not getting enough exercise or physical activity. Being overweight. Having too much fat, sugar, calories, or salt (sodium) in your diet. Drinking too much alcohol. Other risk factors include: Having a personal history of heart disease, diabetes, high cholesterol, or kidney disease. Stress. Having a family history of high blood pressure and high cholesterol. Having obstructive sleep apnea. Age. The risk increases with age. What are the signs or symptoms? High blood pressure may not cause symptoms. Very high blood pressure (hypertensive crisis) may cause: Headache. Fast or irregular heartbeats (palpitations). Shortness of breath. Nosebleed. Nausea and vomiting. Vision changes. Severe chest pain, dizziness, and seizures. How is this diagnosed? This condition is diagnosed by  measuring your blood pressure while you are seated, with your arm resting on a flat surface, your legs uncrossed, and your feet flat on the floor. The cuff of the blood pressure monitor will be placed directly against the skin of your upper arm at the level of your heart. Blood pressure should be measured at least twice using the same arm. Certain conditions can cause a difference in blood pressure between your right and left arms. If you have a high blood pressure reading during one visit or you have normal blood pressure with other risk factors, you may be asked to: Return on a different day to have your blood pressure checked again. Monitor your blood pressure at home for 1 week or longer. If you are diagnosed with hypertension, you may have other blood or imaging tests to help your health care provider understand your overall risk for other conditions. How is this treated? This condition is treated by making healthy lifestyle changes, such as eating healthy foods, exercising more, and reducing your alcohol intake. You may be referred for counseling on a healthy diet and physical activity. Your health care provider may prescribe medicine if lifestyle changes are not enough to get your blood pressure under control and if: Your systolic blood pressure is above 130. Your diastolic blood pressure is above 80. Your personal target blood pressure may vary depending on your medical conditions, your age, and other factors. Follow these instructions at home: Eating and drinking  Eat a diet that is high in fiber and potassium, and low in sodium, added sugar, and fat. An example of this eating plan is called the DASH diet. DASH stands for Dietary Approaches to Stop Hypertension. To eat this way: Eat  plenty of fresh fruits and vegetables. Try to fill one half of your plate at each meal with fruits and vegetables. Eat whole grains, such as whole-wheat pasta, brown rice, or whole-grain bread. Fill about one  fourth of your plate with whole grains. Eat or drink low-fat dairy products, such as skim milk or low-fat yogurt. Avoid fatty cuts of meat, processed or cured meats, and poultry with skin. Fill about one fourth of your plate with lean proteins, such as fish, chicken without skin, beans, eggs, or tofu. Avoid pre-made and processed foods. These tend to be higher in sodium, added sugar, and fat. Reduce your daily sodium intake. Many people with hypertension should eat less than 1,500 mg of sodium a day. Do not drink alcohol if: Your health care provider tells you not to drink. You are pregnant, may be pregnant, or are planning to become pregnant. If you drink alcohol: Limit how much you have to: 0-1 drink a day for women. 0-2 drinks a day for men. Know how much alcohol is in your drink. In the [place] one drink equals one 12 oz bottle of beer (355 mL), one 5 oz glass of wine (148 mL), or one 1 oz glass of hard liquor (44 mL). Lifestyle  Work with your health care provider to maintain a healthy body weight or to lose weight. Ask what an ideal weight is for you. Get at least 30 minutes of exercise that causes your heart to beat faster (aerobic exercise) most days of the week. Activities may include walking, swimming, or biking. Include exercise to strengthen your muscles (resistance exercise), such as Pilates or lifting weights, as part of your weekly exercise routine. Try to do these types of exercises for 30 minutes at least 3 days a week. Do not use any products that contain nicotine or tobacco. These products include cigarettes, chewing tobacco, and vaping devices, such as e-cigarettes. If you need help quitting, ask your health care provider. Monitor your blood pressure at home as told by your health care provider. Keep all follow-up visits. This is important. Medicines Take over-the-counter and prescription medicines only as told by your health care provider. Follow directions carefully. Blood  pressure medicines must be taken as prescribed. Do not skip doses of blood pressure medicine. Doing this puts you at risk for problems and can make the medicine less effective. Ask your health care provider about side effects or reactions to medicines that you should watch for. Contact a health care provider if you: Think you are having a reaction to a medicine you are taking. Have headaches that keep coming back (recurring). Feel dizzy. Have swelling in your ankles. Have trouble with your vision. Get help right away if you: Develop a severe headache or confusion. Have unusual weakness or numbness. Feel faint. Have severe pain in your chest or abdomen. Vomit repeatedly. Have trouble breathing. These symptoms may be an emergency. Get help right away. Call 911. Do not wait to see if the symptoms will go away. Do not drive yourself to the hospital. Summary Hypertension is when the force of blood pumping through your arteries is too strong. If this condition is not controlled, it may put you at risk for serious complications. Your personal target blood pressure may vary depending on your medical conditions, your age, and other factors. For most people, a normal blood pressure is less than 120/80. Hypertension is treated with lifestyle changes, medicines, or a combination of both. Lifestyle changes include losing weight, eating a healthy,  low-sodium diet, exercising more, and limiting alcohol. This information is not intended to replace advice given to you by your health care provider. Make sure you discuss any questions you have with your health care provider. Document Revised: 03/19/2021 Document Reviewed: 03/19/2021 Elsevier Patient Education  2024 [employer].

## 2024-04-02 LAB — CMP14+EGFR
ALT: 16 IU/L (ref 0–32)
AST: 14 IU/L (ref 0–40)
Albumin: 4.3 g/dL (ref 3.7–4.7)
Alkaline Phosphatase: 81 IU/L (ref 48–129)
BUN/Creatinine Ratio: 14 (ref 12–28)
BUN: 11 mg/dL (ref 8–27)
Bilirubin Total: 0.8 mg/dL (ref 0.0–1.2)
CO2: 26 mmol/L (ref 20–29)
Calcium: 9.7 mg/dL (ref 8.7–10.3)
Chloride: 98 mmol/L (ref 96–106)
Creatinine, Ser: 0.78 mg/dL (ref 0.57–1.00)
Globulin, Total: 2.2 g/dL (ref 1.5–4.5)
Glucose: 89 mg/dL (ref 70–99)
Potassium: 4.2 mmol/L (ref 3.5–5.2)
Sodium: 136 mmol/L (ref 134–144)
Total Protein: 6.5 g/dL (ref 6.0–8.5)
eGFR: 76 mL/min/1.73 (ref >59)

## 2024-04-02 LAB — LIPID PANEL
Chol/HDL Ratio: 2.8 ratio (ref 0.0–4.4)
Cholesterol, Total: 210 mg/dL — ABNORMAL HIGH (ref 100–199)
HDL: 74 mg/dL (ref >39)
LDL Chol Calc (NIH): 124 mg/dL — ABNORMAL HIGH (ref 0–99)
Triglycerides: 68 mg/dL (ref 0–149)
VLDL Cholesterol Cal: 12 mg/dL (ref 5–40)

## 2024-04-04 ENCOUNTER — Ambulatory Visit: Payer: Self-pay | Admitting: Family

## 2024-05-06 DIAGNOSIS — K639 Disease of intestine, unspecified: Secondary | ICD-10-CM | POA: Diagnosis not present

## 2024-05-06 DIAGNOSIS — Z1211 Encounter for screening for malignant neoplasm of colon: Secondary | ICD-10-CM | POA: Diagnosis not present

## 2024-05-16 ENCOUNTER — Other Ambulatory Visit: Payer: Self-pay | Admitting: Family

## 2024-05-16 DIAGNOSIS — K219 Gastro-esophageal reflux disease without esophagitis: Secondary | ICD-10-CM

## 2024-05-30 NOTE — Telephone Encounter (Unsigned)
 Copied from CRM [id number]. Topic: Clinical - Prescription Issue >> May 30, 2024 10:05 AM Chiquita SQUIBB wrote: Reason for CRM: Patient is calling in stating that she was told her refill for omeprazole  (PRILOSEC) 20 MG capsule was denied due to needing an appointment but the patient is scheduled for 07/18/2024. Please advise the patient on the status of this refill.

## 2024-07-18 ENCOUNTER — Ambulatory Visit: Payer: Self-pay | Admitting: Family

## 2024-09-27 ENCOUNTER — Encounter

## 2024-10-04 ENCOUNTER — Ambulatory Visit
